# Patient Record
Sex: Male | Born: 1970 | Race: Black or African American | Hispanic: No | Marital: Married | Smoking: Never smoker
Health system: Southern US, Community
[De-identification: ages and names within clinical notes are randomized; demographics above are authoritative.]

## PROBLEM LIST (undated history)

## (undated) DIAGNOSIS — N4601 Organic azoospermia: Secondary | ICD-10-CM

## (undated) DIAGNOSIS — Z973 Presence of spectacles and contact lenses: Secondary | ICD-10-CM

## (undated) DIAGNOSIS — E119 Type 2 diabetes mellitus without complications: Secondary | ICD-10-CM

## (undated) DIAGNOSIS — E291 Testicular hypofunction: Secondary | ICD-10-CM

## (undated) DIAGNOSIS — I10 Essential (primary) hypertension: Secondary | ICD-10-CM

## (undated) HISTORY — PX: BREAST REDUCTION SURGERY: SHX8

---

## 2009-09-25 HISTORY — PX: COLONOSCOPY: SHX174

## 2011-05-01 DIAGNOSIS — E119 Type 2 diabetes mellitus without complications: Secondary | ICD-10-CM | POA: Insufficient documentation

## 2011-09-09 ENCOUNTER — Encounter: Payer: Self-pay | Admitting: Emergency Medicine

## 2011-09-09 ENCOUNTER — Emergency Department (HOSPITAL_COMMUNITY)
Admission: EM | Admit: 2011-09-09 | Discharge: 2011-09-09 | Disposition: A | Discharge status: Home / Self Care | Payer: BC Managed Care – PPO | Attending: Emergency Medicine | Admitting: Emergency Medicine

## 2011-09-09 DIAGNOSIS — M25519 Pain in unspecified shoulder: Secondary | ICD-10-CM | POA: Insufficient documentation

## 2011-09-09 DIAGNOSIS — IMO0001 Reserved for inherently not codable concepts without codable children: Secondary | ICD-10-CM | POA: Insufficient documentation

## 2011-09-09 DIAGNOSIS — Z862 Personal history of diseases of the blood and blood-forming organs and certain disorders involving the immune mechanism: Secondary | ICD-10-CM | POA: Insufficient documentation

## 2011-09-09 DIAGNOSIS — Z8639 Personal history of other endocrine, nutritional and metabolic disease: Secondary | ICD-10-CM | POA: Insufficient documentation

## 2011-09-09 DIAGNOSIS — I10 Essential (primary) hypertension: Secondary | ICD-10-CM | POA: Insufficient documentation

## 2011-09-09 DIAGNOSIS — E119 Type 2 diabetes mellitus without complications: Secondary | ICD-10-CM | POA: Insufficient documentation

## 2011-09-09 DIAGNOSIS — M546 Pain in thoracic spine: Secondary | ICD-10-CM | POA: Insufficient documentation

## 2011-09-09 DIAGNOSIS — M7918 Myalgia, other site: Secondary | ICD-10-CM

## 2011-09-09 HISTORY — DX: Essential (primary) hypertension: I10

## 2011-09-09 MED ORDER — IBUPROFEN 800 MG PO TABS
800.0000 mg | ORAL_TABLET | Freq: Once | ORAL | Status: AC
  Administered 2011-09-09: 800 mg via ORAL
  Filled 2011-09-09: qty 1

## 2011-09-09 MED ORDER — CYCLOBENZAPRINE HCL 10 MG PO TABS
10.0000 mg | ORAL_TABLET | Freq: Once | ORAL | Status: AC
  Administered 2011-09-09: 10 mg via ORAL
  Filled 2011-09-09: qty 1

## 2011-09-09 MED ORDER — OXYCODONE-ACETAMINOPHEN 5-325 MG PO TABS
1.0000 | ORAL_TABLET | Freq: Once | ORAL | Status: AC
  Administered 2011-09-09: 1 via ORAL
  Filled 2011-09-09: qty 1

## 2011-09-09 MED ORDER — CYCLOBENZAPRINE HCL 10 MG PO TABS: 10.0000 mg | ORAL_TABLET | Freq: Two times a day (BID) | ORAL | Status: AC | PRN

## 2011-09-09 NOTE — ED Provider Notes (Signed)
History     CSN: [id number] Arrival date & time: 09/09/2011 10:19 PM   First MD Initiated Contact with Patient 09/09/11 2232      Chief Complaint  Patient presents with  . Back Pain    (Consider location/radiation/quality/duration/timing/severity/associated sxs/prior treatment) Patient is a 40 y.o. male presenting with back pain. The history is provided by the patient. No language interpreter was used.  Back Pain  This is a new problem. The current episode started 12 to 24 hours ago. The problem occurs constantly. The problem has not changed since onset.Pain location: L scapula. The quality of the pain is described as cramping and aching. The pain does not radiate. The pain is at a severity of 7/10. The pain is moderate. The symptoms are aggravated by certain positions. The pain is the same all the time. Pertinent negatives include no chest pain, no fever, no numbness, no paresthesias, no paresis and no weakness. He has tried NSAIDs for the symptoms. Risk factors include obesity.   Reports muscle spasm to his left scapula upon awakening this morning. Has tried ibuprofen 800 mg and 1 narcotic pain pill no relief. Palpable muscle spasm to the area. ` Past Medical History  Diagnosis Date  . Hypertension   . Diabetes mellitus   . Obesity   . Gout     History reviewed. No pertinent past surgical history.  No family history on file.  History  Substance Use Topics  . Smoking status: Never Smoker   . Smokeless tobacco: Not on file  . Alcohol Use: No      Review of Systems  Constitutional: Negative for fever.  Cardiovascular: Negative for chest pain.  Musculoskeletal: Positive for back pain.  Neurological: Negative for weakness, numbness and paresthesias.  All other systems reviewed and are negative.    Allergies  Review of patient's allergies indicates no known allergies.  Home Medications  No current outpatient prescriptions on file.  BP 195/95  Pulse 86  Temp(Src)  98.5 F (36.9 C) (Oral)  Resp 12  SpO2 100%  Physical Exam  Nursing note and vitals reviewed. Constitutional: He appears well-developed and well-nourished.  Eyes: Pupils are equal, round, and reactive to light.  Neck: Normal range of motion.  Cardiovascular: Normal rate.   Pulmonary/Chest: Effort normal and breath sounds normal.  Abdominal: Soft.  Musculoskeletal: He exhibits tenderness. He exhibits no edema.       Muscle spasm to L scapula area  Skin: Skin is warm and dry. No rash noted. No erythema.  Psychiatric: He has a normal mood and affect.    ED Course  Procedures (including critical care time)  Labs Reviewed - No data to display No results found.   No diagnosis found.    MDM  Treated for musculoskeletal pain to L scapula with percocet, ice, flexeril rx and ibuprofen. Visible spasm.  Woke that way     Jethro Bastos, NP 09/10/11 [phone]

## 2011-09-09 NOTE — ED Notes (Signed)
I gave the patient a large ice pack. 

## 2011-09-09 NOTE — ED Notes (Signed)
I gave the patient a warm blanket. 

## 2011-09-09 NOTE — ED Notes (Signed)
PT. REPORTS LEFT UPPER BACK PAIN ONSET THIS MORNING , DENIES INJURY OR FALL , SLIGHT SOB .

## 2011-09-09 NOTE — ED Notes (Signed)
Pt reports waking up this morning with (L) mid-back pain and (L) shoulder pain.  Pt tender on palpation.  ROM intact.  Denies injury.  Skin warm, dry and intact.  Neuro intact.

## 2011-09-10 NOTE — ED Provider Notes (Signed)
Medical screening examination/treatment/procedure(s) were performed by non-physician practitioner and as supervising physician I was immediately available for consultation/collaboration.  Donnetta Hutching, MD 09/10/11 [phone]

## 2011-09-30 ENCOUNTER — Ambulatory Visit (INDEPENDENT_AMBULATORY_CARE_PROVIDER_SITE_OTHER): Payer: BC Managed Care – PPO

## 2011-09-30 DIAGNOSIS — R609 Edema, unspecified: Secondary | ICD-10-CM

## 2011-09-30 DIAGNOSIS — IMO0001 Reserved for inherently not codable concepts without codable children: Secondary | ICD-10-CM

## 2011-09-30 DIAGNOSIS — M109 Gout, unspecified: Secondary | ICD-10-CM

## 2012-02-05 DIAGNOSIS — M109 Gout, unspecified: Secondary | ICD-10-CM | POA: Insufficient documentation

## 2012-05-02 DIAGNOSIS — G629 Polyneuropathy, unspecified: Secondary | ICD-10-CM | POA: Insufficient documentation

## 2013-06-25 ENCOUNTER — Ambulatory Visit: Payer: BC Managed Care – PPO

## 2013-06-25 ENCOUNTER — Ambulatory Visit (INDEPENDENT_AMBULATORY_CARE_PROVIDER_SITE_OTHER): Payer: BC Managed Care – PPO | Admitting: Family Medicine

## 2013-06-25 VITALS — BP 128/78 | HR 71 | Temp 98.3°F | Resp 16 | Ht 71.0 in | Wt 302.0 lb

## 2013-06-25 DIAGNOSIS — M25432 Effusion, left wrist: Secondary | ICD-10-CM

## 2013-06-25 DIAGNOSIS — M25532 Pain in left wrist: Secondary | ICD-10-CM

## 2013-06-25 DIAGNOSIS — M25439 Effusion, unspecified wrist: Secondary | ICD-10-CM

## 2013-06-25 DIAGNOSIS — E876 Hypokalemia: Secondary | ICD-10-CM

## 2013-06-25 DIAGNOSIS — M25539 Pain in unspecified wrist: Secondary | ICD-10-CM

## 2013-06-25 LAB — BASIC METABOLIC PANEL WITH GFR
Chloride: 96 meq/L (ref 96–112)
Creat: 1.08 mg/dL (ref 0.50–1.35)
Potassium: 3.2 meq/L — ABNORMAL LOW (ref 3.5–5.3)

## 2013-06-25 LAB — BASIC METABOLIC PANEL
BUN: 15 mg/dL (ref 6–23)
CO2: 29 mEq/L (ref 19–32)
Calcium: 9.6 mg/dL (ref 8.4–10.5)
Glucose, Bld: 165 mg/dL — ABNORMAL HIGH (ref 70–99)
Sodium: 137 mEq/L (ref 135–145)

## 2013-06-25 LAB — URIC ACID: Uric Acid, Serum: 6.5 mg/dL (ref 4.0–7.8)

## 2013-06-25 MED ORDER — HYDROCODONE-ACETAMINOPHEN 5-325 MG PO TABS: 1.0000 | ORAL_TABLET | Freq: Four times a day (QID) | ORAL | Status: DC | PRN

## 2013-06-25 NOTE — Progress Notes (Signed)
[facility]:  Office Visit  Chief Complaint:  Chief Complaint  Patient presents with  . Hand Injury    injured left wrist on Monday now swollen and hurting    HPI: Robert Brennan is a 42 y.o. male who is here for left wrist pain When he was getting out of bed Monday he bent it wrong and felt a loud pop, this episode left a bruise on the top of his hand, near wrist , + swelling, he can move his fingers but not his wirst due to pain.   Has been taking ibuprofen for pain it helps some and used ice on it last night to help with swelling he does have a HX of Gout . History of gout on right elbow. He takes colchicine every day, He did have seafood last Saturday, fish and clams. Denies alcohol use.  No prior injuries, he is ambidextrous. No weakness, but does have tingling  Last HgbA1c was under 6 No steroid use Last gouty attack was 1 year ago  Past Medical History  Diagnosis Date  . Hypertension   . Diabetes mellitus   . Obesity   . Gout    History reviewed. No pertinent past surgical history. History   Social History  . Marital Status: Married    Spouse Name: N/A    Number of Children: N/A  . Years of Education: N/A   Social History Main Topics  . Smoking status: Never Smoker   . Smokeless tobacco: None  . Alcohol Use: No  . Drug Use: No  . Sexual Activity: None   Other Topics Concern  . None   Social History Narrative  . None   Family History  Problem Relation Age of Onset  . Arthritis Mother   . Hypertension Mother   . Cancer Father    No Known Allergies Prior to Admission medications   Medication Sig Start Date End Date Taking? Authorizing Provider  atenolol-chlorthalidone (TENORETIC) 100-25 MG per tablet Take 1 tablet by mouth daily.   Yes Historical Provider, MD  colchicine 0.6 MG tablet Take 0.6 mg by mouth daily. Take two tablets at beginning of flare up then one tablet every eight hours until flare up is gone   Yes Historical Provider, MD   glucose blood test strip 1 each by Other route as needed for other. Use as instructed   Yes Historical Provider, MD  insulin glargine (LANTUS) 100 UNIT/ML injection Inject into the skin at bedtime. 64 units BID   Yes Historical Provider, MD  metFORMIN (GLUCOPHAGE-XR) 750 MG 24 hr tablet Take 750 mg by mouth daily with breakfast. Two tablets with dinner   Yes Historical Provider, MD  potassium chloride (K-DUR,KLOR-CON) 10 MEQ tablet Take 10 mEq by mouth daily.   Yes Historical Provider, MD  tadalafil (CIALIS) 5 MG tablet Take 5 mg by mouth daily as needed for erectile dysfunction. Take 1/2 tablet per day   Yes Historical Provider, MD     ROS: The patient denies fevers, chills, night sweats, unintentional weight loss, chest pain, palpitations, wheezing, dyspnea on exertion, nausea, vomiting, abdominal pain, dysuria, hematuria, melena, numbness or weakness, +  does have some tingling in his fingers and swelling of hand and wrist  All other systems have been reviewed and were otherwise negative with the exception of those mentioned in the HPI and as above.    PHYSICAL EXAM: Filed Vitals:   06/25/13 0946  BP: 128/78  Pulse: 71  Temp: 98.3 F (  36.8 C)  Resp: 16   Filed Vitals:   06/25/13 0946  Height: 5\' 11"  (1.803 m)  Weight: 302 lb (136.986 kg)   Body mass index is 42.14 kg/(m^2).  General: Alert, no acute distress HEENT:  Normocephalic, atraumatic, oropharynx patent. EOMI, PERRLA Cardiovascular:  Regular rate and rhythm, no rubs murmurs or gallops.  No Carotid bruits, radial pulse intact. No pedal edema.  Respiratory: Clear to auscultation bilaterally.  No wheezes, rales, or rhonchi.  No cyanosis, no use of accessory musculature GI: No organomegaly, abdomen is soft and non-tender, positive bowel sounds.  No masses. Skin: No rashes. Neurologic: Facial musculature symmetric. Psychiatric: Patient is appropriate throughout our interaction. Lymphatic: No cervical  lymphadenopathy Musculoskeletal: Gait intact. Left wrist - + warmth, decrease ROM in all directions due to pain + swelling + bruise, tender at proximal wrist but not MCP.    LABS: No results found for this or any previous visit.   EKG/XRAY:   Primary read interpreted by Dr. Conley Rolls at [facility]. ? Lucency at 3rd and 4th base proximal phalanx  Shadow vs fracture No e/o styloid fx   ASSESSMENT/PLAN: Encounter Diagnosis  Name Primary?  . Left wrist pain Yes   Most likely gout, less likely fracture Will get uric acid Rx Norco prn pain Advise to take 1.2 mg of cochicine and see how he does,  If not helpful and uric acid level is elvevated then I will rx him steroids.  He will get wrist brace and sling for comfort.  Will recheck BMP since he has a history of hypokalemia eventhough on supplemental potassium  Gross sideeffects, risk and benefits, and alternatives of medications d/w patient. Patient is aware that all medications have potential sideeffects and we are unable to predict every sideeffect or drug-drug interaction that may occur.  LE, THAO PHUONG, DO 06/25/2013 10:25 AM

## 2014-08-19 ENCOUNTER — Ambulatory Visit (INDEPENDENT_AMBULATORY_CARE_PROVIDER_SITE_OTHER): Payer: BC Managed Care – PPO | Admitting: Family Medicine

## 2014-08-19 ENCOUNTER — Ambulatory Visit (INDEPENDENT_AMBULATORY_CARE_PROVIDER_SITE_OTHER): Payer: BC Managed Care – PPO

## 2014-08-19 VITALS — BP 132/80 | HR 87 | Temp 98.9°F | Resp 18 | Ht 72.0 in | Wt 302.0 lb

## 2014-08-19 DIAGNOSIS — M25561 Pain in right knee: Secondary | ICD-10-CM

## 2014-08-19 MED ORDER — MELOXICAM 7.5 MG PO TABS: 7.5000 mg | ORAL_TABLET | Freq: Every day | ORAL | Status: DC

## 2014-08-19 NOTE — Patient Instructions (Signed)
Your xray of your right knee looked ok. It did show some arthritis in your knee.  You did not tear any of your 4 knee ligaments. It's possible you tore your medial meniscus a little bit.  Please wear the brace for the next 7-10 days. We've prescribed mobic which is a long acting anti inflammatory. Please take this once daily. If you are not feeling better in 7-10 days, please come back to clinic to be evaluated again. We would probably either schedule an appt with ortho or schedule you for an mri from here.

## 2014-08-19 NOTE — Progress Notes (Signed)
Subjective:    Patient ID: Robert Brennan, male    DOB: 07/05/1971, 43 y.o.   MRN: [id number]  PCP: No primary care provider on file.  Chief Complaint  Patient presents with  . Knee Pain    rt-1.5 weeks    There are no active problems to display for this patient.  Prior to Admission medications   Medication Sig Start Date End Date Taking? Authorizing Provider  amLODipine-benazepril (LOTREL) 10-40 MG per capsule Take by mouth. 07/31/14  Yes Historical Provider, MD  aspirin 81 MG chewable tablet Chew by mouth. 12/01/09  Yes Historical Provider, MD  atenolol-chlorthalidone (TENORETIC) 100-25 MG per tablet Take 1 tablet by mouth daily.   Yes Historical Provider, MD  colchicine 0.6 MG tablet Take 0.6 mg by mouth daily. Take two tablets at beginning of flare up then one tablet every eight hours until flare up is gone   Yes Historical Provider, MD  glucose blood test strip 1 each by Other route as needed for other. Use as instructed   Yes Historical Provider, MD  insulin glargine (LANTUS) 100 UNIT/ML injection Inject into the skin at bedtime. 64 units BID   Yes Historical Provider, MD  metFORMIN (GLUCOPHAGE-XR) 750 MG 24 hr tablet Take 750 mg by mouth daily with breakfast. Two tablets with dinner   Yes Historical Provider, MD  potassium chloride (K-DUR,KLOR-CON) 10 MEQ tablet Take 10 mEq by mouth daily.   Yes Historical Provider, MD  tadalafil (CIALIS) 5 MG tablet Take 5 mg by mouth daily as needed for erectile dysfunction. Take 1/2 tablet per day   Yes Historical Provider, MD  allopurinol (ZYLOPRIM) 300 MG tablet Take by mouth. 12/12/13   Historical Provider, MD   Medications, allergies, past medical history, surgical history, family history, social history and problem list reviewed and updated.  HPI  [age] yom with PMH gout in right wrist last year presents with 1.5 wk h/o right knee pain.  Pain started 1.5 wks ago. He is a Runner, broadcasting/film/videoteacher, was walking at work and tripped going up stairs and landed on  medial right knee. It hurt initially but resolved quickly. A couple days later he noticed some medial swelling. He took ibuprofen which helped. Pain has persisted since the fall. It is typically pretty mild but when he walks or lies down to sleep and stretches it out the pain worsens. 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 He thinks he may have also twisted the right knee a bit while wrestling a few days ago.   No fevers, chills. No prev knee hx.   Review of Systems No CP, SOB.     Objective:   Physical Exam  Constitutional: He appears well-developed and well-nourished.  Non-toxic appearance. He does not have a sickly appearance. He does not appear ill. No distress.  BP 132/80 mmHg  Pulse 87  Temp(Src) 98.9 F (37.2 C) (Oral)  Resp 18  Ht 6' (1.829 m)  Wt 302 lb (136.986 kg)  BMI 40.95 kg/m2  SpO2 98%   Musculoskeletal:       Right knee: He exhibits swelling, effusion and abnormal meniscus. He exhibits normal range of motion, no ecchymosis, no LCL laxity and no MCL laxity. Tenderness found. Medial joint line tenderness noted.  TTP medial joint line. Slight swelling medial joint line. Crepitus with knee extension. Medial effusion. Neg mcl/lcl laxity. Neg ant/post drawer. Medial pain with McMurrays. Normal sensation. Normal strength. 2+  DP pulse. No erythema. No warmth.    [facility] reading (PRIMARY) by  Dr. Neva SeatGreene. Findings: Degenerative changes medial greater than lateral. No apparent fx.      Assessment & Plan:   [age] yom with PMH gout in right wrist last year presents with 1.5 wk h/o right knee pain.  Right medial knee pain - Plan: DG Knee Complete 4 Views Right, meloxicam (MOBIC) 7.5 MG tablet --OA on xr --Neg testing for ligament damage --Pain could be related to OA flare after fall, knee contusion, or possible meniscus tear in light of positive testing --Brace applied, mobic, rest 7-10 days --If these measures don't help pain, rtc  7-10 days. MRI vs ortho referral  Donnajean Lopesodd M. Nikia Levels, PA-C Physician Assistant-Certified [facility]  08/19/2014 6:53 PM

## 2014-08-20 NOTE — Progress Notes (Signed)
Xray read and patient discussed as well as examined with Mr. Agustin CreeMcVeigh. Agree with assessment and plan of care per his note.

## 2015-08-15 ENCOUNTER — Encounter: Payer: Self-pay | Admitting: Emergency Medicine

## 2015-08-15 ENCOUNTER — Emergency Department
Admission: EM | Admit: 2015-08-15 | Discharge: 2015-08-15 | Disposition: A | Discharge status: Home / Self Care | Payer: BLUE CROSS/BLUE SHIELD | Attending: Emergency Medicine | Admitting: Emergency Medicine

## 2015-08-15 DIAGNOSIS — Z794 Long term (current) use of insulin: Secondary | ICD-10-CM | POA: Insufficient documentation

## 2015-08-15 DIAGNOSIS — R350 Frequency of micturition: Secondary | ICD-10-CM | POA: Insufficient documentation

## 2015-08-15 DIAGNOSIS — Z7982 Long term (current) use of aspirin: Secondary | ICD-10-CM | POA: Insufficient documentation

## 2015-08-15 DIAGNOSIS — Z791 Long term (current) use of non-steroidal anti-inflammatories (NSAID): Secondary | ICD-10-CM | POA: Diagnosis not present

## 2015-08-15 DIAGNOSIS — Z79899 Other long term (current) drug therapy: Secondary | ICD-10-CM | POA: Diagnosis not present

## 2015-08-15 DIAGNOSIS — E1165 Type 2 diabetes mellitus with hyperglycemia: Secondary | ICD-10-CM | POA: Insufficient documentation

## 2015-08-15 DIAGNOSIS — I10 Essential (primary) hypertension: Secondary | ICD-10-CM | POA: Insufficient documentation

## 2015-08-15 LAB — URINALYSIS COMPLETE WITH MICROSCOPIC (ARMC ONLY)
BILIRUBIN URINE: NEGATIVE
Bacteria, UA: NONE SEEN
HGB URINE DIPSTICK: NEGATIVE
Leukocytes, UA: NEGATIVE
NITRITE: NEGATIVE
Protein, ur: NEGATIVE mg/dL
RBC / HPF: NONE SEEN RBC/hpf (ref 0–5)
SPECIFIC GRAVITY, URINE: 1.029 (ref 1.005–1.030)
pH: 6 (ref 5.0–8.0)

## 2015-08-15 LAB — CBC
HCT: 46.1 % (ref 40.0–52.0)
Hemoglobin: 15.6 g/dL (ref 13.0–18.0)
MCH: 29.1 pg (ref 26.0–34.0)
MCHC: 33.8 g/dL (ref 32.0–36.0)
MCV: 85.9 fL (ref 80.0–100.0)
PLATELETS: 191 10*3/uL (ref 150–440)
RBC: 5.36 MIL/uL (ref 4.40–5.90)
RDW: 12.6 % (ref 11.5–14.5)
WBC: 6.3 10*3/uL (ref 3.8–10.6)

## 2015-08-15 LAB — GLUCOSE, CAPILLARY
GLUCOSE-CAPILLARY: 555 mg/dL — AB (ref 65–99)
GLUCOSE-CAPILLARY: 337 mg/dL — AB (ref 65–99)
Glucose-Capillary: 363 mg/dL — ABNORMAL HIGH (ref 65–99)

## 2015-08-15 LAB — BASIC METABOLIC PANEL
ANION GAP: 12 (ref 5–15)
BUN: 20 mg/dL (ref 6–20)
CALCIUM: 9.3 mg/dL (ref 8.9–10.3)
CO2: 25 mmol/L (ref 22–32)
CREATININE: 1.31 mg/dL — AB (ref 0.61–1.24)
Chloride: 92 mmol/L — ABNORMAL LOW (ref 101–111)
GFR calc Af Amer: >60 mL/min (ref >60)
GLUCOSE: 596 mg/dL — AB (ref 65–99)
Potassium: 3.5 mmol/L (ref 3.5–5.1)
Sodium: 129 mmol/L — ABNORMAL LOW (ref 135–145)

## 2015-08-15 MED ORDER — INSULIN ASPART 100 UNIT/ML ~~LOC~~ SOLN
6.0000 [IU] | Freq: Once | SUBCUTANEOUS | Status: AC
  Administered 2015-08-15: 6 [IU] via INTRAVENOUS
  Filled 2015-08-15: qty 6

## 2015-08-15 MED ORDER — SODIUM CHLORIDE 0.9 % IV BOLUS (SEPSIS)
1000.0000 mL | Freq: Once | INTRAVENOUS | Status: AC
  Administered 2015-08-15: 1000 mL via INTRAVENOUS

## 2015-08-15 MED ORDER — GLIPIZIDE 10 MG PO TABS: 10.0000 mg | ORAL_TABLET | Freq: Every day | ORAL | Status: DC

## 2015-08-15 NOTE — ED Provider Notes (Signed)
[facility] Emergency Department Provider Note REMINDER - THIS NOTE IS NOT A FINAL MEDICAL RECORD UNTIL IT IS SIGNED. UNTIL THEN, THE CONTENT BELOW MAY REFLECT INFORMATION FROM A DOCUMENTATION TEMPLATE, NOT THE ACTUAL PATIENT VISIT. ____________________________________________  Time seen: Approximately 4:41 PM  I have reviewed the triage vital signs and the nursing notes.   HISTORY  Chief Complaint Hyperglycemia    HPI Treyce Spillers is a 44 y.o. male history type 2 diabetes, hypertension.  Patient reports that he is on metformin, but he ran out of prescription for glipizide 10 mg about 3 weeks ago because he had not seen his primary care doctor recently enough to have it refilled. Since that his blood sugars during the day of progressively increased to the point there are now registering "high" at home the last 2 days. Reports for the last 3 or so days he has been urinating frequently and feeling "dehydrated". He denies any fevers or chills. No chest pain or trouble breathing. No rash. No headache or confusion. He denies being in any pain.  He has set up appointment with his primary care doctor for tomorrow in follow-up.   Past Medical History  Diagnosis Date  . Hypertension   . Diabetes mellitus   . Obesity   . Gout     There are no active problems to display for this patient.   History reviewed. No pertinent past surgical history.  Current Outpatient Rx  Name  Route  Sig  Dispense  Refill  . allopurinol (ZYLOPRIM) 300 MG tablet   Oral   Take by mouth.         Marland Kitchen amLODipine-benazepril (LOTREL) 10-40 MG per capsule   Oral   Take by mouth.         Marland Kitchen aspirin 81 MG chewable tablet   Oral   Chew by mouth.         Marland Kitchen atenolol-chlorthalidone (TENORETIC) 100-25 MG per tablet   Oral   Take 1 tablet by mouth daily.         . colchicine 0.6 MG tablet   Oral   Take 0.6 mg by mouth daily. Take two tablets at beginning of flare up then one tablet  every eight hours until flare up is gone         . glipiZIDE (GLUCOTROL) 10 MG tablet   Oral   Take 1 tablet (10 mg total) by mouth daily.   14 tablet   0   . glucose blood test strip   Other   1 each by Other route as needed for other. Use as instructed         . insulin glargine (LANTUS) 100 UNIT/ML injection   Subcutaneous   Inject into the skin at bedtime. 64 units BID         . meloxicam (MOBIC) 7.5 MG tablet   Oral   Take 1 tablet (7.5 mg total) by mouth daily.   30 tablet   0   . metFORMIN (GLUCOPHAGE-XR) 750 MG 24 hr tablet   Oral   Take 750 mg by mouth daily with breakfast. Two tablets with dinner         . potassium chloride (K-DUR,KLOR-CON) 10 MEQ tablet   Oral   Take 10 mEq by mouth daily.         . tadalafil (CIALIS) 5 MG tablet   Oral   Take 5 mg by mouth daily as needed for erectile dysfunction. Take 1/2 tablet per day  Allergies Review of patient's allergies indicates no known allergies.  Family History  Problem Relation Age of Onset  . Arthritis Mother   . Hypertension Mother   . Cancer Father     Social History Social History  Substance Use Topics  . Smoking status: Never Smoker   . Smokeless tobacco: None  . Alcohol Use: No    Review of Systems Constitutional: No fever/chills Eyes: No visual changes. ENT: No sore throat. Cardiovascular: Denies chest pain. Respiratory: Denies shortness of breath. Gastrointestinal: No abdominal pain.  No nausea, no vomiting.  No diarrhea.  No constipation. Genitourinary: Negative for dysuria. He is urinating more frequently than usual. Musculoskeletal: Negative for back pain. Skin: Negative for rash. Neurological: Negative for headaches, focal weakness or numbness.  10-point ROS otherwise negative.  ____________________________________________   PHYSICAL EXAM:  VITAL SIGNS: ED Triage Vitals  Enc Vitals Group     BP 08/15/15 1331 141/80 mmHg     Pulse Rate 08/15/15 1330  100     Resp 08/15/15 1330 20     Temp 08/15/15 1331 98.8 F (37.1 C)     Temp Source 08/15/15 1331 Oral     SpO2 08/15/15 1330 93 %     Weight 08/15/15 1331 260 lb (117.935 kg)     Height 08/15/15 1330 6' (1.829 m)     Head Cir --      Peak Flow --      Pain Score --      Pain Loc --      Pain Edu? --      Excl. in GC? --    Constitutional: Alert and oriented. Well appearing and in no acute distress. Eyes: Conjunctivae are normal. PERRL. EOMI. Head: Atraumatic. Nose: No congestion/rhinnorhea. Mouth/Throat: Mucous membranes are moist.  Oropharynx non-erythematous. Neck: No stridor.   Cardiovascular: Normal rate, regular rhythm. Grossly normal heart sounds.  Good peripheral circulation. Respiratory: Normal respiratory effort.  No retractions. Lungs CTAB. Gastrointestinal: Soft and nontender. No distention. No abdominal bruits. No CVA tenderness. Musculoskeletal: No lower extremity tenderness nor edema.  No joint effusions. Neurologic:  Normal speech and language. No gross focal neurologic deficits are appreciated. No gait instability. Skin:  Skin is warm, dry and intact. No rash noted. Psychiatric: Mood and affect are normal. Speech and behavior are normal.  ____________________________________________   LABS (all labs ordered are listed, but only abnormal results are displayed)  Labs Reviewed  BASIC METABOLIC PANEL - Abnormal; Notable for the following:    Sodium 129 (*)    Chloride 92 (*)    Glucose, Bld 596 (*)    Creatinine, Ser 1.31 (*)    All other components within normal limits  URINALYSIS COMPLETEWITH MICROSCOPIC ([facility] ONLY) - Abnormal; Notable for the following:    Color, Urine STRAW (*)    APPearance CLEAR (*)    Glucose, UA >500 (*)    Ketones, ur TRACE (*)    Squamous Epithelial / LPF 0-5 (*)    All other components within normal limits  GLUCOSE, CAPILLARY - Abnormal; Notable for the following:    Glucose-Capillary 555 (*)    All other components within  normal limits  GLUCOSE, CAPILLARY - Abnormal; Notable for the following:    Glucose-Capillary 363 (*)    All other components within normal limits  CBC  CBG MONITORING, ED  CBG MONITORING, ED   ____________________________________________  EKG   ____________________________________________  RADIOLOGY   ____________________________________________   PROCEDURES  Procedure(s) performed: None  Critical Care  performed: No  ____________________________________________   INITIAL IMPRESSION / ASSESSMENT AND PLAN / ED COURSE  Pertinent labs & imaging results that were available during my care of the patient were reviewed by me and considered in my medical decision making (see chart for details).  Patient presents for hyperglycemia with polyuria and polydipsia. No signs or symptoms of tablet ketoacidosis or HHNK. Appears to be due to running out of his glipizide which she was unable to have his prescription refilled, but has set up follow-up for tomorrow with his doctor. He is in no distress, no evidence of acute infection. Neurologic intact. No cardia pulmonary symptoms. We will hydrate him, improve his sugar, and place him back on his glipizide. Once blood sugars below 300, as he has no anion gap elevation, we will plan to discharge the patient to have him follow up closely with his doctor. I will provide him a prescription for his glipizide for the next 2 weeks should he have any trouble getting to his doctor.  Patient understands the importance of follow-up, and careful return precautions. Dr. Lenard LancePaduchowski will follow-up as we continue to improve his blood sugar. Anticipate discharge to home once glucose improved. ____________________________________________   FINAL CLINICAL IMPRESSION(S) / ED DIAGNOSES  Final diagnoses:  Type 2 diabetes mellitus with hyperglycemia, without long-term current use of insulin (HCC)      Sharyn CreamerMark Hawkin Charo, MD 08/15/15 1645

## 2015-08-15 NOTE — ED Provider Notes (Signed)
-----------------------------------------   5:39 PM on 08/15/2015 -----------------------------------------  Patient's blood glucose currently 337. States he feels very well, wishes to go home at this time. We'll discharge the patient home with a short course of glipizide 10 mg tablets which the patient has been taking up until the last several weeks. Patient has a follow-up appointment tomorrow with his primary care doctor per patient. I discussed return precautions to which they're agreeable. We'll discharge him at this time.  Minna AntisKevin Devlin Mcveigh, MD 08/15/15 1739

## 2015-08-15 NOTE — ED Notes (Signed)
Pt reports high blood sugar X 1 week. Pt taken off of insulin pen and glipizide; has follow up with doctor this week. Pt currently taking metformin. Pt only sx are of excessive thirst. No vision changes, increase in urination.

## 2015-08-15 NOTE — ED Notes (Signed)
Patient resting comfortably. IV saline locked. Family at bedside. Will recheck sugar at 1715.

## 2015-08-15 NOTE — ED Notes (Signed)
Pt presents with c/o high blood sugar this am around 9, it was 448. Pt has appt tomorrow for med change but thought he should come in today.

## 2015-08-15 NOTE — Discharge Instructions (Signed)
Follow-up with your doctor tomorrow as you have already scheduled. Return to the emergency room right away if you develop confusion, nausea or vomiting, fever, weakness, feel dehydrated, or other new concerns arise.  Type 2 Diabetes Mellitus, Adult Type 2 diabetes mellitus, often simply referred to as type 2 diabetes, is a long-lasting (chronic) disease. In type 2 diabetes, the pancreas does not make enough insulin (a hormone), the cells are less responsive to the insulin that is made (insulin resistance), or both. Normally, insulin moves sugars from food into the tissue cells. The tissue cells use the sugars for energy. The lack of insulin or the lack of normal response to insulin causes excess sugars to build up in the blood instead of going into the tissue cells. As a result, high blood sugar (hyperglycemia) develops. The effect of high sugar (glucose) levels can cause many complications. Type 2 diabetes was also previously called adult-onset diabetes, but it can occur at any age.  RISK FACTORS  A person is predisposed to developing type 2 diabetes if someone in the family has the disease and also has one or more of the following primary risk factors:  Weight gain, or being overweight or obese.  An inactive lifestyle.  A history of consistently eating high-calorie foods. Maintaining a normal weight and regular physical activity can reduce the chance of developing type 2 diabetes. SYMPTOMS  A person with type 2 diabetes may not show symptoms initially. The symptoms of type 2 diabetes appear slowly. The symptoms include:  Increased thirst (polydipsia).  Increased urination (polyuria).  Increased urination during the night (nocturia).  Sudden or unexplained weight changes.  Frequent, recurring infections.  Tiredness (fatigue).  Weakness.  Vision changes, such as blurred vision.  Fruity smell to your breath.  Abdominal pain.  Nausea or vomiting.  Cuts or bruises which are  slow to heal.  Tingling or numbness in the hands or feet.  An open skin wound (ulcer). DIAGNOSIS Type 2 diabetes is frequently not diagnosed until complications of diabetes are present. Type 2 diabetes is diagnosed when symptoms or complications are present and when blood glucose levels are increased. Your blood glucose level may be checked by one or more of the following blood tests:  A fasting blood glucose test. You will not be allowed to eat for at least 8 hours before a blood sample is taken.  A random blood glucose test. Your blood glucose is checked at any time of the day regardless of when you ate.  A hemoglobin A1c blood glucose test. A hemoglobin A1c test provides information about blood glucose control over the previous 3 months.  An oral glucose tolerance test (OGTT). Your blood glucose is measured after you have not eaten (fasted) for 2 hours and then after you drink a glucose-containing beverage. TREATMENT   You may need to take insulin or diabetes medicine daily to keep blood glucose levels in the desired range.  If you use insulin, you may need to adjust the dosage depending on the carbohydrates that you eat with each meal or snack.  Lifestyle changes are recommended as part of your treatment. These may include:  Following an individualized diet plan developed by a nutritionist or dietitian.  Exercising daily. Your health care providers will set individualized treatment goals for you based on your age, your medicines, how long you have had diabetes, and any other medical conditions you have. Generally, the goal of treatment is to maintain the following blood glucose levels:  Before meals (preprandial):  80-130 mg/dL.  After meals (postprandial): below 180 mg/dL.  A1c: less than 6.5-7%. HOME CARE INSTRUCTIONS   Have your hemoglobin A1c level checked twice a year.  Perform daily blood glucose monitoring as directed by your health care provider.  Monitor urine  ketones when you are ill and as directed by your health care provider.  Take your diabetes medicine or insulin as directed by your health care provider to maintain your blood glucose levels in the desired range.  Never run out of diabetes medicine or insulin. It is needed every day.  If you are using insulin, you may need to adjust the amount of insulin given based on your intake of carbohydrates. Carbohydrates can raise blood glucose levels but need to be included in your diet. Carbohydrates provide vitamins, minerals, and fiber which are an essential part of a healthy diet. Carbohydrates are found in fruits, vegetables, whole grains, dairy products, legumes, and foods containing added sugars.  Eat healthy foods. You should make an appointment to see a registered dietitian to help you create an eating plan that is right for you.  Lose weight if you are overweight.  Carry a medical alert card or wear your medical alert jewelry.  Carry a 15-gram carbohydrate snack with you at all times to treat low blood glucose (hypoglycemia). Some examples of 15-gram carbohydrate snacks include:  Glucose tablets, 3 or 4.  Glucose gel, 15-gram tube.  Raisins, 2 tablespoons (24 grams).  Jelly beans, 6.  Animal crackers, 8.  Regular pop, 4 ounces (120 mL).  Gummy treats, 9.  Recognize hypoglycemia. Hypoglycemia occurs with blood glucose levels of 70 mg/dL and below. The risk for hypoglycemia increases when fasting or skipping meals, during or after intense exercise, and during sleep. Hypoglycemia symptoms can include:  Tremors or shakes.  Decreased ability to concentrate.  Sweating.  Increased heart rate.  Headache.  Dry mouth.  Hunger.  Irritability.  Anxiety.  Restless sleep.  Altered speech or coordination.  Confusion.  Treat hypoglycemia promptly. If you are alert and able to safely swallow, follow the 15:15 rule:  Take 15-20 grams of rapid-acting glucose or carbohydrate.  Rapid-acting options include glucose gel, glucose tablets, or 4 ounces (120 mL) of fruit juice, regular soda, or low-fat milk.  Check your blood glucose level 15 minutes after taking the glucose.  Take 15-20 grams more of glucose if the repeat blood glucose level is still 70 mg/dL or below.  Eat a meal or snack within 1 hour once blood glucose levels return to normal.  Be alert to feeling very thirsty and urinating more frequently than usual, which are early signs of hyperglycemia. An early awareness of hyperglycemia allows for prompt treatment. Treat hyperglycemia as directed by your health care provider.  Engage in at least 150 minutes of moderate-intensity physical activity a week, spread over at least 3 days of the week or as directed by your health care provider. In addition, you should engage in resistance exercise at least 2 times a week or as directed by your health care provider. Try to spend no more than 90 minutes at one time inactive.  Adjust your medicine and food intake as needed if you start a new exercise or sport.  Follow your sick-day plan anytime you are unable to eat or drink as usual.  Do not use any tobacco products including cigarettes, chewing tobacco, or electronic cigarettes. If you need help quitting, ask your health care provider.  Limit alcohol intake to no more than 1  drink per day for nonpregnant women and 2 drinks per day for men. You should drink alcohol only when you are also eating food. Talk with your health care provider whether alcohol is safe for you. Tell your health care provider if you drink alcohol several times a week.  Keep all follow-up visits as directed by your health care provider. This is important.  Schedule an eye exam soon after the diagnosis of type 2 diabetes and then annually.  Perform daily skin and foot care. Examine your skin and feet daily for cuts, bruises, redness, nail problems, bleeding, blisters, or sores. A foot exam by a  health care provider should be done annually.  Brush your teeth and gums at least twice a day and floss at least once a day. Follow up with your dentist regularly.  Share your diabetes management plan with your workplace or school.  Keep your immunizations up to date. It is recommended that you receive a flu (influenza) vaccine every year. It is also recommended that you receive a pneumonia (pneumococcal) vaccine. If you are 59 years of age or older and have never received a pneumonia vaccine, this vaccine may be given as a series of two separate shots. Ask your health care provider which additional vaccines may be recommended.  Learn to manage stress.  Obtain ongoing diabetes education and support as needed.  Participate in or seek rehabilitation as needed to maintain or improve independence and quality of life. Request a physical or occupational therapy referral if you are having foot or hand numbness, or difficulties with grooming, dressing, eating, or physical activity. SEEK MEDICAL CARE IF:   You are unable to eat food or drink fluids for more than 6 hours.  You have nausea and vomiting for more than 6 hours.  Your blood glucose level is over 240 mg/dL.  There is a change in mental status.  You develop an additional serious illness.  You have diarrhea for more than 6 hours.  You have been sick or have had a fever for a couple of days and are not getting better.  You have pain during any physical activity.  SEEK IMMEDIATE MEDICAL CARE IF:  You have difficulty breathing.  You have moderate to large ketone levels.   This information is not intended to replace advice given to you by your health care provider. Make sure you discuss any questions you have with your health care provider.   Document Released: 09/11/2005 Document Revised: 06/02/2015 Document Reviewed: 04/09/2012 Elsevier Interactive Patient Education [employer]

## 2016-01-10 IMAGING — CR DG KNEE COMPLETE 4+V*R*
4 series · 4 of 4 positions shown · non-contrast
Comparison: None.

CLINICAL DATA: Right medial knee pain Comments: Was walking at work
and tripped going up stairs and landed on right knee x 1 [DATE] week
ago

EXAM:
RIGHT KNEE - COMPLETE 4+ VIEW

[AP]
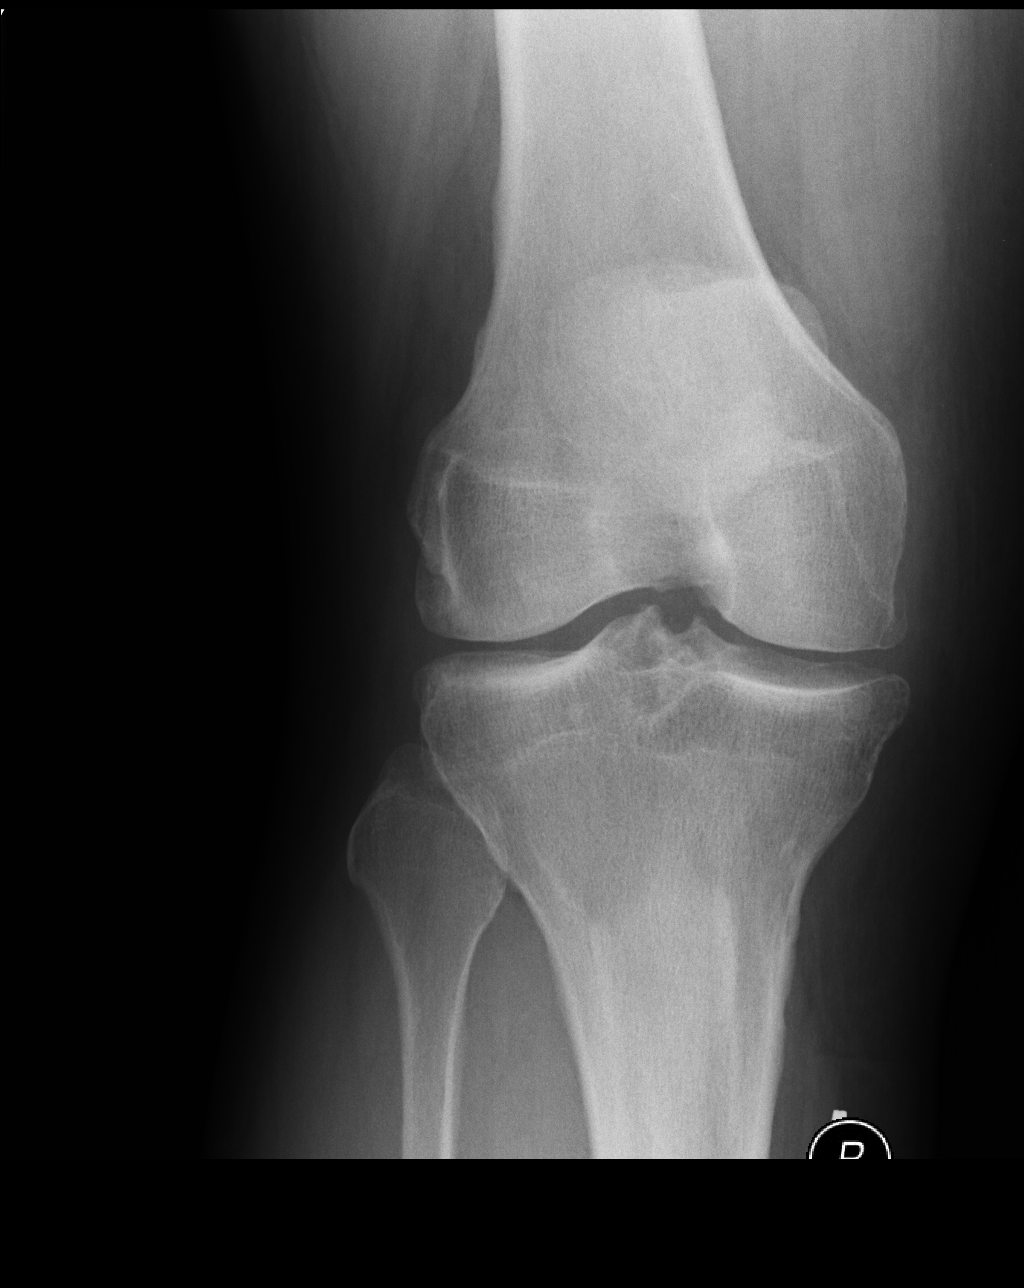

[lateral]
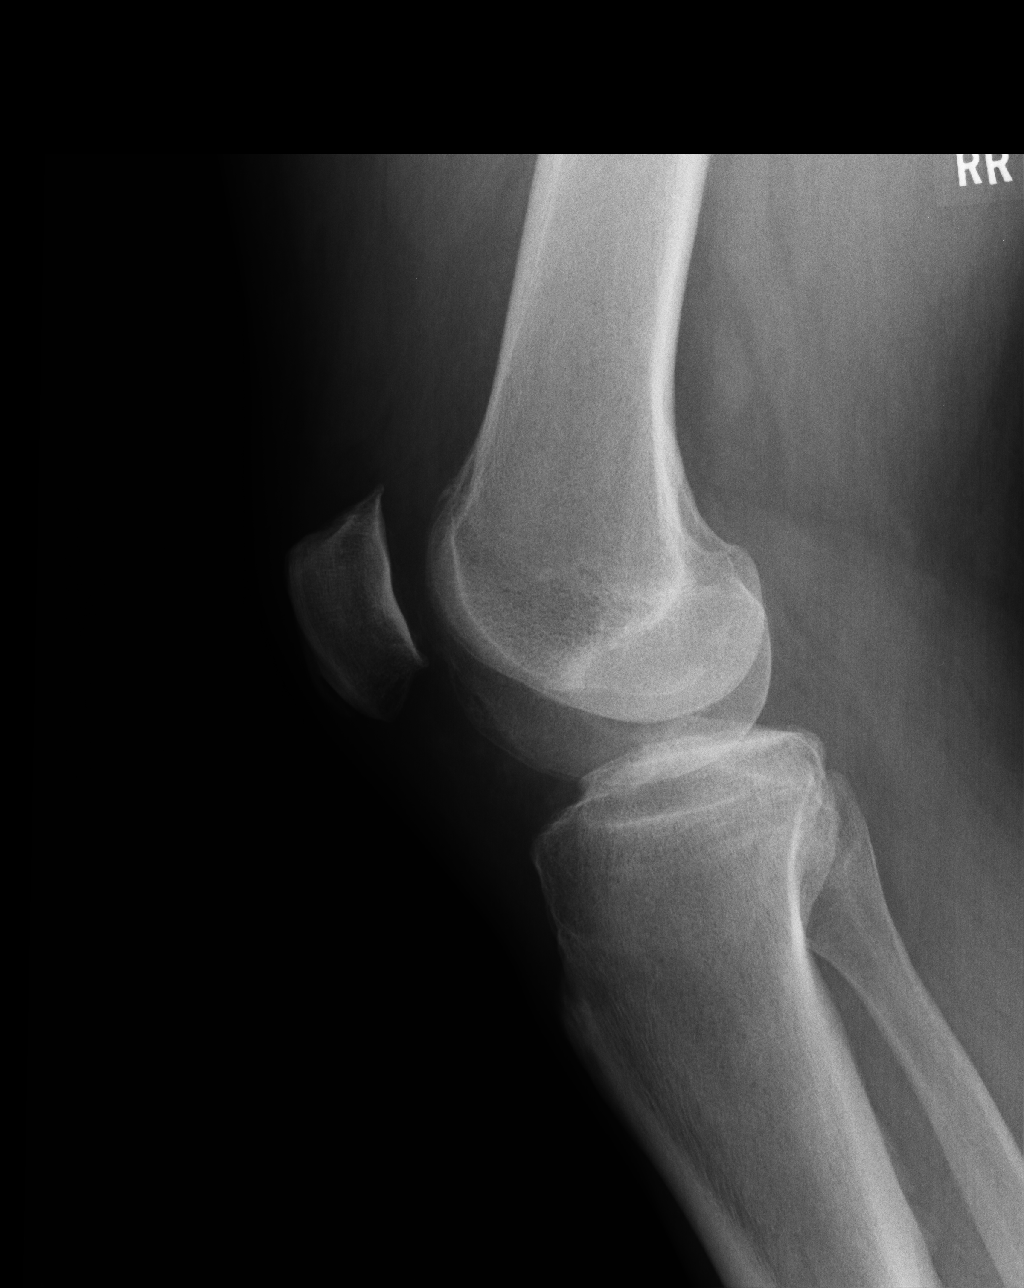

[ap axial]
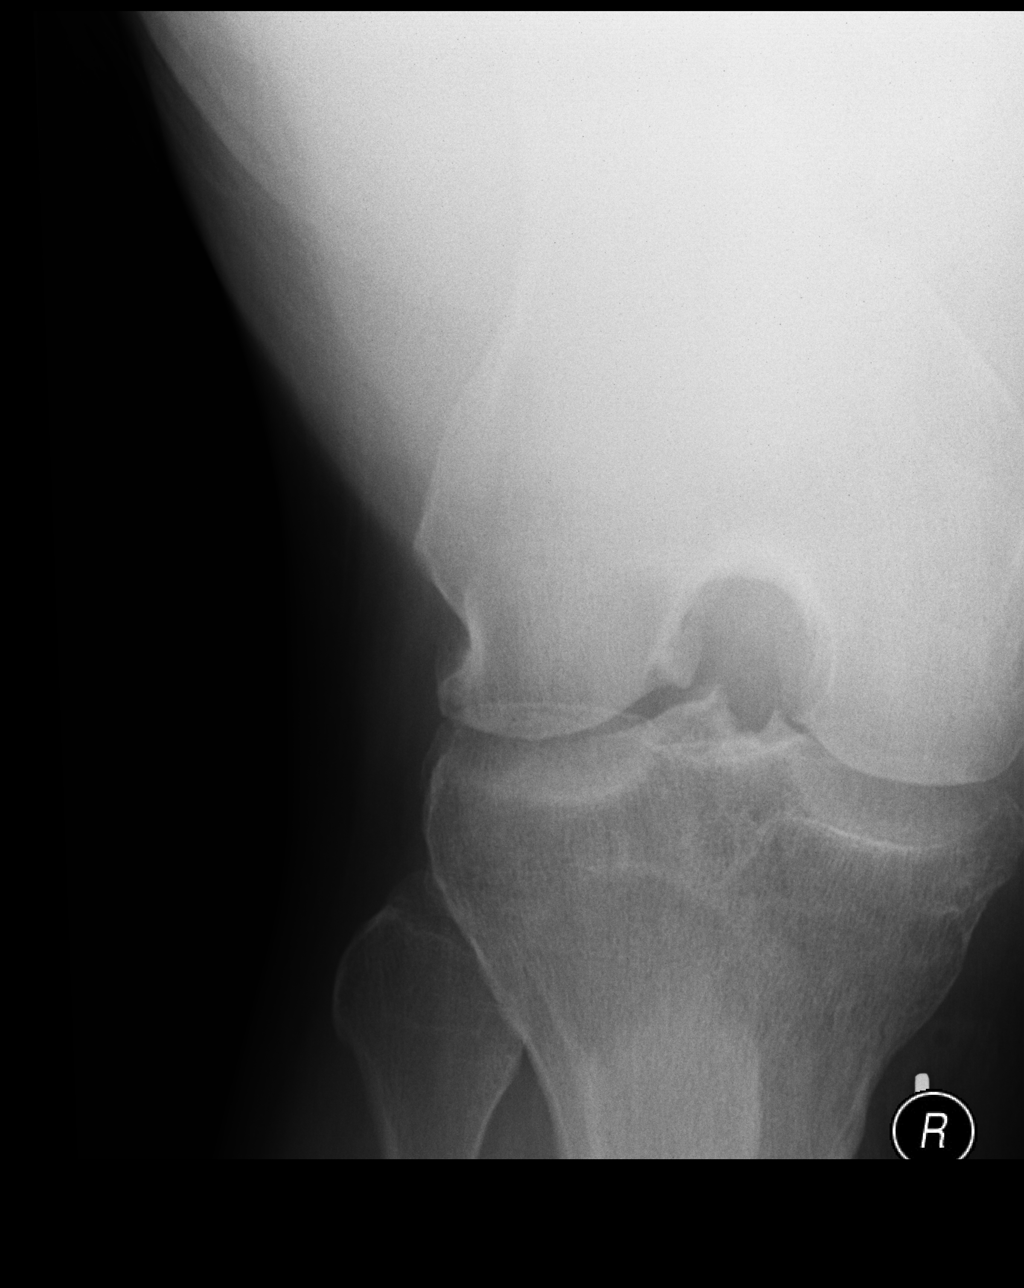

[sunrise]
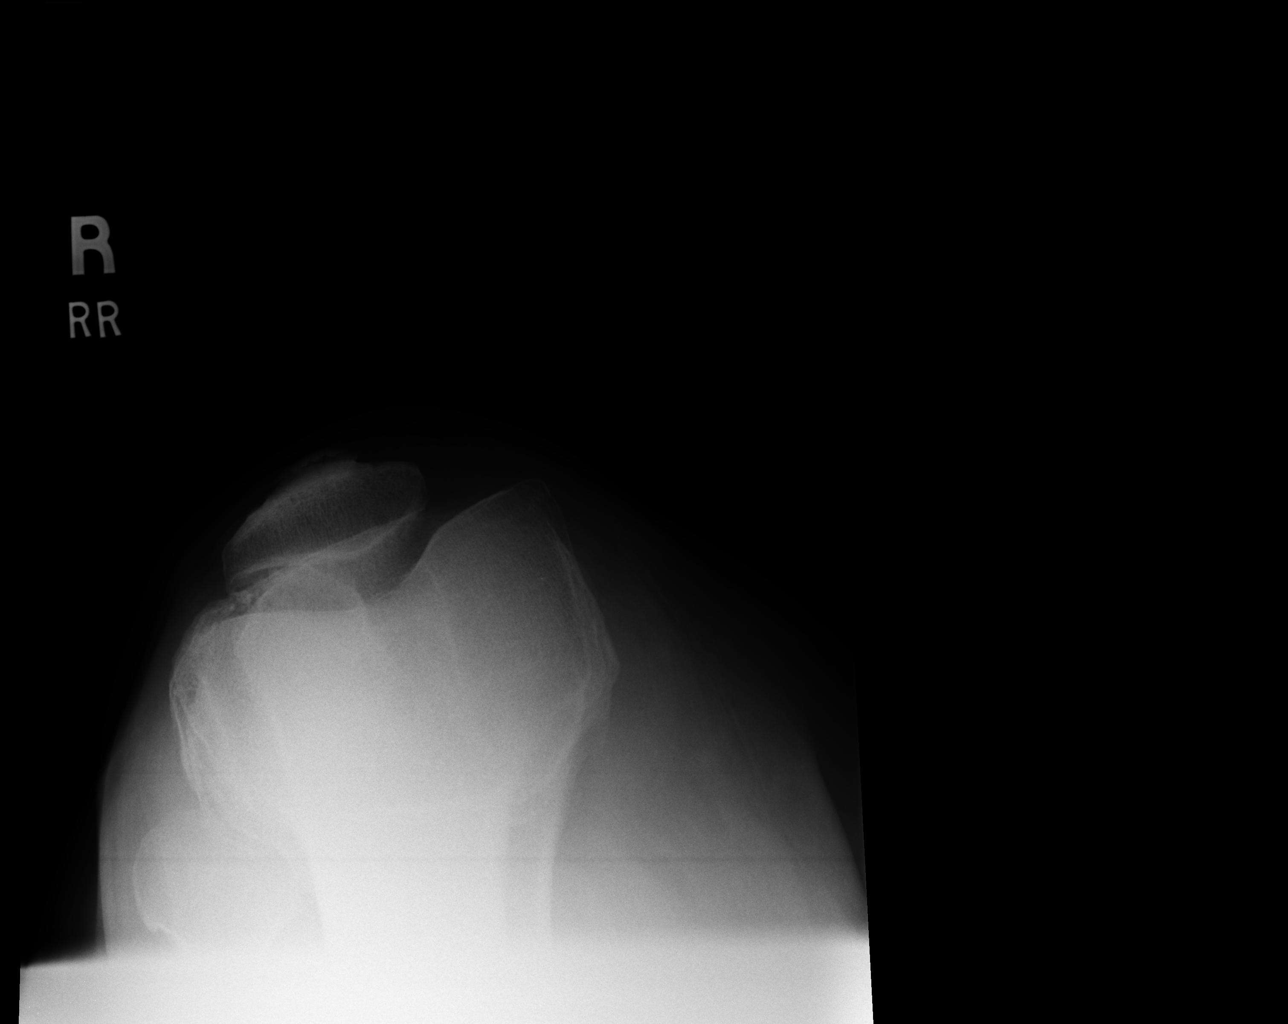

[4 of 4 positions shown; findings below may reference images not displayed]

FINDINGS: No fracture of the proximal tibia or distal femur. Patella is
normal. No joint effusion.
IMPRESSION: No acute osseous abnormality.

## 2016-03-09 ENCOUNTER — Other Ambulatory Visit: Payer: Self-pay | Admitting: Urology

## 2016-03-23 ENCOUNTER — Encounter (HOSPITAL_BASED_OUTPATIENT_CLINIC_OR_DEPARTMENT_OTHER): Payer: Self-pay | Admitting: *Deleted

## 2016-03-24 ENCOUNTER — Encounter (HOSPITAL_BASED_OUTPATIENT_CLINIC_OR_DEPARTMENT_OTHER): Payer: Self-pay | Admitting: *Deleted

## 2016-03-24 NOTE — Progress Notes (Signed)
NPO AFTER MN.  ARRIVE AT 0745.  NEEDS ISTAT 8 AND EKG.

## 2016-04-04 ENCOUNTER — Ambulatory Visit (HOSPITAL_BASED_OUTPATIENT_CLINIC_OR_DEPARTMENT_OTHER): Payer: BLUE CROSS/BLUE SHIELD | Admitting: Anesthesiology

## 2016-04-04 ENCOUNTER — Encounter (HOSPITAL_BASED_OUTPATIENT_CLINIC_OR_DEPARTMENT_OTHER): Payer: Self-pay | Admitting: *Deleted

## 2016-04-04 ENCOUNTER — Ambulatory Visit (HOSPITAL_BASED_OUTPATIENT_CLINIC_OR_DEPARTMENT_OTHER)
Admission: RE | Admit: 2016-04-04 | Discharge: 2016-04-04 | Disposition: A | Discharge status: Home / Self Care | Payer: BLUE CROSS/BLUE SHIELD | Source: Ambulatory Visit | Attending: Urology | Admitting: Urology

## 2016-04-04 ENCOUNTER — Encounter (HOSPITAL_BASED_OUTPATIENT_CLINIC_OR_DEPARTMENT_OTHER)
Admission: RE | Disposition: A | Discharge status: Home / Self Care | Payer: Self-pay | Source: Ambulatory Visit | Attending: Urology

## 2016-04-04 DIAGNOSIS — M109 Gout, unspecified: Secondary | ICD-10-CM | POA: Diagnosis not present

## 2016-04-04 DIAGNOSIS — N4601 Organic azoospermia: Secondary | ICD-10-CM | POA: Diagnosis not present

## 2016-04-04 DIAGNOSIS — Z79899 Other long term (current) drug therapy: Secondary | ICD-10-CM | POA: Diagnosis not present

## 2016-04-04 DIAGNOSIS — I10 Essential (primary) hypertension: Secondary | ICD-10-CM | POA: Diagnosis not present

## 2016-04-04 DIAGNOSIS — E119 Type 2 diabetes mellitus without complications: Secondary | ICD-10-CM | POA: Diagnosis not present

## 2016-04-04 DIAGNOSIS — Z794 Long term (current) use of insulin: Secondary | ICD-10-CM | POA: Diagnosis not present

## 2016-04-04 HISTORY — DX: Organic azoospermia: N46.01

## 2016-04-04 HISTORY — DX: Presence of spectacles and contact lenses: Z97.3

## 2016-04-04 HISTORY — DX: Testicular hypofunction: E29.1

## 2016-04-04 HISTORY — DX: Type 2 diabetes mellitus without complications: E11.9

## 2016-04-04 HISTORY — PX: TESTICLE BIOPSY: SHX471

## 2016-04-04 LAB — POCT I-STAT, CHEM 8
BUN: 17 mg/dL (ref 6–20)
CALCIUM ION: 1.19 mmol/L (ref 1.13–1.30)
CHLORIDE: 96 mmol/L — AB (ref 101–111)
Creatinine, Ser: 1 mg/dL (ref 0.61–1.24)
GLUCOSE: 302 mg/dL — AB (ref 65–99)
HCT: 42 % (ref 39.0–52.0)
HEMOGLOBIN: 14.3 g/dL (ref 13.0–17.0)
Potassium: 3.4 mmol/L — ABNORMAL LOW (ref 3.5–5.1)
SODIUM: 138 mmol/L (ref 135–145)
TCO2: 28 mmol/L (ref 0–100)

## 2016-04-04 LAB — GLUCOSE, CAPILLARY: GLUCOSE-CAPILLARY: 298 mg/dL — AB (ref 65–99)

## 2016-04-04 SURGERY — BIOPSY, TESTICLE
Anesthesia: General | Site: Scrotum

## 2016-04-04 MED ORDER — BUPIVACAINE HCL 0.5 % IJ SOLN
INTRAMUSCULAR | Status: DC | PRN
  Administered 2016-04-04: 8 mL

## 2016-04-04 MED ORDER — CEFAZOLIN IN D5W 1 GM/50ML IV SOLN
INTRAVENOUS | Status: AC
  Filled 2016-04-04: qty 50

## 2016-04-04 MED ORDER — CEFAZOLIN IN D5W 1 GM/50ML IV SOLN
1.0000 g | INTRAVENOUS | Status: DC
  Filled 2016-04-04: qty 50

## 2016-04-04 MED ORDER — ONDANSETRON HCL 4 MG/2ML IJ SOLN
INTRAMUSCULAR | Status: DC | PRN
  Administered 2016-04-04: 4 mg via INTRAVENOUS

## 2016-04-04 MED ORDER — TRAMADOL HCL 50 MG PO TABS: 100.0000 mg | ORAL_TABLET | Freq: Four times a day (QID) | ORAL | Status: DC | PRN

## 2016-04-04 MED ORDER — ONDANSETRON HCL 4 MG/2ML IJ SOLN
4.0000 mg | Freq: Once | INTRAMUSCULAR | Status: DC | PRN
  Filled 2016-04-04: qty 2

## 2016-04-04 MED ORDER — LIDOCAINE HCL 1 % IJ SOLN: INTRAMUSCULAR | Status: DC | PRN

## 2016-04-04 MED ORDER — PROPOFOL 500 MG/50ML IV EMUL
INTRAVENOUS | Status: DC | PRN
  Administered 2016-04-04: 350 mL via INTRAVENOUS
  Administered 2016-04-04: 50 mL via INTRAVENOUS

## 2016-04-04 MED ORDER — DEXTROSE 5 % IV SOLN
3.0000 g | INTRAVENOUS | Status: AC
  Administered 2016-04-04: 3 g via INTRAVENOUS
  Filled 2016-04-04 (×2): qty 3000

## 2016-04-04 MED ORDER — SODIUM CHLORIDE 0.9 % IR SOLN
Status: DC | PRN
  Administered 2016-04-04: 500 mL

## 2016-04-04 MED ORDER — TRAMADOL HCL 50 MG PO TABS
100.0000 mg | ORAL_TABLET | Freq: Four times a day (QID) | ORAL | Status: DC | PRN
  Administered 2016-04-04: 100 mg via ORAL
  Filled 2016-04-04: qty 2

## 2016-04-04 MED ORDER — LACTATED RINGERS IV SOLN
INTRAVENOUS | Status: DC
  Administered 2016-04-04: 08:00:00 via INTRAVENOUS
  Filled 2016-04-04: qty 1000

## 2016-04-04 MED ORDER — CEFAZOLIN SODIUM-DEXTROSE 2-4 GM/100ML-% IV SOLN
INTRAVENOUS | Status: AC
  Filled 2016-04-04: qty 100

## 2016-04-04 MED ORDER — MEPERIDINE HCL 25 MG/ML IJ SOLN
6.2500 mg | INTRAMUSCULAR | Status: DC | PRN
  Filled 2016-04-04: qty 1

## 2016-04-04 MED ORDER — MIDAZOLAM HCL 5 MG/5ML IJ SOLN
INTRAMUSCULAR | Status: DC | PRN
  Administered 2016-04-04: 2 mg via INTRAVENOUS

## 2016-04-04 MED ORDER — DEXAMETHASONE SODIUM PHOSPHATE 10 MG/ML IJ SOLN
INTRAMUSCULAR | Status: DC | PRN
  Administered 2016-04-04: 10 mg via INTRAVENOUS

## 2016-04-04 MED ORDER — HYDROMORPHONE HCL 1 MG/ML IJ SOLN
0.2500 mg | INTRAMUSCULAR | Status: DC | PRN
  Filled 2016-04-04: qty 1

## 2016-04-04 MED ORDER — LIDOCAINE HCL (CARDIAC) 20 MG/ML IV SOLN
INTRAVENOUS | Status: DC | PRN
  Administered 2016-04-04: 100 mg via INTRAVENOUS

## 2016-04-04 SURGICAL SUPPLY — 41 items
BLADE SURG 15 STRL LF DISP TIS (BLADE) ×1 IMPLANT
BLADE SURG 15 STRL SS (BLADE) ×2
BNDG GAUZE ELAST 4 BULKY (GAUZE/BANDAGES/DRESSINGS) ×3 IMPLANT
BRIEF STRETCH FOR OB PAD LRG (UNDERPADS AND DIAPERS) ×3 IMPLANT
COVER BACK TABLE 60X90IN (DRAPES) ×3 IMPLANT
COVER MAYO STAND STRL (DRAPES) ×3 IMPLANT
DRAIN PENROSE 18X1/4 LTX STRL (WOUND CARE) IMPLANT
DRAPE LAPAROTOMY 100X72 PEDS (DRAPES) ×3 IMPLANT
ELECT REM PT RETURN 9FT ADLT (ELECTROSURGICAL) ×3
ELECTRODE REM PT RTRN 9FT ADLT (ELECTROSURGICAL) ×1 IMPLANT
GAUZE SPONGE 4X4 16PLY XRAY LF (GAUZE/BANDAGES/DRESSINGS) ×3 IMPLANT
GLOVE BIO SURGEON STRL SZ 6.5 (GLOVE) ×2 IMPLANT
GLOVE BIO SURGEONS STRL SZ 6.5 (GLOVE) ×1
GLOVE BIOGEL M STRL SZ7.5 (GLOVE) ×3 IMPLANT
GLOVE INDICATOR 6.5 STRL GRN (GLOVE) ×3 IMPLANT
GLOVE INDICATOR 7.5 STRL GRN (GLOVE) ×3 IMPLANT
GLOVE SURG SS PI 8.0 STRL IVOR (GLOVE) ×3 IMPLANT
GOWN STRL REUS W/ TWL LRG LVL3 (GOWN DISPOSABLE) ×1 IMPLANT
GOWN STRL REUS W/ TWL XL LVL3 (GOWN DISPOSABLE) ×2 IMPLANT
GOWN STRL REUS W/TWL LRG LVL3 (GOWN DISPOSABLE) ×2
GOWN STRL REUS W/TWL XL LVL3 (GOWN DISPOSABLE) ×4
KIT ROOM TURNOVER WOR (KITS) ×3 IMPLANT
LIQUID BAND (GAUZE/BANDAGES/DRESSINGS) ×3 IMPLANT
NEEDLE HYPO 22GX1.5 SAFETY (NEEDLE) ×3 IMPLANT
NS IRRIG 500ML POUR BTL (IV SOLUTION) ×3 IMPLANT
PACK BASIN DAY SURGERY FS (CUSTOM PROCEDURE TRAY) ×3 IMPLANT
PENCIL BUTTON HOLSTER BLD 10FT (ELECTRODE) ×3 IMPLANT
SPONGE LAP 4X18 X RAY DECT (DISPOSABLE) ×3 IMPLANT
SUPPORT SCROTAL LG STRP (MISCELLANEOUS) IMPLANT
SUPPORTER ATHLETIC LG (MISCELLANEOUS)
SUT CHROMIC 3 0 SH 27 (SUTURE) IMPLANT
SUT PDS AB 4-0 RB1 27 (SUTURE) IMPLANT
SUT VIC AB 2-0 SH 27 (SUTURE)
SUT VIC AB 2-0 SH 27XBRD (SUTURE) IMPLANT
SUT VICRYL 4-0 PS2 18IN ABS (SUTURE) ×3 IMPLANT
SYR BULB IRRIGATION 50ML (SYRINGE) ×3 IMPLANT
SYR CONTROL 10ML LL (SYRINGE) IMPLANT
TRAY DSU PREP LF (CUSTOM PROCEDURE TRAY) ×3 IMPLANT
TUBE CONNECTING 12'X1/4 (SUCTIONS) ×1
TUBE CONNECTING 12X1/4 (SUCTIONS) ×2 IMPLANT
YANKAUER SUCT BULB TIP NO VENT (SUCTIONS) ×3 IMPLANT

## 2016-04-04 NOTE — Discharge Instructions (Signed)
Incision Care An incision is when a surgeon cuts into your body. After surgery, the incision needs to be cared for properly to prevent infection.  HOW TO CARE FOR YOUR INCISION  Do not take baths, swim, or use a hot tub for at least three weeks. You may shower tomorrow.   ICE scrotum on left for 30 minutes on and 30 minutes off for the next few hours   Resume your normal diet and activities as directed.  Drink enough fluid to keep your urine clear or pale yellow. SEEK MEDICAL CARE IF:   You have drainage, redness, swelling, or pain at your incision site.  You have muscle aches, chills, or a general ill feeling.  You notice a bad smell coming from the incision or dressing.  Your incision edges separate after the sutures, staples, or skin adhesive strips have been removed.  You have persistent nausea or vomiting.  You have a fever.  You are dizzy. SEEK IMMEDIATE MEDICAL CARE IF:   You have a rash.  You faint.  You have difficulty breathing. MAKE SURE YOU:   Understand these instructions.  Will watch your condition.  Will get help right away if you are not doing well or get worse.   This information is not intended to replace advice given to you by your health care provider. Make sure you discuss any questions you have with your health care provider.   Document Released: 03/31/2005 Document Revised: 10/02/2014 Document Reviewed: 11/05/2013 Elsevier Interactive Patient Education 2016 [employer].  Post Anesthesia Home Care Instructions  Activity: Get plenty of rest for the remainder of the day. A responsible adult should stay with you for 24 hours following the procedure.  For the next 24 hours, DO NOT: -Drive a car -Advertising copywriter -Drink alcoholic beverages -Take any medication unless instructed by your physician -Make any legal decisions or sign important papers.  Meals: Start with liquid foods such as gelatin or soup. Progress to regular foods as  tolerated. Avoid greasy, spicy, heavy foods. If nausea and/or vomiting occur, drink only clear liquids until the nausea and/or vomiting subsides. Call your physician if vomiting continues.  Special Instructions/Symptoms: Your throat may feel dry or sore from the anesthesia or the breathing tube placed in your throat during surgery. If this causes discomfort, gargle with warm salt water. The discomfort should disappear within 24 hours.  If you had a scopolamine patch placed behind your ear for the management of post- operative nausea and/or vomiting:  1. The medication in the patch is effective for 72 hours, after which it should be removed.  Wrap patch in a tissue and discard in the trash. Wash hands thoroughly with soap and water. 2. You may remove the patch earlier than 72 hours if you experience unpleasant side effects which may include dry mouth, dizziness or visual disturbances. 3. Avoid touching the patch. Wash your hands with soap and water after contact with the patch.    HOME CARE INSTRUCTIONS FOR SCROTAL PROCEDURES  Wound Care & Hygiene: You may apply an ice bag to the scrotum for the first 24 hours.  This may help decrease swelling and soreness.  You may have a dressing held in place by an athletic supporter.  You may remove the dressing in 24 hours and shower in 48 hours.  Continue to use the athletic supporter or tight briefs for at least a week. Activity: Rest today - not necessarily flat bed rest.  Just take it easy.  You should  not do strenuous activities until your follow-up visit with your doctor.  You may resume light activity in 48 hours.  Return to Work:  Your doctor will advise you of this depending on the type of work you do  Diet: Drink liquids or eat a light diet this evening.  You may resume a regular diet tomorrow.  General Expectations: You may have a small amount of bleeding.  The scrotum may be swollen or bruised for about a week.  Call your Doctor if these  occur:  -persistent or heavy bleeding  -temperature of 101 degrees or more  -severe pain, not relieved by your pain medication  [facility]:  Call to set up and appointment.  Patient Signature:  __________________________________________________  Nurse's Signature:  __________________________________________________

## 2016-04-04 NOTE — Anesthesia Preprocedure Evaluation (Addendum)
Anesthesia Evaluation  Patient identified by MRN, date of birth, ID band Patient awake    Reviewed: Allergy & Precautions, NPO status , Patient's Chart, lab work & pertinent test results  Airway Mallampati: II  TM Distance: >3 FB Neck ROM: Full    Dental  (+) Teeth Intact, Dental Advisory Given   Pulmonary neg pulmonary ROS,    Pulmonary exam normal        Cardiovascular Exercise Tolerance: Good hypertension, Pt. on medications Normal cardiovascular exam Rhythm:Regular Rate:Normal - Carotid Bruit    Neuro/Psych negative neurological ROS  negative psych ROS   GI/Hepatic negative GI ROS, Neg liver ROS,   Endo/Other  diabetes, Type 2, Insulin Dependent, Oral Hypoglycemic Agents  Renal/GU negative Renal ROS     Musculoskeletal negative musculoskeletal ROS (+)   Abdominal Normal abdominal exam  (+)   Peds  Hematology negative hematology ROS (+)   Anesthesia Other Findings   Reproductive/Obstetrics Azoospermia                         Anesthesia Physical Anesthesia Plan  ASA: II  Anesthesia Plan: General   Post-op Pain Management:    Induction: Intravenous  Airway Management Planned: LMA  Additional Equipment:   Intra-op Plan:   Post-operative Plan: Extubation in OR  Informed Consent: I have reviewed the patients History and Physical, chart, labs and discussed the procedure including the risks, benefits and alternatives for the proposed anesthesia with the patient or authorized representative who has indicated his/her understanding and acceptance.   Dental advisory given  Plan Discussed with: CRNA, Surgeon and Anesthesiologist  Anesthesia Plan Comments:        Anesthesia Quick Evaluation

## 2016-04-04 NOTE — Anesthesia Procedure Notes (Signed)
Procedure Name: LMA Insertion Date/Time: 04/04/2016 9:20 AM Performed by: Tyrone NineSAUVE, Pristine Gladhill F Pre-anesthesia Checklist: Patient identified, Timeout performed, Emergency Drugs available, Suction available and Patient being monitored Patient Re-evaluated:Patient Re-evaluated prior to inductionOxygen Delivery Method: Circle system utilized Preoxygenation: Pre-oxygenation with 100% oxygen Intubation Type: IV induction Ventilation: Mask ventilation without difficulty LMA: LMA inserted LMA Size: 5.0 Number of attempts: 1 Placement Confirmation: positive ETCO2 and breath sounds checked- equal and bilateral Tube secured with: Tape Dental Injury: Teeth and Oropharynx as per pre-operative assessment

## 2016-04-04 NOTE — Transfer of Care (Signed)
Immediate Anesthesia Transfer of Care Note  Patient: Robert Brennan  Procedure(s) Performed: Procedure(s): BIOPSY TESTICULAR (N/A)  Patient Location: PACU  Anesthesia Type:General  Level of Consciousness: awake, alert , oriented and patient cooperative  Airway & Oxygen Therapy: Patient Spontanous Breathing and Patient connected to nasal cannula oxygen  Post-op Assessment: Report given to RN and Post -op Vital signs reviewed and stable  Post vital signs: Reviewed and stable  Last Vitals:  Filed Vitals:   04/04/16 0749  BP: 153/92  Pulse: 88  Temp: 36.9 C  Resp: 18    Last Pain: There were no vitals filed for this visit.    Patients Stated Pain Goal: 7 (04/04/16 [id number])  Complications: No apparent anesthesia complications

## 2016-04-04 NOTE — Anesthesia Postprocedure Evaluation (Signed)
Anesthesia Post Note  Patient: Robert Brennan  Procedure(s) Performed: Procedure(s) (LRB): BIOPSY TESTICULAR (N/A)  Patient location during evaluation: PACU Anesthesia Type: General Level of consciousness: awake and alert Pain management: pain level controlled Vital Signs Assessment: post-procedure vital signs reviewed and stable Respiratory status: spontaneous breathing, nonlabored ventilation, respiratory function stable and patient connected to nasal cannula oxygen Cardiovascular status: blood pressure returned to baseline and stable Postop Assessment: no signs of nausea or vomiting Anesthetic complications: no    Last Vitals:  Filed Vitals:   04/04/16 1100 04/04/16 1105  BP: 140/83   Pulse: 98 95  Temp:    Resp: 16 19    Last Pain:  Filed Vitals:   04/04/16 1107  PainSc: Asleep                 [name] DAVID

## 2016-04-04 NOTE — H&P (Signed)
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 He had a good response to clomiphene as far as testosterone production after initially being found to be hypogonadal. According to the patient and his wife a follow-up semen analysis continued to show no sperm and his "chromosomes" were normal and testing with Dr. April MansonYalcinkaya. The patient has no complaints today and has been well.  Past Medical History  Diagnosis Date  . Hypertension   . Gout     per pt stable as of 03-24-2016  . Type 2 diabetes mellitus (HCC)   . Azoospermia   . Hypogonadism, testicular   . Wears glasses    Past Surgical History  Procedure Laterality Date  . Colonoscopy  2011  . Breast reduction surgery  age 45 and [age]    Home Medications:  Prescriptions prior to admission  Medication Sig Dispense Refill Last Dose  . allopurinol (ZYLOPRIM) 300 MG tablet Take 600 mg by mouth every morning.    Past Week at Unknown time  . amLODipine-benazepril (LOTREL) 10-40 MG per capsule Take 1 capsule by mouth every morning.    Past Week at Unknown time  . atenolol-chlorthalidone (TENORETIC) 100-25 MG per tablet Take 1 tablet by mouth every morning.    Past Week at Unknown time  . insulin NPH-regular Human (NOVOLIN 70/30) (70-30) 100 UNIT/ML injection Inject 70 Units into the skin 2 (two) times daily.   Past Week at Unknown time  . metFORMIN (GLUCOPHAGE-XR) 750 MG 24 hr tablet Take 750 mg by mouth 2 (two) times daily.    Past Week at Unknown time  . potassium chloride (K-DUR,KLOR-CON) 10 MEQ tablet Take 10 mEq by mouth 2 (two) times daily. 2 TABLETS IN AM AND 1 TABLET IN PM   Past Week at Unknown time  . tadalafil (CIALIS) 5 MG tablet Take 5 mg by mouth daily as needed for erectile dysfunction. Take 1/2 tablet per day   Past Month at Unknown time   Allergies: No Known Allergies  Family History  Problem Relation Age of  Onset  . Arthritis Mother   . Hypertension Mother   . Cancer Father    Social History:  reports that he has never smoked. He has never used smokeless tobacco. He reports that he does not drink alcohol or use illicit drugs.  ROS: A complete review of systems was performed.  All systems are negative except for pertinent findings as noted. ROS   Physical Exam:  Vital signs in last 24 hours: Temp:  [98.5 F (36.9 C)] 98.5 F (36.9 C) (07/11 0749) Pulse Rate:  [88] 88 (07/11 0749) Resp:  [18] 18 (07/11 0749) BP: (153)/(92) 153/92 mmHg (07/11 0749) SpO2:  [99 %] 99 % (07/11 0749) Weight:  [141.069 kg (311 lb)] 141.069 kg (311 lb) (07/11 0749) General:  Alert and oriented, No acute distress HEENT: Normocephalic, atraumatic Cardiovascular: Regular rate and rhythm Lungs: Regular rate and effort Abdomen: Soft, nontender, nondistended, no abdominal masses Extremities: No edema Neurologic: Grossly intact  Laboratory Data:  Results for orders placed or performed during the hospital encounter of 04/04/16 (from the past 24 hour(s))  I-STAT, chem 8     Status: Abnormal   Collection Time: 04/04/16  8:04 AM  Result Value Ref Range   Sodium 138 135 - 145 mmol/L   Potassium 3.4 (L) 3.5 - 5.1 mmol/L   Chloride 96 (L) 101 - 111 mmol/L   BUN 17 6 -  20 mg/dL   Creatinine, Ser 1.61 0.61 - 1.24 mg/dL   Glucose, Bld 096 (H) 65 - 99 mg/dL   Calcium, Ion 0.45 4.09 - 1.30 mmol/L   TCO2 28 0 - 100 mmol/L   Hemoglobin 14.3 13.0 - 17.0 g/dL   HCT 81.1 91.4 - 78.2 %   No results found for this or any previous visit (from the past 240 hour(s)). Creatinine:  Recent Labs  04/04/16 0804  CREATININE 1.00    Impression/Assessment:  Azoospermia  Plan:  I discussed with the patient the nature, potential benefits, risks and alternatives to open testicular sperm extraction, including side effects of the proposed treatment, the likelihood of the patient achieving the goals of the procedure, and any  potential problems that might occur during the procedure or recuperation. All questions answered. Patient elects to proceed. I did not guarantee that we would find sperm. We discussed idiopathic cases of azoospermia typically have 50-60% sperm retrieval rates.   Martasia Talamante 04/04/2016, 8:51 AM  \

## 2016-04-04 NOTE — Op Note (Signed)
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 Normal seminal volume and pH. He had normal chromosomal analysis and no micro-deletions. He was brought for testicular biopsy.  Findings: The testicles were palpably normal with some mild atrophy right greater than left. The epididymides were palpably normal but firm.  Description of procedure: After consent was obtained patient brought to the operating room. After adequate anesthesia the scrotum was shaved with clippers and he was prepped and draped in the usual sterile fashion. A timeout was performed to confirm the patient and procedure. A 2-3 cm midline incision was made over the median raphae after injection of local under the skin and the left cord block. The dartos fascia was dissected and the tunica vaginalis to open over the left testicle and the testicle delivered through the wound. The left epididymis was aspirated and no fluid return. Sperm preservation media was injected into the epididymis and aspirated. No sperm are noted under microscopic exam. Attention was turned to the left testicle and the tunica albuginea was opened with an incision over an avascular area. Seminiferous tubules were extruded excised and then examined under the microscope. Many sperm were noted per high-powered field under microscopic exam. The tissue was preserved. Adequate hemostasis was obtained in the tunica albuginea was closed with a running 4-0 Vicryl suture. The testicle was irrigated and placed back in the left hemiscrotum without torsion. The dartos fascia was closed with a running 2-0 Vicryl suture. The skin was closed with a running 3-0 chromic suture. The patient was then awakened taken to the recovery room in stable  condition.   complications: None  Blood loss: 3 mL  Specimens: Testicular tissue preserved by Dr. Lyndal RainbowYalcinkaya's team   Drains: None  Disposition: Patient stable to PACU

## 2016-04-07 ENCOUNTER — Encounter (HOSPITAL_BASED_OUTPATIENT_CLINIC_OR_DEPARTMENT_OTHER): Payer: Self-pay | Admitting: Urology

## 2021-10-31 ENCOUNTER — Other Ambulatory Visit
Admission: RE | Admit: 2021-10-31 | Discharge: 2021-10-31 | Disposition: A | Discharge status: Home / Self Care | Payer: BC Managed Care – PPO | Source: Ambulatory Visit | Attending: Physician Assistant | Admitting: Physician Assistant

## 2021-10-31 ENCOUNTER — Other Ambulatory Visit: Payer: Self-pay

## 2021-10-31 ENCOUNTER — Encounter: Payer: BC Managed Care – PPO | Attending: Physician Assistant | Admitting: Physician Assistant

## 2021-10-31 DIAGNOSIS — W2203XA Walked into furniture, initial encounter: Secondary | ICD-10-CM | POA: Diagnosis not present

## 2021-10-31 DIAGNOSIS — B999 Unspecified infectious disease: Secondary | ICD-10-CM | POA: Diagnosis not present

## 2021-10-31 DIAGNOSIS — T792XXA Traumatic secondary and recurrent hemorrhage and seroma, initial encounter: Secondary | ICD-10-CM | POA: Insufficient documentation

## 2021-10-31 DIAGNOSIS — E11622 Type 2 diabetes mellitus with other skin ulcer: Secondary | ICD-10-CM | POA: Insufficient documentation

## 2021-10-31 DIAGNOSIS — L97822 Non-pressure chronic ulcer of other part of left lower leg with fat layer exposed: Secondary | ICD-10-CM | POA: Insufficient documentation

## 2021-10-31 DIAGNOSIS — I10 Essential (primary) hypertension: Secondary | ICD-10-CM | POA: Diagnosis not present

## 2021-11-01 ENCOUNTER — Ambulatory Visit: Payer: Self-pay | Admitting: Surgery

## 2021-11-01 NOTE — H&P (Signed)
Subjective:  CC: Abscess of right leg [L02.415]  HPI:  Robert Brennan is a 51 y.o. male who was referred by Dalbert Mayotte III, PA for evaluation of above. First noted several weeks ago.  Symptoms include: Pain is sharp, worsening.  Exacerbated by touch.  Alleviated by nothing specific.  Associated with purulent drainage.  Initially started after developing wound there after bumping into something.  The redness around it has been around prior to wound.    Had debridment done by podiatry, who then requested f/u with wound care.  Wound care noted developing eschar so sent to surgery.   Past Medical History:  has a past medical history of Diabetes mellitus type 2, uncomplicated (CMS-HCC), Gout, Hypertension, Hypokalemia (12/08/2012), and Morbid obesity with BMI of 40.0-44.9, adult (CMS-HCC).  Past Surgical History:  has a past surgical history that includes sperm retrieval; Colonoscopy; and colonoscopy w/removal lesions by snare (N/A, 07/15/2019).  Family History: family history includes Colon cancer in his paternal grandmother; High blood pressure (Hypertension) in his mother; Lung cancer in his father; Lymphoma in his father; Prostate cancer in his father.  Social History:  reports that he has never smoked. He has never used smokeless tobacco. He reports current alcohol use. He reports that he does not use drugs.  Current Medications: has a current medication list which includes the following prescription(s): allopurinol, amlodipine, atorvastatin, benazepril, contour test strips, blood glucose diagnostic, blood-glucose meter, blood-glucose meter, cephalexin, cialis, fluticasone propionate, insulin degludec, onetouch delica lancets, lancets, meloxicam, metformin, metoprolol succinate, potassium chloride, ozempic, spironolactone, promethazine-dextromethorphan, and [DISCONTINUED] empagliflozin-metformin.  Allergies:  Allergies Allergen Reactions  Thiazides Other (See Comments)   Low K   ROS:  A  15 point review of systems was performed and pertinent positives and negatives noted in HPI   Objective:    BP 136/84    Pulse 99    Ht 182.9 cm (6')    Wt (!) 126.1 kg (278 lb)    BMI 37.70 kg/m   Constitutional :  No distress, cooperative, alert Lymphatics/Throat:  Supple with no lymphadenopathy Respiratory:  Clear to auscultation bilaterally Cardiovascular:  Regular rate and rhythm Gastrointestinal: Soft, non-tender, non-distended, no organomegaly. Musculoskeletal: Steady gait and movement Skin: Cool and moist, open wound in right lower leg with wet eschar and purulent drainage underneath it. Psychiatric: Normal affect, non-agitated, not confused       LABS:  n/a   RADS: n/a  Assessment:     Abscess of right leg [L02.415], active infection with necrotic tissue, requires debridement.  Plan:    1. Abscess of right leg [L02.415] Discussed surgical debridement.  Alternatives include continued observation which will unlikely to heal on its own.  Benefits include possible symptom relief, pathologic evaluation, improved cosmesis. Discussed the risk of surgery including recurrence, chronic pain, post-op infxn, poor cosmesis, poor/delayed wound healing, and possible re-operation to address said risks. The risks of general anesthetic, if used, includes MI, CVA, sudden death or even reaction to anesthetic medications also discussed.  Typical post-op recovery time of weeks with possible activity restrictions were also discussed.  The patient verbalized understanding and all questions were answered to the patient's satisfaction.  2. Patient has elected to proceed with surgical treatment. Procedure will be scheduled. Right leg debridement , supine position.  labs/images/medications/previous chart entries reviewed personally and relevant changes/updates noted above.

## 2021-11-01 NOTE — H&P (View-Only) (Signed)
Subjective: ° °CC: Abscess of right leg [L02.415] ° °HPI: ° Robert Brennan is a 51 y.o. male who was referred by Hoyt Eday Stone III, PA for evaluation of above. First noted several weeks ago.  Symptoms include: Pain is sharp, worsening.  Exacerbated by touch.  Alleviated by nothing specific.  Associated with purulent drainage. ° °Initially started after developing wound there after bumping into something.  The redness around it has been around prior to wound.    Had debridment done by podiatry, who then requested f/u with wound care.  Wound care noted developing eschar so sent to surgery. °  °Past Medical History:  has a past medical history of Diabetes mellitus type 2, uncomplicated (CMS-HCC), Gout, Hypertension, Hypokalemia (12/08/2012), and Morbid obesity with BMI of 40.0-44.9, adult (CMS-HCC). ° °Past Surgical History:  has a past surgical history that includes sperm retrieval; Colonoscopy; and colonoscopy w/removal lesions by snare (N/A, 07/15/2019). ° °Family History: family history includes Colon cancer in his paternal grandmother; High blood pressure (Hypertension) in his mother; Lung cancer in his father; Lymphoma in his father; Prostate cancer in his father. ° °Social History:  reports that he has never smoked. He has never used smokeless tobacco. He reports current alcohol use. He reports that he does not use drugs. ° °Current Medications: has a current medication list which includes the following prescription(s): allopurinol, amlodipine, atorvastatin, benazepril, contour test strips, blood glucose diagnostic, blood-glucose meter, blood-glucose meter, cephalexin, cialis, fluticasone propionate, insulin degludec, onetouch delica lancets, lancets, meloxicam, metformin, metoprolol succinate, potassium chloride, ozempic, spironolactone, promethazine-dextromethorphan, and [DISCONTINUED] empagliflozin-metformin. ° °Allergies:  °Allergies °Allergen Reactions ° Thiazides Other (See Comments) °  Low K ° ° °ROS:  °A  15 point review of systems was performed and pertinent positives and negatives noted in HPI °  °Objective: °  ° °BP 136/84    Pulse 99    Ht 182.9 cm (6')    Wt (!) 126.1 kg (278 lb)    BMI 37.70 kg/m²  ° °Constitutional :  No distress, cooperative, alert °Lymphatics/Throat:  Supple with no lymphadenopathy °Respiratory:  Clear to auscultation bilaterally °Cardiovascular:  Regular rate and rhythm °Gastrointestinal: Soft, non-tender, non-distended, no organomegaly. °Musculoskeletal: Steady gait and movement °Skin: Cool and moist, open wound in right lower leg with wet eschar and purulent drainage underneath it. °Psychiatric: Normal affect, non-agitated, not confused °  °  °  °LABS:  °n/a  ° °RADS: °n/a ° °Assessment: ° °   °Abscess of right leg [L02.415], active infection with necrotic tissue, requires debridement. ° °Plan: ° °  °1. Abscess of right leg [L02.415] Discussed surgical debridement.  Alternatives include continued observation which will unlikely to heal on its own.  Benefits include possible symptom relief, pathologic evaluation, improved cosmesis. °Discussed the risk of surgery including recurrence, chronic pain, post-op infxn, poor cosmesis, poor/delayed wound healing, and possible re-operation to address said risks. The risks of general anesthetic, if used, includes MI, CVA, sudden death or even reaction to anesthetic medications also discussed.  °Typical post-op recovery time of weeks with possible activity restrictions were also discussed. ° °The patient verbalized understanding and all questions were answered to the patient's satisfaction. ° °2. Patient has elected to proceed with surgical treatment. Procedure will be scheduled. Right leg debridement , supine position. ° °labs/images/medications/previous chart entries reviewed personally and relevant changes/updates noted above. ° ° °

## 2021-11-02 ENCOUNTER — Ambulatory Visit
Admission: RE | Admit: 2021-11-02 | Discharge: 2021-11-02 | Disposition: A | Discharge status: Home / Self Care | Payer: BC Managed Care – PPO | Attending: Surgery | Admitting: Surgery

## 2021-11-02 ENCOUNTER — Encounter: Payer: Self-pay | Admitting: Surgery

## 2021-11-02 ENCOUNTER — Other Ambulatory Visit: Payer: Self-pay

## 2021-11-02 ENCOUNTER — Ambulatory Visit: Payer: BC Managed Care – PPO | Admitting: Certified Registered"

## 2021-11-02 ENCOUNTER — Encounter
Admission: RE | Disposition: A | Discharge status: Home / Self Care | Payer: Self-pay | Source: Home / Self Care | Attending: Surgery

## 2021-11-02 DIAGNOSIS — Z79899 Other long term (current) drug therapy: Secondary | ICD-10-CM | POA: Diagnosis not present

## 2021-11-02 DIAGNOSIS — L02415 Cutaneous abscess of right lower limb: Secondary | ICD-10-CM | POA: Insufficient documentation

## 2021-11-02 DIAGNOSIS — S81802D Unspecified open wound, left lower leg, subsequent encounter: Secondary | ICD-10-CM

## 2021-11-02 DIAGNOSIS — Z6841 Body Mass Index (BMI) 40.0 and over, adult: Secondary | ICD-10-CM | POA: Insufficient documentation

## 2021-11-02 DIAGNOSIS — Z7984 Long term (current) use of oral hypoglycemic drugs: Secondary | ICD-10-CM | POA: Insufficient documentation

## 2021-11-02 DIAGNOSIS — I10 Essential (primary) hypertension: Secondary | ICD-10-CM | POA: Insufficient documentation

## 2021-11-02 DIAGNOSIS — S81801D Unspecified open wound, right lower leg, subsequent encounter: Secondary | ICD-10-CM

## 2021-11-02 DIAGNOSIS — E119 Type 2 diabetes mellitus without complications: Secondary | ICD-10-CM | POA: Diagnosis not present

## 2021-11-02 HISTORY — PX: WOUND DEBRIDEMENT: SHX247

## 2021-11-02 LAB — GLUCOSE, CAPILLARY
Glucose-Capillary: 159 mg/dL — ABNORMAL HIGH (ref 70–99)
Glucose-Capillary: 132 mg/dL — ABNORMAL HIGH (ref 70–99)

## 2021-11-02 SURGERY — DEBRIDEMENT, WOUND
Anesthesia: General | Site: Leg Lower | Laterality: Right

## 2021-11-02 MED ORDER — DEXMEDETOMIDINE HCL IN NACL 200 MCG/50ML IV SOLN
INTRAVENOUS | Status: DC | PRN
  Administered 2021-11-02: 20 ug via INTRAVENOUS

## 2021-11-02 MED ORDER — BUPIVACAINE-EPINEPHRINE 0.5% -1:200000 IJ SOLN
INTRAMUSCULAR | Status: DC | PRN
  Administered 2021-11-02: 15 mL

## 2021-11-02 MED ORDER — ORAL CARE MOUTH RINSE: 15.0000 mL | Freq: Once | OROMUCOSAL | Status: AC

## 2021-11-02 MED ORDER — HYDROCODONE-ACETAMINOPHEN 5-325 MG PO TABS: 1.0000 | ORAL_TABLET | Freq: Four times a day (QID) | ORAL | 0 refills | Status: DC | PRN

## 2021-11-02 MED ORDER — DOCUSATE SODIUM 100 MG PO CAPS: 100.0000 mg | ORAL_CAPSULE | Freq: Two times a day (BID) | ORAL | 0 refills | Status: AC | PRN

## 2021-11-02 MED ORDER — LACTATED RINGERS IV SOLN
INTRAVENOUS | Status: DC | PRN
Start: 2021-11-02 — End: 2021-11-02

## 2021-11-02 MED ORDER — CHLORHEXIDINE GLUCONATE CLOTH 2 % EX PADS: 6.0000 | MEDICATED_PAD | Freq: Once | CUTANEOUS | Status: DC

## 2021-11-02 MED ORDER — PROPOFOL 10 MG/ML IV BOLUS
INTRAVENOUS | Status: DC | PRN
  Administered 2021-11-02: 250 mg via INTRAVENOUS

## 2021-11-02 MED ORDER — CHLORHEXIDINE GLUCONATE 0.12 % MT SOLN
OROMUCOSAL | Status: AC
  Administered 2021-11-02: 15 mL via OROMUCOSAL
  Filled 2021-11-02: qty 15

## 2021-11-02 MED ORDER — FENTANYL CITRATE (PF) 100 MCG/2ML IJ SOLN
INTRAMUSCULAR | Status: DC | PRN
  Administered 2021-11-02 (×2): 50 ug via INTRAVENOUS

## 2021-11-02 MED ORDER — CEFAZOLIN SODIUM-DEXTROSE 2-4 GM/100ML-% IV SOLN: 2.0000 g | INTRAVENOUS | Status: DC

## 2021-11-02 MED ORDER — IBUPROFEN 800 MG PO TABS: 800.0000 mg | ORAL_TABLET | Freq: Three times a day (TID) | ORAL | 0 refills | Status: DC | PRN

## 2021-11-02 MED ORDER — CEFAZOLIN SODIUM-DEXTROSE 2-4 GM/100ML-% IV SOLN
INTRAVENOUS | Status: AC
  Filled 2021-11-02: qty 100

## 2021-11-02 MED ORDER — CHLORHEXIDINE GLUCONATE 0.12 % MT SOLN: 15.0000 mL | Freq: Once | OROMUCOSAL | Status: AC

## 2021-11-02 MED ORDER — SODIUM CHLORIDE 0.9 % IV SOLN: INTRAVENOUS | Status: DC

## 2021-11-02 MED ORDER — LIDOCAINE HCL (CARDIAC) PF 100 MG/5ML IV SOSY
PREFILLED_SYRINGE | INTRAVENOUS | Status: DC | PRN
  Administered 2021-11-02 (×2): 100 mg via INTRAVENOUS

## 2021-11-02 MED ORDER — BUPIVACAINE-EPINEPHRINE (PF) 0.5% -1:200000 IJ SOLN
INTRAMUSCULAR | Status: AC
  Filled 2021-11-02: qty 30

## 2021-11-02 MED ORDER — ACETAMINOPHEN 325 MG PO TABS: 650.0000 mg | ORAL_TABLET | Freq: Three times a day (TID) | ORAL | 0 refills | Status: AC | PRN

## 2021-11-02 MED ORDER — FENTANYL CITRATE (PF) 100 MCG/2ML IJ SOLN
INTRAMUSCULAR | Status: AC
  Filled 2021-11-02: qty 2

## 2021-11-02 MED ORDER — ONDANSETRON HCL 4 MG/2ML IJ SOLN
INTRAMUSCULAR | Status: DC | PRN
  Administered 2021-11-02: 4 mg via INTRAVENOUS

## 2021-11-02 MED ORDER — 0.9 % SODIUM CHLORIDE (POUR BTL) OPTIME
TOPICAL | Status: DC | PRN
  Administered 2021-11-02: 500 mL

## 2021-11-02 SURGICAL SUPPLY — 30 items
APL PRP STRL LF DISP 70% ISPRP (MISCELLANEOUS)
BNDG COHESIVE 6X5 TAN ST LF (GAUZE/BANDAGES/DRESSINGS) ×1 IMPLANT
BNDG CONFORM 2 STRL LF (GAUZE/BANDAGES/DRESSINGS) IMPLANT
BNDG GAUZE ELAST 4 BULKY (GAUZE/BANDAGES/DRESSINGS) ×1 IMPLANT
CANISTER WOUND CARE 500ML ATS (WOUND CARE) ×1 IMPLANT
CHLORAPREP W/TINT 26 (MISCELLANEOUS) ×1 IMPLANT
DRAPE LAPAROTOMY 100X77 ABD (DRAPES) ×1 IMPLANT
DRSG EMULSION OIL 3X3 NADH (GAUZE/BANDAGES/DRESSINGS) ×2 IMPLANT
DRSG VAC ATS MED SENSATRAC (GAUZE/BANDAGES/DRESSINGS) ×1 IMPLANT
ELECT REM PT RETURN 9FT ADLT (ELECTROSURGICAL) ×2
ELECTRODE REM PT RTRN 9FT ADLT (ELECTROSURGICAL) ×1 IMPLANT
GAUZE SPONGE 4X4 12PLY STRL (GAUZE/BANDAGES/DRESSINGS) ×1 IMPLANT
GLOVE SURG SYN 6.5 ES PF (GLOVE) ×2 IMPLANT
GLOVE SURG SYN 6.5 PF PI (GLOVE) ×1 IMPLANT
GLOVE SURG UNDER POLY LF SZ7 (GLOVE) ×2 IMPLANT
GOWN STRL REUS W/ TWL LRG LVL3 (GOWN DISPOSABLE) ×1 IMPLANT
GOWN STRL REUS W/ TWL XL LVL3 (GOWN DISPOSABLE) ×1 IMPLANT
GOWN STRL REUS W/TWL LRG LVL3 (GOWN DISPOSABLE) ×2
GOWN STRL REUS W/TWL XL LVL3 (GOWN DISPOSABLE) ×2
KIT TURNOVER KIT A (KITS) ×2 IMPLANT
LABEL OR SOLS (LABEL) ×1 IMPLANT
MANIFOLD NEPTUNE II (INSTRUMENTS) ×2 IMPLANT
NS IRRIG 500ML POUR BTL (IV SOLUTION) ×2 IMPLANT
PACK BASIN MINOR ARMC (MISCELLANEOUS) ×1 IMPLANT
PACK EXTREMITY ARMC (MISCELLANEOUS) ×1 IMPLANT
PAD PREP 24X41 OB/GYN DISP (PERSONAL CARE ITEMS) ×3 IMPLANT
SOL PREP PVP 2OZ (MISCELLANEOUS) ×2
SOLUTION PREP PVP 2OZ (MISCELLANEOUS) ×1 IMPLANT
STOCKINETTE IMPERV 14X48 (MISCELLANEOUS) ×2 IMPLANT
WATER STERILE IRR 500ML POUR (IV SOLUTION) ×2 IMPLANT

## 2021-11-02 NOTE — Interval H&P Note (Signed)
No change. OK to proceed.

## 2021-11-02 NOTE — Transfer of Care (Signed)
Immediate Anesthesia Transfer of Care Note  Patient: Robert Brennan  Procedure(s) Performed: DEBRIDEMENT WOUND (Right: Leg Lower)  Patient Location: PACU  Anesthesia Type:General  Level of Consciousness: drowsy  Airway & Oxygen Therapy: Patient Spontanous Breathing and Patient connected to face mask oxygen  Post-op Assessment: Report given to RN and Post -op Vital signs reviewed and stable  Post vital signs: Reviewed and stable  Last Vitals:  Vitals Value Taken Time  BP 129/93 11/02/21 1340  Temp 36.1 C 11/02/21 1340  Pulse 96 11/02/21 1340  Resp 14 11/02/21 1340  SpO2 99 % 11/02/21 1340    Last Pain:  Vitals:   11/02/21 1340  TempSrc: Temporal  PainSc: 0-No pain         Complications: No notable events documented.

## 2021-11-02 NOTE — Discharge Instructions (Addendum)
Debridement, Care After This sheet gives you information about how to care for yourself after your procedure. Your health care provider may also give you more specific instructions. If you have problems or questions, contact your health care provider. What can I expect after the procedure? After the procedure, it is common to have: Soreness. Bruising. Itching. Follow these instructions at home: site care Follow instructions from your health care provider about how to take care of your site. Make sure you: Wash your hands with soap and water before and after you change your bandage (dressing). If soap and water are not available, use hand sanitizer. Leave stitches (sutures), skin glue, or adhesive strips in place. These skin closures may need to stay in place for 2 weeks or longer. If adhesive strip edges start to loosen and curl up, you may trim the loose edges. Do not remove adhesive strips completely unless your health care provider tells you to do that. If the area bleeds or bruises, apply gentle pressure for 10 minutes. NO SHOWER UNTIL FOLLOWUP IN OFFICE  Check your site every day for signs of infection. Check for: Redness, swelling, or pain. Fluid or blood. Warmth. Pus or a bad smell.  General instructions Rest and then return to your normal activities as told by your health care provider.  tylenol and advil as needed for discomfort.  Please alternate between the two every four hours as needed for pain.    Use narcotics, if prescribed, only when tylenol and motrin is not enough to control pain.  325-650mg  every 8hrs to max of 3000mg /24hrs (including the 325mg  in every norco dose) for the tylenol.    Advil up to 800mg  per dose every 8hrs as needed for pain.   Keep all follow-up visits as told by your health care provider. This is important. Contact a health care provider if: You have redness, swelling, or pain around your site. You have fluid or blood coming from your site. Your  site feels warm to the touch. You have pus or a bad smell coming from your site. You have a fever. Your sutures, skin glue, or adhesive strips loosen or come off sooner than expected. Get help right away if: You have bleeding that does not stop with pressure or a dressing. Summary After the procedure, it is common to have some soreness, bruising, and itching at the site. Follow instructions from your health care provider about how to take care of your site. Check your site every day for signs of infection. Contact a health care provider if you have redness, swelling, or pain around your site, or your site feels warm to the touch. Keep all follow-up visits as told by your health care provider. This is important. This information is not intended to replace advice given to you by your health care provider. Make sure you discuss any questions you have with your health care provider. Document Released: 10/08/2015 Document Revised: 03/11/2018 Document Reviewed: 03/11/2018 Elsevier Interactive Patient Education  2019 [employer]   AMBULATORY SURGERY  DISCHARGE INSTRUCTIONS   The drugs that you were given will stay in your system until tomorrow so for the next 24 hours you should not:  Drive an automobile Make any legal decisions Drink any alcoholic beverage   You may resume regular meals tomorrow.  Today it is better to start with liquids and gradually work up to solid foods.  You may eat anything you prefer, but it is better to start with liquids, then soup and crackers,  and gradually work up to solid foods.   Please notify your doctor immediately if you have any unusual bleeding, trouble breathing, redness and pain at the surgery site, drainage, fever, or pain not relieved by medication.    Additional Instructions:   Please contact your physician with any problems or Same Day Surgery at [phone], Monday through Friday 6 am to 4 pm, or [place] at [facility] number at  [phone].

## 2021-11-02 NOTE — Anesthesia Preprocedure Evaluation (Signed)
Anesthesia Evaluation  Patient identified by MRN, date of birth, ID band Patient awake    Reviewed: Allergy & Precautions, H&P , NPO status , Patient's Chart, lab work & pertinent test results, reviewed documented beta blocker date and time   Airway Mallampati: III  TM Distance: >3 FB Neck ROM: full    Dental  (+) Teeth Intact, Poor Dentition   Pulmonary neg pulmonary ROS,    Pulmonary exam normal        Cardiovascular Exercise Tolerance: Good hypertension, On Medications negative cardio ROS Normal cardiovascular exam Rate:Normal     Neuro/Psych negative neurological ROS  negative psych ROS   GI/Hepatic negative GI ROS, Neg liver ROS,   Endo/Other  negative endocrine ROSdiabetes, Poorly Controlled, Type 1, Insulin Dependent, Oral Hypoglycemic Agents  Renal/GU negative Renal ROS  negative genitourinary   Musculoskeletal   Abdominal   Peds  Hematology negative hematology ROS (+)   Anesthesia Other Findings   Reproductive/Obstetrics negative OB ROS                             Anesthesia Physical Anesthesia Plan  ASA: 3  Anesthesia Plan: General LMA   Post-op Pain Management:    Induction:   PONV Risk Score and Plan: 3  Airway Management Planned:   Additional Equipment:   Intra-op Plan:   Post-operative Plan:   Informed Consent: I have reviewed the patients History and Physical, chart, labs and discussed the procedure including the risks, benefits and alternatives for the proposed anesthesia with the patient or authorized representative who has indicated his/her understanding and acceptance.       Plan Discussed with: CRNA  Anesthesia Plan Comments:         Anesthesia Quick Evaluation

## 2021-11-02 NOTE — Anesthesia Procedure Notes (Signed)
Procedure Name: LMA Insertion Date/Time: 11/02/2021 12:32 PM Performed by: Philbert Riser, CRNA Pre-anesthesia Checklist: Patient identified, Patient being monitored, Timeout performed, Emergency Drugs available and Suction available Patient Re-evaluated:Patient Re-evaluated prior to induction Oxygen Delivery Method: Circle system utilized Preoxygenation: Pre-oxygenation with 100% oxygen Induction Type: IV induction Ventilation: Mask ventilation without difficulty LMA: LMA inserted LMA Size: 5.0 Tube type: Oral Number of attempts: 1 Placement Confirmation: positive ETCO2 and breath sounds checked- equal and bilateral Tube secured with: Tape Dental Injury: Teeth and Oropharynx as per pre-operative assessment

## 2021-11-02 NOTE — Op Note (Signed)
Pre-Op Dx: right leg wound Post-Op Dx: same Anesthesia: LMA EBL: 30mL Complications:  none apparent Specimen: right leg wound culture Procedure: Debridement of right leg wound Surgeon: Tonna Boehringer  Indications for procedure: See H&P  Description of Procedure:   Preop wound measurement: 6cm x 4.5cm Postop wound measurement: 6cm x 5cm x 73mm deep Amount of tissue removed: aggregate 6cm x 23mm x 39mm deep Depth of wound: 25mm Consent obtained, time out performed.  Patient placed in supine position.  Area sterilized and draped in usual position.  Local infused to area previously marked.  Necrotic, non-bleeding Tissue removed from wound bed completely using sharp dissection down to visible, bleeding tissue and fascia at edges as well as base. Purulent material all drained. Aggregate tissue removal as noted above.   Wound hemostasis noted, irrigated, then dressed with adaptec, 4x4 gauze and secured in place wit kerlex roll.  Pt tolerated procedure well, and transferred to PACU in stable condition. Sponge and instrument count correct at end of procedure.

## 2021-11-03 ENCOUNTER — Encounter: Payer: Self-pay | Admitting: Surgery

## 2021-11-03 LAB — AEROBIC CULTURE W GRAM STAIN (SUPERFICIAL SPECIMEN)

## 2021-11-03 NOTE — Progress Notes (Signed)
Robert Brennan ([id number]) Visit Report for 10/31/2021 Chief Complaint Document Details Patient Name: Robert Brennan, Robert Brennan Date of Service: 10/31/2021 8:45 AM Medical Record Number: [id number] Patient Account Number: [id number] Date of Birth/Sex: July 03, 1971 (51 y.o. M) Treating RN: Robert Brennan Primary Care Provider: Vance Brennan Other Clinician: Referring Provider: Rosetta Brennan Treating Provider/Extender: Robert Brennan in Treatment: 0 Information Obtained from: Patient Chief Complaint Left LE Hematoma and Ulcer Electronic Signature(s) Signed: 10/31/2021 9:44:25 AM By: Robert Kelp PA-C Entered By: Robert Brennan on 10/31/2021 09:44:25 Robert Brennan, Robert Brennan ([id number]) -------------------------------------------------------------------------------- Debridement Details Patient Name: Robert Brennan Date of Service: 10/31/2021 8:45 AM Medical Record Number: [id number] Patient Account Number: [id number] Date of Birth/Sex: September 23, 1971 (51 y.o. M) Treating RN: Robert Brennan Primary Care Provider: Vance Brennan Other Clinician: Referring Provider: Rosetta Brennan Treating Provider/Extender: Robert Brennan in Treatment: 0 Debridement Performed for Wound #1 Left,Lateral Lower Leg Assessment: Performed By: Physician Robert Meuse., PA-C Debridement Type: Chemical/Enzymatic/Mechanical Agent Used: Saline and gauze Severity of Tissue Pre Debridement: Fat layer exposed Level of Consciousness (Pre- Awake and Alert procedure): Pre-procedure Verification/Time Out Yes - 09:50 Taken: Start Time: 09:50 Pain Control: Lidocaine 4% Topical Solution Instrument: Other : saline and gauze Bleeding: Minimum Hemostasis Achieved: Pressure End Time: 09:53 Procedural Pain: 0 Post Procedural Pain: 0 Response to Treatment: Procedure was tolerated well Level of Consciousness (Post- Awake and Alert procedure): Post Debridement Measurements of Total Wound Length: (cm) 4 Width: (cm) 5 Depth: (cm) 0.3 Volume: (cm)  4.712 Character of Wound/Ulcer Post Debridement: Improved Severity of Tissue Post Debridement: Fat layer exposed Post Procedure Diagnosis Same as Pre-procedure Electronic Signature(s) Signed: 11/01/2021 10:02:31 AM By: Robert Kelp PA-C Signed: 11/03/2021 8:26:07 AM By: Robert Pax RN Entered By: Robert Brennan on 10/31/2021 09:53:43 Robert Brennan ([id number]) -------------------------------------------------------------------------------- HPI Details Patient Name: Robert Brennan Date of Service: 10/31/2021 8:45 AM Medical Record Number: [id number] Patient Account Number: [id number] Date of Birth/Sex: 1971-07-25 (51 y.o. M) Treating RN: Robert Brennan Primary Care Provider: Vance Brennan Other Clinician: Referring Provider: Rosetta Brennan Treating Provider/Extender: Robert Brennan in Treatment: 0 History of Present Illness HPI Description: 10/31/2021 upon evaluation today patient appears to be doing poorly in regard to a wound on his right lower extremity. He tells me that in December he hit this on the wooden part of the bed when he was catching his little boy he was falling off the bed. He tells me that initially it was pretty painful but that he was dealing with it and it seemed to close up. Subsequently his little boy crawling on him was knocking the scabs off and eventually cause this to open up to start draining again in January of this year. Nonetheless it does appear to be significantly infected based on what I see currently. I do not see any evidence of infection systemically though locally this is obviously cellulitic. He has been on 2 rounds of Keflex and is currently on one right now. With that being said this is definitely not doing the job based on what I am seeing although he does tell me the redness was bigger I am can actually mark it today so we will keep an eye on this. In general he just is not feeling too well. He does have a history of diabetes mellitus type 2 he is actually on  medications for this and they made some recent adjustments. He has not been able to see has changed however currently. He also has hypertension which she is on medication for. Electronic Signature(s)  Signed: 10/31/2021 10:01:41 AM By: Robert KelpStone III, Sadaf Przybysz PA-C Entered By: Robert Brennan on 10/31/2021 10:01:40 Robert Brennan, Robert Brennan ([id number]) -------------------------------------------------------------------------------- Physical Exam Details Patient Name: Robert Brennan, Robert Brennan Date of Service: 10/31/2021 8:45 AM Medical Record Number: [id number] Patient Account Number: [id number] Date of Birth/Sex: 06/06/1971 (51 y.o. M) Treating RN: Robert PaxEpps, Robert Primary Care Provider: Vance PeperMATHIAS, Brennan Other Clinician: Referring Provider: Rosetta PosnerBaker, Brennan Treating Provider/Extender: Robert BlaseStone, Robert Brennan in Treatment: 0 Constitutional sitting or standing blood pressure is within target range for patient.. pulse regular and within target range for patient.Marland Kitchen. respirations regular, non- labored and within target range for patient.Marland Kitchen. temperature within target range for patient.. Well-nourished and well-hydrated in no acute distress. Eyes conjunctiva clear no eyelid edema noted. pupils equal round and reactive to light and accommodation. Ears, Nose, Mouth, and Throat no gross abnormality of ear auricles or external auditory canals. normal hearing noted during conversation. mucus membranes moist. Respiratory normal breathing without difficulty. Cardiovascular 2+ dorsalis pedis/posterior tibialis pulses. no clubbing, cyanosis, significant edema, <3 sec cap refill. Musculoskeletal normal gait and posture. no significant deformity or arthritic changes, no loss or range of motion, no clubbing. Psychiatric this patient is able to make decisions and demonstrates good insight into disease process. Alert and Oriented x 3. pleasant and cooperative. Notes Upon inspection patient's wound bed actually showed signs of good granulation epithelization at  this point. Fortunately I do not see any evidence of active infection locally or upon evaluation today patient has significant issues currently with cellulitis of the right lower extremity surrounding the wound itself. He is actually more tender around the edges of the wound as opposed even just being over the wound itself. Nonetheless I am concerned about the fact that this patient does have significant cellulitis the Keflex is obviously not taking care of this. I do believe that we need to try to get this under better control. I see a significant amount of necrotic tissue in the base of the wound and honestly I think that this wound actually extends significantly deeper than what would be otherwise expected. However all the tissue I see does not appear to be very healthy at all. I do believe that with the amount of pain that he is experiencing right now is can be difficult for me here in the office to perform the appropriate debridement that he needs to get this cleaned away. For that reason I went to see about getting him into Dr. Tonna BoehringerSakai who is a general surgeon locally to see if he can clean out this wound a little bit better for us. Once cleaned more than happy to take over managing the wound to completion and healing. Electronic Signature(s) Signed: 10/31/2021 10:05:08 AM By: Robert KelpStone III, Sydney Azure PA-C Previous Signature: 10/31/2021 10:04:45 AM Version By: Robert KelpStone III, Hoorain Kozakiewicz PA-C Entered By: Robert KelpStone III, Emmersyn Kratzke on 10/31/2021 10:05:08 Robert Brennan, Robert Brennan ([id number]) -------------------------------------------------------------------------------- Physician Orders Details Patient Name: Robert Brennan, Robert Brennan Date of Service: 10/31/2021 8:45 AM Medical Record Number: [id number] Patient Account Number: [id number] Date of Birth/Sex: 06/24/1971 (50 y.o. M) Treating RN: Robert PaxEpps, Robert Primary Care Provider: Vance PeperMATHIAS, Brennan Other Clinician: Referring Provider: Rosetta PosnerBaker, Brennan Treating Provider/Extender: Robert BlaseStone, Eithel Ryall Brennan in Treatment:  0 Verbal / Phone Orders: No Diagnosis Coding ICD-10 Coding Code Description E11.622 Type 2 diabetes mellitus with other skin ulcer T79.2XXA Traumatic secondary and recurrent hemorrhage and seroma, initial encounter L97.822 Non-pressure chronic ulcer of other part of left lower leg with fat layer exposed I10 Essential (primary) hypertension Follow-up Appointments o Return Appointment in 1 week. Bathing/ Sport and exercise psychologisthower/ Hygiene   o May shower; gently cleanse wound with antibacterial soap, rinse and pat dry prior to dressing wounds Anesthetic (Use 'Patient Medications' Section for Anesthetic Order Entry) o Lidocaine applied to wound bed Edema Control - Lymphedema / Segmental Compressive Device / Other o Elevate, Exercise Daily and Avoid Standing for Long Periods of Time. o Elevate legs to the level of the heart and pump ankles as often as possible o Elevate leg(s) parallel to the floor when sitting. Wound Treatment Wound #1 - Lower Leg Wound Laterality: Left, Lateral Primary Dressing: Iodosorb Gel Tube, 10 (g) (DME) (Generic) Every Other Day/30 Days Secondary Dressing: Zetuvit Plus Silicone Non-bordered 5x5 (in/in) (DME) (Generic) Every Other Day/30 Days Consults o Other Physician Referral - Dr Tonna Boehringer surgeon - (ICD10 [phone] - Type 2 diabetes mellitus with other skin ulcer) Laboratory o Bacteria identified in Wound by Culture (MICRO) - non healing wound with red periwound and pain - (ICD10 E11.622 - Type 2 diabetes mellitus with other skin ulcer) oooo LOINC Code: 6462-6 oooo Convenience Name: Wound culture routine Patient Medications Allergies: No Known Allergies Notifications Medication Indication Start End Levaquin 11/01/2021 DOSE 1 - oral 750 mg tablet - 1 tablet oral taken 1 time per day for 14 days Electronic Signature(s) Signed: 11/01/2021 10:07:27 AM By: Chong Sicilian, Chares ([id number]) Previous Signature: 11/01/2021 10:02:31 AM Version By: Robert Kelp  PA-C Entered By: Robert Brennan on 11/01/2021 10:07:27 Tate, Nasean ([id number]) -------------------------------------------------------------------------------- Problem List Details Patient Name: Robert Brennan, Robert Brennan Date of Service: 10/31/2021 8:45 AM Medical Record Number: [id number] Patient Account Number: [id number] Date of Birth/Sex: 10-09-1970 (51 y.o. M) Treating RN: Robert Brennan Primary Care Provider: Vance Brennan Other Clinician: Referring Provider: Rosetta Brennan Treating Provider/Extender: Robert Brennan in Treatment: 0 Active Problems ICD-10 Encounter Code Description Active Date MDM Diagnosis E11.622 Type 2 diabetes mellitus with other skin ulcer 10/31/2021 No Yes T79.2XXA Traumatic secondary and recurrent hemorrhage and seroma, initial 10/31/2021 No Yes encounter L03.115 Cellulitis of right lower limb 10/31/2021 No Yes L97.822 Non-pressure chronic ulcer of other part of left lower leg with fat layer 10/31/2021 No Yes exposed I10 Essential (primary) hypertension 10/31/2021 No Yes Inactive Problems Resolved Problems Electronic Signature(s) Signed: 10/31/2021 10:03:34 AM By: Robert Kelp PA-C Previous Signature: 10/31/2021 9:44:06 AM Version By: Robert Kelp PA-C Previous Signature: 10/31/2021 9:42:52 AM Version By: Robert Kelp PA-C Entered By: Robert Brennan on 10/31/2021 10:03:34 Laiche, Maggie ([id number]) -------------------------------------------------------------------------------- Progress Note Details Patient Name: Robert Brennan, Robert Brennan Date of Service: 10/31/2021 8:45 AM Medical Record Number: [id number] Patient Account Number: [id number] Date of Birth/Sex: 1970/10/13 (51 y.o. M) Treating RN: Robert Brennan Primary Care Provider: Vance Brennan Other Clinician: Referring Provider: Rosetta Brennan Treating Provider/Extender: Robert Brennan in Treatment: 0 Subjective Chief Complaint Information obtained from Patient Left LE Hematoma and Ulcer History of Present Illness  (HPI) 10/31/2021 upon evaluation today patient appears to be doing poorly in regard to a wound on his right lower extremity. He tells me that in December he hit this on the wooden part of the bed when he was catching his little boy he was falling off the bed. He tells me that initially it was pretty painful but that he was dealing with it and it seemed to close up. Subsequently his little boy crawling on him was knocking the scabs off and eventually cause this to open up to start draining again in January of this year. Nonetheless it does appear to be significantly infected based on what I see currently. I  do not see any evidence of infection systemically though locally this is obviously cellulitic. He has been on 2 rounds of Keflex and is currently on one right now. With that being said this is definitely not doing the job based on what I am seeing although he does tell me the redness was bigger I am can actually mark it today so we will keep an eye on this. In general he just is not feeling too well. He does have a history of diabetes mellitus type 2 he is actually on medications for this and they made some recent adjustments. He has not been able to see has changed however currently. He also has hypertension which she is on medication for. Patient History Allergies No Known Allergies Social History Never smoker, Marital Status - Married, Alcohol Use - Never, Drug Use - No History, Caffeine Use - Rarely. Medical History Cardiovascular Patient has history of Hypertension Endocrine Patient has history of Type II Diabetes Patient is treated with Insulin, Oral Agents. Review of Systems (ROS) Eyes Complains or has symptoms of Glasses / Contacts. Integumentary (Skin) Complains or has symptoms of Wounds. Objective Constitutional sitting or standing blood pressure is within target range for patient.. pulse regular and within target range for patient.Marland Kitchen. respirations regular, non- labored and  within target range for patient.Marland Kitchen. temperature within target range for patient.. Well-nourished and well-hydrated in no acute distress. Vitals Time Taken: 8:55 AM, Height: 72 in, Source: Stated, Weight: 273 lbs, Source: Stated, BMI: 37, Temperature: 98.5 F, Pulse: 109 bpm, Respiratory Rate: 18 breaths/min, Blood Pressure: 139/88 mmHg. Eyes conjunctiva clear no eyelid edema noted. pupils equal round and reactive to light and accommodation. Ears, Nose, Mouth, and Throat no gross abnormality of ear auricles or external auditory canals. normal hearing noted during conversation. mucus membranes moist. Respiratory Juenger, Phineas ([id number]) normal breathing without difficulty. Cardiovascular 2+ dorsalis pedis/posterior tibialis pulses. no clubbing, cyanosis, significant edema, Musculoskeletal normal gait and posture. no significant deformity or arthritic changes, no loss or range of motion, no clubbing. Psychiatric this patient is able to make decisions and demonstrates good insight into disease process. Alert and Oriented x 3. pleasant and cooperative. General Notes: Upon inspection patient's wound bed actually showed signs of good granulation epithelization at this point. Fortunately I do not see any evidence of active infection locally or upon evaluation today patient has significant issues currently with cellulitis of the right lower extremity surrounding the wound itself. He is actually more tender around the edges of the wound as opposed even just being over the wound itself. Nonetheless I am concerned about the fact that this patient does have significant cellulitis the Keflex is obviously not taking care of this. I do believe that we need to try to get this under better control. I see a significant amount of necrotic tissue in the base of the wound and honestly I think that this wound actually extends significantly deeper than what would be otherwise expected. However all the tissue I see does  not appear to be very healthy at all. I do believe that with the amount of pain that he is experiencing right now is can be difficult for me here in the office to perform the appropriate debridement that he needs to get this cleaned away. For that reason I went to see about getting him into Dr. Tonna BoehringerSakai who is a general surgeon locally to see if he can clean out this wound a little bit better for us. Once cleaned more than happy to  take over managing the wound to completion and healing. Integumentary (Hair, Skin) Wound #1 status is Open. Original cause of wound was Trauma. The date acquired was: 09/25/2021. The wound is located on the Left,Lateral Lower Leg. The wound measures 4cm length x 5cm width x 0.3cm depth; 15.708cm^2 area and 4.712cm^3 volume. There is Fat Layer (Subcutaneous Tissue) exposed. There is no tunneling or undermining noted. There is a large amount of serosanguineous drainage noted. There is small (1-33%) red, pink granulation within the wound bed. There is a large (67-100%) amount of necrotic tissue within the wound bed including Adherent Slough. Assessment Active Problems ICD-10 Type 2 diabetes mellitus with other skin ulcer Traumatic secondary and recurrent hemorrhage and seroma, initial encounter Cellulitis of right lower limb Non-pressure chronic ulcer of other part of left lower leg with fat layer exposed Essential (primary) hypertension Procedures Wound #1 Pre-procedure diagnosis of Wound #1 is a Diabetic Wound/Ulcer of the Lower Extremity located on the Left,Lateral Lower Leg .Severity of Tissue Pre Debridement is: Fat layer exposed. There was a Chemical/Enzymatic/Mechanical debridement performed by Robert Meuse., PA-C. With the following instrument(s): saline and gauze after achieving pain control using Lidocaine 4% Topical Solution. Other agent used was Saline and gauze. A time out was conducted at 09:50, prior to the start of the procedure. A Minimum amount of  bleeding was controlled with Pressure. The procedure was tolerated well with a pain level of 0 throughout and a pain level of 0 following the procedure. Post Debridement Measurements: 4cm length x 5cm width x 0.3cm depth; 4.712cm^3 volume. Character of Wound/Ulcer Post Debridement is improved. Severity of Tissue Post Debridement is: Fat layer exposed. Post procedure Diagnosis Wound #1: Same as Pre-Procedure Plan Follow-up Appointments: Return Appointment in 1 week. Bathing/ Shower/ Hygiene: May shower; gently cleanse wound with antibacterial soap, rinse and pat dry prior to dressing wounds Anesthetic (Use 'Patient Medications' Section for Anesthetic Order Entry): Lidocaine applied to wound bed Edema Control - Lymphedema / Segmental Compressive Device / Other: Elevate, Exercise Daily and Avoid Standing for Long Periods of Time. Ask, Robert Brennan ([id number]) Elevate legs to the level of the heart and pump ankles as often as possible Elevate leg(s) parallel to the floor when sitting. Laboratory ordered were: Wound culture routine - non healing wound with red periwound and pain Consults ordered were: Other Physician Referral - Dr Tonna Boehringer surgeon WOUND #1: - Lower Leg Wound Laterality: Left, Lateral Primary Dressing: Iodosorb Gel Tube, 10 (g) (DME) (Generic) Every Other Day/30 Days Secondary Dressing: Zetuvit Plus Silicone Non-bordered 5x5 (in/in) (DME) (Generic) Every Other Day/30 Days 1. I am going to go ahead and put in a referral to Dr. Tonna Boehringer who is a local general surgeon in order to see if he could help with debridement of this wound. Again due to the amount of pain and what we are seeing with regard to the patient at this point I feel like that this is good to be difficult task for me to undertake here in the clinic alone. I think that he may need a little bit more extensive debridement than what I can offer at this point. I do believe however there is significant amount of necrotic tissue that  needs to be cleaned away. All this was discussed with the patient and he is in agreement with the plan. We will go ahead and make this referral. Once he has the surgery to clean this out will be happy to continue to follow him for wound care. 2. In the  meantime we will go ahead as well and initiate treatment with Iodosorb which I think is a good option here. 3. I am also going to recommend a Zetuvit bordered foam dressing to secure in place. 4. I also am going to send in a wound culture to put on the results of the culture will make any additional changes as needed right now I am having him stop the Keflex and start Bactrim DS which I feel like will be a better antibiotic currently. Especially since he has been on 2 rounds of the Keflex and still has significant cellulitis obvious on physical examination today. We will see patient back for reevaluation in 1 week here in the clinic. If anything worsens or changes patient will contact our office for additional recommendations. Electronic Signature(s) Signed: 10/31/2021 10:07:19 AM By: Robert Kelp PA-C Entered By: Robert Brennan on 10/31/2021 10:07:19 Robert Brennan, Robert Brennan ([id number]) -------------------------------------------------------------------------------- ROS/PFSH Details Patient Name: Robert Brennan Date of Service: 10/31/2021 8:45 AM Medical Record Number: [id number] Patient Account Number: [id number] Date of Birth/Sex: 1971/07/31 (51 y.o. M) Treating RN: Robert Brennan Primary Care Provider: Vance Brennan Other Clinician: Referring Provider: Rosetta Brennan Treating Provider/Extender: Robert Brennan in Treatment: 0 Eyes Complaints and Symptoms: Positive for: Glasses / Contacts Integumentary (Skin) Complaints and Symptoms: Positive for: Wounds Cardiovascular Medical History: Positive for: Hypertension Endocrine Medical History: Positive for: Type II Diabetes Time with diabetes: 21 Treated with: Insulin, Oral  agents Immunizations Pneumococcal Vaccine: Received Pneumococcal Vaccination: No Implantable Devices None Family and Social History Never smoker; Marital Status - Married; Alcohol Use: Never; Drug Use: No History; Caffeine Use: Rarely; Financial Concerns: No; Food, Clothing or Shelter Needs: No; Support System Lacking: No; Transportation Concerns: No Electronic Signature(s) Signed: 11/01/2021 10:02:31 AM By: Robert Kelp PA-C Signed: 11/03/2021 8:26:07 AM By: Robert Pax RN Entered By: Robert Brennan on 10/31/2021 08:58:26 Elamin, Vernard ([id number]) -------------------------------------------------------------------------------- SuperBill Details Patient Name: Robert Brennan Eastin Date of Service: 10/31/2021 Medical Record Number: [id number] Patient Account Number: [id number] Date of Birth/Sex: Dec 31, 1970 (51 y.o. M) Treating RN: Robert Brennan Primary Care Provider: Vance Brennan Other Clinician: Referring Provider: Rosetta Brennan Treating Provider/Extender: Robert Brennan in Treatment: 0 Diagnosis Coding ICD-10 Codes Code Description E11.622 Type 2 diabetes mellitus with other skin ulcer T79.2XXA Traumatic secondary and recurrent hemorrhage and seroma, initial encounter L97.822 Non-pressure chronic ulcer of other part of left lower leg with fat layer exposed I10 Essential (primary) hypertension Facility Procedures CPT4 Code: [id number] Description: 99214 - WOUND CARE VISIT-LEV 4 EST PT Modifier: Quantity: 1 Physician Procedures CPT4 Code: [id number] Description: 99204 - WC PHYS LEVEL 4 - NEW PT Modifier: Quantity: 1 CPT4 Code: Description: ICD-10 Diagnosis Description E11.622 Type 2 diabetes mellitus with other skin ulcer T79.2XXA Traumatic secondary and recurrent hemorrhage and seroma, initial encount L97.822 Non-pressure chronic ulcer of other part of left lower leg with fat  laye I10 Essential (primary) hypertension Modifier: er r exposed Quantity: Electronic Signature(s) Signed:  10/31/2021 10:07:38 AM By: Robert Kelp PA-C Entered By: Robert Brennan on 10/31/2021 10:07:38

## 2021-11-03 NOTE — Progress Notes (Addendum)
SKUBAL, Saatvik ([id number]) Visit Report for 10/31/2021 Allergy List Details Patient Name: Robert Brennan, Robert Brennan Date of Service: 10/31/2021 8:45 AM Medical Record Number: [id number] Patient Account Number: [id number] Date of Birth/Sex: [birth year] (51 y.o. M) Treating RN: Carlene Coria Primary Care Ringo Sherod: Darrall Dears Other Clinician: Referring Ahnika Hannibal: Caroline More Treating Mairim Bade/Extender: Skipper Cliche in Treatment: 0 Allergies Active Allergies No Known Allergies Allergy Notes Electronic Signature(s) Signed: 11/03/2021 8:26:07 AM By: Carlene Coria RN Entered By: Carlene Coria on 10/31/2021 08:56:26 Robert Brennan, Robert Brennan ([id number]) -------------------------------------------------------------------------------- Arrival Information Details Patient Name: Robert Brennan Date of Service: 10/31/2021 8:45 AM Medical Record Number: [id number] Patient Account Number: [id number] Date of Birth/Sex: 06/14/1971 (51 y.o. M) Treating RN: Carlene Coria Primary Care Gaege Sangalang: Darrall Dears Other Clinician: Referring Robyne Matar: Caroline More Treating Naava Janeway/Extender: Skipper Cliche in Treatment: 0 Visit Information Patient Arrived: Ambulatory Arrival Time: 08:50 Accompanied By: self Transfer Assistance: None Patient Identification Verified: Yes Secondary Verification Process Completed: Yes Patient Requires Transmission-Based Precautions: No Patient Has Alerts: No Electronic Signature(s) Signed: 11/03/2021 8:26:07 AM By: Carlene Coria RN Entered By: Carlene Coria on 10/31/2021 08:53:56 Robert Brennan, Robert Brennan ([id number]) -------------------------------------------------------------------------------- Clinic Level of Care Assessment Details Patient Name: Robert Brennan, Robert Brennan Date of Service: 10/31/2021 8:45 AM Medical Record Number: [id number] Patient Account Number: [id number] Date of Birth/Sex: May 15, 1971 (51 y.o. M) Treating RN: Carlene Coria Primary Care Aspen Lawrance: Darrall Dears Other Clinician: Referring Dema Timmons: Caroline More Treating Apollos Tenbrink/Extender: Skipper Cliche in Treatment: 0 Clinic Level of Care Assessment Items TOOL 2 Quantity Score X - Use when only an EandM is performed on the INITIAL visit 1 0 ASSESSMENTS - Nursing Assessment / Reassessment X - General Physical Exam (combine w/ comprehensive assessment (listed just below) when performed on new 1 20 pt. evals) X- 1 25 Comprehensive Assessment (HX, ROS, Risk Assessments, Wounds Hx, etc.) ASSESSMENTS - Wound and Skin Assessment / Reassessment X - Simple Wound Assessment / Reassessment - one wound 1 5 []  - 0 Complex Wound Assessment / Reassessment - multiple wounds []  - 0 Dermatologic / Skin Assessment (not related to wound area) ASSESSMENTS - Ostomy and/or Continence Assessment and Care []  - Incontinence Assessment and Management 0 []  - 0 Ostomy Care Assessment and Management (repouching, etc.) PROCESS - Coordination of Care X - Simple Patient / Family Education for ongoing care 1 15 []  - 0 Complex (extensive) Patient / Family Education for ongoing care X- 1 10 Staff obtains Programmer, systems, Records, Test Results / Process Orders []  - 0 Staff telephones HHA, Nursing Homes / Clarify orders / etc []  - 0 Routine Transfer to another Facility (non-emergent condition) []  - 0 Routine Hospital Admission (non-emergent condition) []  - 0 New Admissions / Biomedical engineer / Ordering NPWT, Apligraf, etc. []  - 0 Emergency Hospital Admission (emergent condition) X- 1 10 Simple Discharge Coordination []  - 0 Complex (extensive) Discharge Coordination PROCESS - Special Needs []  - Pediatric / Minor Patient Management 0 []  - 0 Isolation Patient Management []  - 0 Hearing / Language / Visual special needs []  - 0 Assessment of Community assistance (transportation, D/C planning, etc.) []  - 0 Additional assistance / Altered mentation []  - 0 Support Surface(s) Assessment (bed, cushion, seat, etc.) INTERVENTIONS - Wound Cleansing /  Measurement X - Wound Imaging (photographs - any number of wounds) 1 5 []  - 0 Wound Tracing (instead of photographs) X- 1 5 Simple Wound Measurement - one wound []  - 0 Complex Wound Measurement - multiple wounds Robert Brennan, Robert Brennan ([id number]) X- 1 5 Simple Wound Cleansing - one wound []  - 0  Complex Wound Cleansing - multiple wounds INTERVENTIONS - Wound Dressings []  - Small Wound Dressing one or multiple wounds 0 X- 1 15 Medium Wound Dressing one or multiple wounds []  - 0 Large Wound Dressing one or multiple wounds []  - 0 Application of Medications - injection INTERVENTIONS - Miscellaneous []  - External ear exam 0 []  - 0 Specimen Collection (cultures, biopsies, blood, body fluids, etc.) []  - 0 Specimen(s) / Culture(s) sent or taken to Lab for analysis []  - 0 Patient Transfer (multiple staff / Harrel Lemon Lift / Similar devices) []  - 0 Simple Staple / Suture removal (25 or less) []  - 0 Complex Staple / Suture removal (26 or more) []  - 0 Hypo / Hyperglycemic Management (close monitor of Blood Glucose) X- 1 15 Ankle / Brachial Index (ABI) - do not check if billed separately Has the patient been seen at the hospital within the last three years: Yes Total Score: 130 Level Of Care: New/Established - Level 4 Electronic Signature(s) Signed: 11/03/2021 8:26:07 AM By: Carlene Coria RN Entered By: Carlene Coria on 10/31/2021 09:57:08 Robert Brennan, Robert Brennan ([id number]) -------------------------------------------------------------------------------- Lower Extremity Assessment Details Patient Name: Robert Brennan, Robert Brennan Date of Service: 10/31/2021 8:45 AM Medical Record Number: [id number] Patient Account Number: [id number] Date of Birth/Sex: 1971/01/06 (51 y.o. M) Treating RN: Carlene Coria Primary Care Sherrica Niehaus: Darrall Dears Other Clinician: Referring Samai Corea: Caroline More Treating Emric Kowalewski/Extender: Skipper Cliche in Treatment: 0 Edema Assessment Assessed: [Left: No] [Right: No] Edema: [Left: Ye] [Right:  s] Calf Left: Right: Point of Measurement: 38 cm From Medial Instep 40 cm Ankle Left: Right: Point of Measurement: 12 cm From Medial Instep 25 cm Knee To Floor Left: Right: From Medial Instep 49 cm Vascular Assessment Pulses: Dorsalis Pedis Palpable: [Right:Yes] Blood Pressure: Brachial: [Right:139] Ankle: [Right:Dorsalis Pedis: 160 1.15] Electronic Signature(s) Signed: 10/31/2021 9:42:36 AM By: Carlene Coria RN Entered By: Carlene Coria on 10/31/2021 09:42:36 Robert Brennan, Robert Brennan ([id number]) -------------------------------------------------------------------------------- Multi Wound Chart Details Patient Name: Robert Brennan, Robert Brennan Date of Service: 10/31/2021 8:45 AM Medical Record Number: [id number] Patient Account Number: [id number] Date of Birth/Sex: 10-May-1971 (51 y.o. M) Treating RN: Carlene Coria Primary Care Jacklin Zwick: Darrall Dears Other Clinician: Referring Hiran Leard: Caroline More Treating Harjot Zavadil/Extender: Skipper Cliche in Treatment: 0 Vital Signs Height(in): 72 Pulse(bpm): 109 Weight(lbs): 273 Blood Pressure(mmHg): 139/88 Body Mass Index(BMI): 37 Temperature(F): 98.5 Respiratory Rate(breaths/min): 18 Photos: [N/A:N/A] Wound Location: Left, Lateral Lower Leg N/A N/A Wounding Event: Trauma N/A N/A Primary Etiology: Diabetic Wound/Ulcer of the Lower N/A N/A Extremity Comorbid History: Hypertension, Type II Diabetes N/A N/A Date Acquired: 09/25/2021 N/A N/A Weeks of Treatment: 0 N/A N/A Wound Status: Open N/A N/A Wound Recurrence: No N/A N/A Measurements L x W x D (cm) 4x5x0.3 N/A N/A Area (cm) : 15.708 N/A N/A Volume (cm) : 4.712 N/A N/A Classification: Grade 2 N/A N/A Exudate Amount: Large N/A N/A Exudate Type: Serosanguineous N/A N/A Exudate Color: red, brown N/A N/A Granulation Amount: Small (1-33%) N/A N/A Granulation Quality: Red, Pink N/A N/A Necrotic Amount: Large (67-100%) N/A N/A Exposed Structures: Fat Layer (Subcutaneous Tissue): N/A N/A Yes Fascia:  No Tendon: No Muscle: No Joint: No Bone: No Epithelialization: None N/A N/A Debridement: Chemical/Enzymatic/Mechanical N/A N/A Pre-procedure Verification/Time 09:50 N/A N/A Out Taken: Pain Control: Lidocaine 4% Topical Solution N/A N/A Instrument: Other(saline and gauze) N/A N/A Bleeding: Minimum N/A N/A Hemostasis Achieved: Pressure N/A N/A Procedural Pain: 0 N/A N/A Post Procedural Pain: 0 N/A N/A Debridement Treatment Procedure was tolerated well N/A N/A Response: Post Debridement 4x5x0.3 N/A N/A Measurements L x W  x D (cm) Post Debridement Volume: 4.712 N/A N/A (cm) Procedures Performed: Debridement N/A N/A Robert Brennan, Robert Brennan ([id number]) Treatment Notes Electronic Signature(s) Signed: 10/31/2021 11:31:47 AM By: Carlene Coria RN Entered By: Carlene Coria on 10/31/2021 11:31:47 Robert Brennan, Robert Brennan ([id number]) -------------------------------------------------------------------------------- Multi-Disciplinary Care Plan Details Patient Name: Robert Brennan, Robert Brennan Date of Service: 10/31/2021 8:45 AM Medical Record Number: [id number] Patient Account Number: [id number] Date of Birth/Sex: Nov 10, 1970 (51 y.o. M) Treating RN: Carlene Coria Primary Care Ottilie Wigglesworth: Darrall Dears Other Clinician: Referring Jonetta Dagley: Caroline More Treating Sharyl Panchal/Extender: Skipper Cliche in Treatment: 0 Active Inactive Electronic Signature(s) Signed: 11/23/2021 3:47:36 PM By: Gretta Cool, BSN, RN, CWS, Kim RN, BSN Signed: 11/30/2021 12:55:13 PM By: Carlene Coria RN Previous Signature: 11/03/2021 8:26:07 AM Version By: Carlene Coria RN Entered By: Gretta Cool BSN, RN, CWS, Kim on 11/23/2021 15:47:35 Robert Brennan, Jhonnie ([id number]) -------------------------------------------------------------------------------- Pain Assessment Details Patient Name: Robert Brennan Taevon Date of Service: 10/31/2021 8:45 AM Medical Record Number: [id number] Patient Account Number: [id number] Date of Birth/Sex: [birth year] (51 y.o. M) Treating RN: Carlene Coria Primary Care  Jerett Odonohue: Darrall Dears Other Clinician: Referring Jacobo Moncrief: Caroline More Treating Annelle Behrendt/Extender: Skipper Cliche in Treatment: 0 Active Problems Location of Pain Severity and Description of Pain Patient Has Paino Yes Site Locations With Dressing Change: Yes Duration of the Pain. Constant / Intermittento Intermittent Rate the pain. Current Pain Level: 7 Worst Pain Level: 10 Least Pain Level: 0 Tolerable Pain Level: 5 Character of Pain Describe the Pain: Throbbing Pain Management and Medication Current Pain Management: Medication: Yes Cold Application: No Rest: Yes Massage: No Activity: No T.E.N.S.: No Heat Application: No Leg drop or elevation: No Is the Current Pain Management Adequate: Inadequate How does your wound impact your activities of daily livingo Sleep: Yes Bathing: No Appetite: Yes Relationship With Others: No Bladder Continence: No Emotions: No Bowel Continence: No Work: No Toileting: No Drive: No Dressing: No Hobbies: No Electronic Signature(s) Signed: 11/03/2021 8:26:07 AM By: Carlene Coria RN Entered By: Carlene Coria on 10/31/2021 08:55:24 Woehler, Pike ([id number]) -------------------------------------------------------------------------------- Patient/Caregiver Education Details Patient Name: Robert Brennan Said Date of Service: 10/31/2021 8:45 AM Medical Record Number: [id number] Patient Account Number: [id number] Date of Birth/Gender: 31-Oct-1970 (51 y.o. M) Treating RN: Carlene Coria Primary Care Physician: Darrall Dears Other Clinician: Referring Physician: Caroline More Treating Physician/Extender: Skipper Cliche in Treatment: 0 Education Assessment Education Provided To: Patient Education Topics Provided Wound/Skin Impairment: Methods: Explain/Verbal Responses: State content correctly Electronic Signature(s) Signed: 11/03/2021 8:26:07 AM By: Carlene Coria RN Entered By: Carlene Coria on 10/31/2021 09:58:40 Satcher, Ilija  ([id number]) -------------------------------------------------------------------------------- Wound Assessment Details Patient Name: Robert Brennan, Ingvald Date of Service: 10/31/2021 8:45 AM Medical Record Number: [id number] Patient Account Number: [id number] Date of Birth/Sex: Sep 18, 1971 (51 y.o. M) Treating RN: Carlene Coria Primary Care Shaquila Sigman: Darrall Dears Other Clinician: Referring Clancy Leiner: Caroline More Treating Jaxsin Bottomley/Extender: Skipper Cliche in Treatment: 0 Wound Status Wound Number: 1 Primary Etiology: Diabetic Wound/Ulcer of the Lower Extremity Wound Location: Left, Lateral Lower Leg Wound Status: Open Wounding Event: Trauma Comorbid History: Hypertension, Type II Diabetes Date Acquired: 09/25/2021 Weeks Of Treatment: 0 Clustered Wound: No Photos Wound Measurements Length: (cm) 4 Width: (cm) 5 Depth: (cm) 0.3 Area: (cm) 15.708 Volume: (cm) 4.712 % Reduction in Area: % Reduction in Volume: Epithelialization: None Tunneling: No Undermining: No Wound Description Classification: Grade 2 Exudate Amount: Large Exudate Type: Serosanguineous Exudate Color: red, brown Foul Odor After Cleansing: No Slough/Fibrino Yes Wound Bed Granulation Amount: Small (1-33%) Exposed Structure Granulation Quality: Red, Pink Fascia Exposed: No Necrotic Amount: Large (67-100%) Fat Layer (Subcutaneous Tissue)  Exposed: Yes Necrotic Quality: Adherent Slough Tendon Exposed: No Muscle Exposed: No Joint Exposed: No Bone Exposed: No Electronic Signature(s) Signed: 11/03/2021 8:26:07 AM By: Carlene Coria RN Entered By: Carlene Coria on 10/31/2021 09:14:21 [place], Theoren ([id number]) -------------------------------------------------------------------------------- Gerber Details Patient Name: Robert Brennan Dolphus Date of Service: 10/31/2021 8:45 AM Medical Record Number: [id number] Patient Account Number: [id number] Date of Birth/Sex: [birth year] (51 y.o. M) Treating RN: Carlene Coria Primary Care Raynah Gomes:  Darrall Dears Other Clinician: Referring Jeneen Doutt: Caroline More Treating Valeree Leidy/Extender: Skipper Cliche in Treatment: 0 Vital Signs Time Taken: 08:55 Temperature (F): 98.5 Height (in): 72 Pulse (bpm): 109 Source: Stated Respiratory Rate (breaths/min): 18 Weight (lbs): 273 Blood Pressure (mmHg): 139/88 Source: Stated Reference Range: 80 - 120 mg / dl Body Mass Index (BMI): 37 Electronic Signature(s) Signed: 11/03/2021 8:26:07 AM By: Carlene Coria RN Entered By: Carlene Coria on 10/31/2021 08:56:08

## 2021-11-03 NOTE — Progress Notes (Signed)
AULD, Everrett ([id number]) Visit Report for 10/31/2021 Abuse Risk Screen Details Patient Name: Robert Brennan, Robert Brennan Date of Service: 10/31/2021 8:45 AM Medical Record Number: [id number] Patient Account Number: [id number] Date of Birth/Sex: June 11, 1971 (51 y.o. M) Treating RN: Yevonne Pax Primary Care Kayson Bullis: Vance Peper Other Clinician: Referring Sunjai Levandoski: Rosetta Posner Treating Kalin Kyler/Extender: Rowan Blase in Treatment: 0 Abuse Risk Screen Items Answer ABUSE RISK SCREEN: Has anyone close to you tried to hurt or harm you recentlyo No Do you feel uncomfortable with anyone in your familyo No Has anyone forced you do things that you didnot want to doo No Electronic Signature(s) Signed: 11/03/2021 8:26:07 AM By: Yevonne Pax RN Entered By: Yevonne Pax on 10/31/2021 08:58:36 Robert Brennan, Robert Brennan ([id number]) -------------------------------------------------------------------------------- Activities of Daily Living Details Patient Name: Robert Brennan, Robert Brennan Date of Service: 10/31/2021 8:45 AM Medical Record Number: [id number] Patient Account Number: [id number] Date of Birth/Sex: [birth year] (51 y.o. M) Treating RN: Yevonne Pax Primary Care Wali Reinheimer: Vance Peper Other Clinician: Referring Arien Morine: Rosetta Posner Treating Miciah Covelli/Extender: Rowan Blase in Treatment: 0 Activities of Daily Living Items Answer Activities of Daily Living (Please select one for each item) Drive Automobile Completely Able Take Medications Completely Able Use Telephone Completely Able Care for Appearance Completely Able Use Toilet Completely Able Bath / Shower Completely Able Dress Self Completely Able Feed Self Completely Able Walk Completely Able Get In / Out Bed Completely Able Housework Completely Able Prepare Meals Completely Able Handle Money Completely Able Shop for Self Completely Able Electronic Signature(s) Signed: 11/03/2021 8:26:07 AM By: Yevonne Pax RN Entered By: Yevonne Pax on 10/31/2021  08:58:57 Teicher, Devontay ([id number]) -------------------------------------------------------------------------------- Education Screening Details Patient Name: Robert Brennan, Robert Brennan Date of Service: 10/31/2021 8:45 AM Medical Record Number: [id number] Patient Account Number: [id number] Date of Birth/Sex: January 18, 1971 (51 y.o. M) Treating RN: Yevonne Pax Primary Care Neta Upadhyay: Vance Peper Other Clinician: Referring Landin Tallon: Rosetta Posner Treating Maggie Dworkin/Extender: Rowan Blase in Treatment: 0 Primary Learner Assessed: Patient Learning Preferences/Education Level/Primary Language Learning Preference: Explanation Highest Education Level: College or Above Preferred Language: English Cognitive Barrier Language Barrier: No Translator Needed: No Memory Deficit: No Emotional Barrier: No Cultural/Religious Beliefs Affecting Medical Care: No Physical Barrier Impaired Vision: Yes Glasses Impaired Hearing: No Decreased Hand dexterity: No Knowledge/Comprehension Knowledge Level: Medium Comprehension Level: High Ability to understand written instructions: High Ability to understand verbal instructions: High Motivation Anxiety Level: Anxious Cooperation: Cooperative Education Importance: Acknowledges Need Interest in Health Problems: Asks Questions Perception: Coherent Willingness to Engage in Self-Management High Activities: Readiness to Engage in Self-Management High Activities: Electronic Signature(s) Signed: 11/03/2021 8:26:07 AM By: Yevonne Pax RN Entered By: Yevonne Pax on 10/31/2021 08:59:25 Oliver, Daryus ([id number]) -------------------------------------------------------------------------------- Fall Risk Assessment Details Patient Name: Robert Brennan, Robert Brennan Date of Service: 10/31/2021 8:45 AM Medical Record Number: [id number] Patient Account Number: [id number] Date of Birth/Sex: 10/04/1970 (50 y.o. M) Treating RN: Yevonne Pax Primary Care Omya Winfield: Vance Peper Other  Clinician: Referring Wyat Infinger: Rosetta Posner Treating Raffi Milstein/Extender: Rowan Blase in Treatment: 0 Fall Risk Assessment Items Have you had 2 or more falls in the last 12 monthso 0 Yes Have you had any fall that resulted in injury in the last 12 monthso 0 Yes FALLS RISK SCREEN History of falling - immediate or within 3 months 25 Yes Secondary diagnosis (Do you have 2 or more medical diagnoseso) 0 No Ambulatory aid None/bed rest/wheelchair/nurse 0 No Crutches/cane/walker 0 No Furniture 0 No Intravenous therapy Access/Saline/Heparin Lock 0 No Gait/Transferring Normal/ bed rest/ wheelchair 0 No Weak (short steps with or without shuffle, stooped but able to  lift head while walking, may 0 No seek support from furniture) Impaired (short steps with shuffle, may have difficulty arising from chair, head down, impaired 0 No balance) Mental Status Oriented to own ability 0 No Electronic Signature(s) Signed: 11/03/2021 8:26:07 AM By: Yevonne Pax RN Entered By: Yevonne Pax on 10/31/2021 08:59:33 Robert Brennan, Robert Brennan ([id number]) -------------------------------------------------------------------------------- Foot Assessment Details Patient Name: Robert Brennan, Robert Brennan Date of Service: 10/31/2021 8:45 AM Medical Record Number: [id number] Patient Account Number: [id number] Date of Birth/Sex: [birth year] (51 y.o. M) Treating RN: Yevonne Pax Primary Care Cylinda Santoli: Vance Peper Other Clinician: Referring Roxine Whittinghill: Rosetta Posner Treating Aleesia Henney/Extender: Rowan Blase in Treatment: 0 Foot Assessment Items Site Locations + = Sensation present, - = Sensation absent, C = Callus, U = Ulcer R = Redness, W = Warmth, M = Maceration, PU = Pre-ulcerative lesion F = Fissure, S = Swelling, D = Dryness Assessment Right: Left: Other Deformity: No No Prior Foot Ulcer: No No Prior Amputation: No No Charcot Joint: No No Ambulatory Status: Ambulatory Without Help Gait: Steady Electronic Signature(s) Signed:  11/03/2021 8:26:07 AM By: Yevonne Pax RN Entered By: Yevonne Pax on 10/31/2021 09:10:41 Robert Brennan, Robert Brennan ([id number]) -------------------------------------------------------------------------------- Nutrition Risk Screening Details Patient Name: Robert Brennan, Robert Brennan Date of Service: 10/31/2021 8:45 AM Medical Record Number: [id number] Patient Account Number: [id number] Date of Birth/Sex: 1971-05-04 (51 y.o. M) Treating RN: Yevonne Pax Primary Care Emrie Gayle: Vance Peper Other Clinician: Referring Tianni Escamilla: Rosetta Posner Treating Paddy Walthall/Extender: Rowan Blase in Treatment: 0 Height (in): 72 Weight (lbs): 273 Body Mass Index (BMI): 37 Nutrition Risk Screening Items Score Screening NUTRITION RISK SCREEN: I have an illness or condition that made me change the kind and/or amount of food I eat 0 No I eat fewer than two meals per day 0 No I eat few fruits and vegetables, or milk products 0 No I have three or more drinks of beer, liquor or wine almost every day 0 No I have tooth or mouth problems that make it hard for me to eat 0 No I don't always have enough money to buy the food I need 0 No I eat alone most of the time 0 No I take three or more different prescribed or over-the-counter drugs a day 1 Yes Without wanting to, I have lost or gained 10 pounds in the last six months 0 No I am not always physically able to shop, cook and/or feed myself 0 No Nutrition Protocols Good Risk Protocol 0 No interventions needed Moderate Risk Protocol High Risk Proctocol Risk Level: Good Risk Score: 1 Electronic Signature(s) Signed: 11/03/2021 8:26:07 AM By: Yevonne Pax RN Entered By: Yevonne Pax on 10/31/2021 08:59:47

## 2021-11-06 LAB — AEROBIC/ANAEROBIC CULTURE W GRAM STAIN (SURGICAL/DEEP WOUND)

## 2021-11-06 NOTE — Anesthesia Postprocedure Evaluation (Signed)
Anesthesia Post Note  Patient: Sylvan Sookdeo  Procedure(s) Performed: DEBRIDEMENT WOUND (Right: Leg Lower)  Patient location during evaluation: PACU Anesthesia Type: General Level of consciousness: awake and alert Pain management: pain level controlled Vital Signs Assessment: post-procedure vital signs reviewed and stable Respiratory status: spontaneous breathing, nonlabored ventilation, respiratory function stable and patient connected to nasal cannula oxygen Cardiovascular status: blood pressure returned to baseline and stable Postop Assessment: no apparent nausea or vomiting Anesthetic complications: no   No notable events documented.   Last Vitals:  Vitals:   11/02/21 1330 11/02/21 1340  BP: 106/68 (!) 129/93  Pulse: 96 96  Resp: 11 14  Temp: (!) 36.1 C (!) 36.1 C  SpO2: 99% 99%    Last Pain:  Vitals:   11/03/21 1029  TempSrc:   PainSc: 2                  Yevette Edwards

## 2021-11-15 ENCOUNTER — Ambulatory Visit: Payer: BLUE CROSS/BLUE SHIELD | Admitting: Physician Assistant

## 2021-11-22 ENCOUNTER — Ambulatory Visit: Payer: BLUE CROSS/BLUE SHIELD | Admitting: Physician Assistant

## 2021-11-29 ENCOUNTER — Ambulatory Visit: Payer: BLUE CROSS/BLUE SHIELD | Admitting: Physician Assistant

## 2021-11-30 ENCOUNTER — Ambulatory Visit: Payer: Self-pay | Admitting: Surgery

## 2021-11-30 NOTE — H&P (Signed)
Subjective: ?  ?CC: Venous stasis ulcer of right calf with muscle involvement without evidence of necrosis without varicose veins (CMS-HCC) [I87.2, L97.215] POSTOP ?  ?HPI: ? VALOR QUAINTANCE is a 51 y.o. male who is here for followup from above.  No acute issues ?  ?Current Medications: has a current medication list which includes the following prescription(s): allopurinol, amlodipine, atorvastatin, benazepril, contour test strips, blood glucose diagnostic, blood-glucose meter, blood-glucose meter, cialis, fluticasone propionate, ibuprofen, insulin degludec, onetouch delica lancets, lancets, meloxicam, metformin, metoprolol succinate, potassium chloride, promethazine-dextromethorphan, ozempic, spironolactone, and [DISCONTINUED] empagliflozin-metformin. ?  ?Allergies:  ?Allergies ?Allergen Reactions ? Thiazides Other (See Comments) ?    Low K ?  ?  ?ROS: ?General: Denies weight loss, weight gain, fatigue, fevers, chills, and night sweats. ?Heart: Denies chest pain, palpitations, racing heart, irregular heartbeat, leg pain or swelling, and decreased activity tolerance. ?Respiratory: Denies breathing difficulty, shortness of breath, wheezing, cough, and sputum. ?GI: Denies change in appetite, heartburn, nausea, vomiting, constipation, diarrhea, and blood in stool. ?GU: Denies difficulty urinating, pain with urinating, urgency, frequency, blood in urine ?  ?  ?Objective: ?  ?BP (!) 147/93   Pulse 99   Ht 182.9 cm (6')   Wt (!) 117.9 kg (260 lb)   BMI 35.26 kg/m?  ?  ?Constitutional :  Alert, no distress, cooperative ?Gastrointestinal: soft, non-tender; bowel sounds normal; no masses,  no organomegaly.  ?Musculoskeletal: Steady gait and movement ?Skin: Cool and moist, wound clean, dry, intact. 6.5cm x 5cm wide.  No erythema, induration or drainage to indicate infection.   ?Psychiatric: Normal affect, non-agitated, not confused ?    ?  ?LABS:  ?N/A  ?  ?RADS: ?N/A ?  ?Assessment: ?  ?   ?Venous stasis ulcer of right calf  with muscle involvement without evidence of necrosis without varicose veins (CMS-HCC) [I87.2, L97.215] ?S/p debridement in OR ?  ?Plan: ?  ?1.Wound vac placed by myself personally.  Black sponge cut to size to cover wound, dressing applied over it to maintain 141m Hg seal after connecting to DME wound vac.  Pt tolerated procedure well. F/u couple days for next wound vac change. No change in size the past month, but wound bed continues to look very healthy. ?  ?Pt now interested in possible myriad matrix placement in order to discontinue the wound vac. R/b/a discussed and will like to proceed. ?  ?Still has vascular appt scheduled for 12/01/21.  DM labs ordered per PCP request. ?  ?FMLA completed starting from day of surgery through day of graft placement.  Ok to return to work after graft placed. ?  ?labs/images/medications/previous chart entries reviewed personally and relevant changes/updates noted above. ? ?

## 2021-11-30 NOTE — H&P (View-Only) (Signed)
Subjective: ?  ?CC: Venous stasis ulcer of right calf with muscle involvement without evidence of necrosis without varicose veins (CMS-HCC) [I87.2, L97.215] POSTOP ?  ?HPI: ? Robert Brennan is a 50 y.o. male who is here for followup from above.  No acute issues ?  ?Current Medications: has a current medication list which includes the following prescription(s): allopurinol, amlodipine, atorvastatin, benazepril, contour test strips, blood glucose diagnostic, blood-glucose meter, blood-glucose meter, cialis, fluticasone propionate, ibuprofen, insulin degludec, onetouch delica lancets, lancets, meloxicam, metformin, metoprolol succinate, potassium chloride, promethazine-dextromethorphan, ozempic, spironolactone, and [DISCONTINUED] empagliflozin-metformin. ?  ?Allergies:  ?Allergies ?Allergen Reactions ? Thiazides Other (See Comments) ?    Low K ?  ?  ?ROS: ?General: Denies weight loss, weight gain, fatigue, fevers, chills, and night sweats. ?Heart: Denies chest pain, palpitations, racing heart, irregular heartbeat, leg pain or swelling, and decreased activity tolerance. ?Respiratory: Denies breathing difficulty, shortness of breath, wheezing, cough, and sputum. ?GI: Denies change in appetite, heartburn, nausea, vomiting, constipation, diarrhea, and blood in stool. ?GU: Denies difficulty urinating, pain with urinating, urgency, frequency, blood in urine ?  ?  ?Objective: ?  ?BP (!) 147/93   Pulse 99   Ht 182.9 cm (6')   Wt (!) 117.9 kg (260 lb)   BMI 35.26 kg/m?  ?  ?Constitutional :  Alert, no distress, cooperative ?Gastrointestinal: soft, non-tender; bowel sounds normal; no masses,  no organomegaly.  ?Musculoskeletal: Steady gait and movement ?Skin: Cool and moist, wound clean, dry, intact. 6.5cm x 5cm wide.  No erythema, induration or drainage to indicate infection.   ?Psychiatric: Normal affect, non-agitated, not confused ?    ?  ?LABS:  ?N/A  ?  ?RADS: ?N/A ?  ?Assessment: ?  ?   ?Venous stasis ulcer of right calf  with muscle involvement without evidence of necrosis without varicose veins (CMS-HCC) [I87.2, L97.215] ?S/p debridement in OR ?  ?Plan: ?  ?1.Wound vac placed by myself personally.  Black sponge cut to size to cover wound, dressing applied over it to maintain 125mm Hg seal after connecting to DME wound vac.  Pt tolerated procedure well. F/u couple days for next wound vac change. No change in size the past month, but wound bed continues to look very healthy. ?  ?Pt now interested in possible myriad matrix placement in order to discontinue the wound vac. R/b/a discussed and will like to proceed. ?  ?Still has vascular appt scheduled for 12/01/21.  DM labs ordered per PCP request. ?  ?FMLA completed starting from day of surgery through day of graft placement.  Ok to return to work after graft placed. ?  ?labs/images/medications/previous chart entries reviewed personally and relevant changes/updates noted above. ? ?

## 2021-12-01 ENCOUNTER — Other Ambulatory Visit: Payer: Self-pay

## 2021-12-01 ENCOUNTER — Encounter (INDEPENDENT_AMBULATORY_CARE_PROVIDER_SITE_OTHER): Payer: Self-pay | Admitting: Vascular Surgery

## 2021-12-01 ENCOUNTER — Ambulatory Visit (INDEPENDENT_AMBULATORY_CARE_PROVIDER_SITE_OTHER): Payer: BC Managed Care – PPO | Admitting: Vascular Surgery

## 2021-12-01 VITALS — BP 137/90 | HR 95 | Resp 16 | Ht 72.0 in | Wt 270.8 lb

## 2021-12-01 DIAGNOSIS — Z794 Long term (current) use of insulin: Secondary | ICD-10-CM

## 2021-12-01 DIAGNOSIS — E782 Mixed hyperlipidemia: Secondary | ICD-10-CM

## 2021-12-01 DIAGNOSIS — I890 Lymphedema, not elsewhere classified: Secondary | ICD-10-CM | POA: Diagnosis not present

## 2021-12-01 DIAGNOSIS — I10 Essential (primary) hypertension: Secondary | ICD-10-CM

## 2021-12-01 DIAGNOSIS — E119 Type 2 diabetes mellitus without complications: Secondary | ICD-10-CM | POA: Diagnosis not present

## 2021-12-01 DIAGNOSIS — I872 Venous insufficiency (chronic) (peripheral): Secondary | ICD-10-CM

## 2021-12-02 DIAGNOSIS — I10 Essential (primary) hypertension: Secondary | ICD-10-CM | POA: Insufficient documentation

## 2021-12-02 DIAGNOSIS — E785 Hyperlipidemia, unspecified: Secondary | ICD-10-CM | POA: Insufficient documentation

## 2021-12-02 DIAGNOSIS — I872 Venous insufficiency (chronic) (peripheral): Secondary | ICD-10-CM | POA: Insufficient documentation

## 2021-12-02 DIAGNOSIS — I890 Lymphedema, not elsewhere classified: Secondary | ICD-10-CM | POA: Insufficient documentation

## 2021-12-02 NOTE — Progress Notes (Signed)
MRN : [id number]  Robert Brennan is a 51 y.o. (August 19, 1971) male who presents with chief complaint of leg pain and swelling.  History of Present Illness:  Patient is seen for evaluation of leg pain and leg swelling. The patient first noticed the swelling remotely. The swelling is associated with pain and discoloration. The pain and swelling worsens with prolonged dependency and improves with elevation. The pain is unrelated to activity.  The patient notes that in the morning the legs are significantly improved but they steadily worsened throughout the course of the day. The patient also notes a steady worsening of the discoloration in the ankle and shin area.   The patient denies claudication symptoms.  The patient denies symptoms consistent with rest pain.  The patient denies and extensive history of DJD and LS spine disease.  The patient has no had any past angiography, interventions or vascular surgery.  Elevation makes the leg symptoms better, dependency makes them much worse. There is no history of ulcerations. The patient denies any recent changes in medications.  The patient has not been wearing graduated compression.  The patient denies a history of DVT or PE. There is no prior history of phlebitis. There is no history of primary lymphedema.  No history of malignancies. No history of trauma or groin or pelvic surgery. There is no history of radiation treatment to the groin or pelvis  The patient denies amaurosis fugax or recent TIA symptoms. There are no recent neurological changes noted. The patient denies recent episodes of angina or shortness of breath   Current Meds  Medication Sig   acetaminophen (TYLENOL) 500 MG tablet Take 500-1,000 mg by mouth every 6 (six) hours as needed (for pain.).   allopurinol (ZYLOPRIM) 100 MG tablet Take 200 mg by mouth in the morning.   amLODipine (NORVASC) 10 MG tablet Take 10 mg by mouth in the morning.   atorvastatin (LIPITOR) 10 MG tablet Take  10 mg by mouth at bedtime.   benazepril (LOTENSIN) 40 MG tablet Take 40 mg by mouth at bedtime.   fluticasone (FLONASE) 50 MCG/ACT nasal spray Place 2 sprays into both nostrils daily.   ibuprofen (ADVIL) 800 MG tablet Take 1 tablet (800 mg total) by mouth every 8 (eight) hours as needed for mild pain or moderate pain.   meloxicam (MOBIC) 7.5 MG tablet Take 7.5 mg by mouth daily.   metFORMIN (GLUCOPHAGE) 1000 MG tablet Take 1,000 mg by mouth 2 (two) times daily.   metoprolol succinate (TOPROL-XL) 100 MG 24 hr tablet Take 100 mg by mouth at bedtime. Take with or immediately following a meal.   OZEMPIC, 1 MG/DOSE, 4 MG/3ML SOPN Inject 1 mg into the skin every Sunday.   potassium chloride (KLOR-CON) 10 MEQ tablet Take 20 mEq by mouth in the morning.   promethazine-dextromethorphan (PROMETHAZINE-DM) 6.25-15 MG/5ML syrup Take by mouth 4 (four) times daily as needed for cough.   spironolactone (ALDACTONE) 25 MG tablet Take 25 mg by mouth 2 (two) times daily.   tadalafil (CIALIS) 5 MG tablet Take 5 mg by mouth daily as needed for erectile dysfunction.   TRESIBA FLEXTOUCH 200 UNIT/ML FlexTouch Pen Inject 54 Units into the skin in the morning and at bedtime.    Past Medical History:  Diagnosis Date   Azoospermia    Gout    per pt stable as of 03-24-2016   Hypertension    Hypogonadism, testicular    Type 2 diabetes mellitus ([place])    Wears glasses  Past Surgical History:  Procedure Laterality Date   BREAST REDUCTION SURGERY  age 62 and 25   COLONOSCOPY  2011   TESTICLE BIOPSY N/A 04/04/2016   Procedure: BIOPSY TESTICULAR;  Surgeon: Festus Aloe, MD;  [facility];  Service: Urology;  Laterality: N/A;   WOUND DEBRIDEMENT Right 11/02/2021   Procedure: DEBRIDEMENT WOUND;  Surgeon: Benjamine Sprague, DO;  Location: [facility];  Service: General;  Laterality: Right;    Social History Social History   Tobacco Use   Smoking status: Never   Smokeless tobacco: Never   Substance Use Topics   Alcohol use: No   Drug use: No    Family History Family History  Problem Relation Age of Onset   Arthritis Mother    Hypertension Mother    Cancer Father     Allergies  Allergen Reactions   Thiazide-Type Diuretics Other (See Comments)    Low K     REVIEW OF SYSTEMS (Negative unless checked)  Constitutional: [] Weight loss  [] Fever  [] Chills Cardiac: [] Chest pain   [] Chest pressure   [] Palpitations   [] Shortness of breath when laying flat   [] Shortness of breath with exertion. Vascular:  [] Pain in legs with walking   [x] Pain in legs at rest  [] History of DVT   [] Phlebitis   [x] Swelling in legs   [] Varicose veins   [] Non-healing ulcers Pulmonary:   [] Uses home oxygen   [] Productive cough   [] Hemoptysis   [] Wheeze  [] COPD   [] Asthma Neurologic:  [] Dizziness   [] Seizures   [] History of stroke   [] History of TIA  [] Aphasia   [] Vissual changes   [] Weakness or numbness in arm   [] Weakness or numbness in leg Musculoskeletal:   [] Joint swelling   [x] Joint pain   [] Low back pain Hematologic:  [] Easy bruising  [] Easy bleeding   [] Hypercoagulable state   [] Anemic Gastrointestinal:  [] Diarrhea   [] Vomiting  [] Gastroesophageal reflux/heartburn   [] Difficulty swallowing. Genitourinary:  [] Chronic kidney disease   [] Difficult urination  [] Frequent urination   [] Blood in urine Skin:  [] Rashes   [] Ulcers  Psychological:  [] History of anxiety   []  History of major depression.  Physical Examination  Vitals:   12/01/21 1433  BP: 137/90  Pulse: 95  Resp: 16  Weight: 270 lb 12.8 oz (122.8 kg)  Height: 6' (1.829 m)   Body mass index is 36.73 kg/m. Gen: WD/WN, NAD Head: [place]/AT, No temporalis wasting.  Ear/Nose/Throat: Hearing grossly intact, nares w/o erythema or drainage, pinna without lesions Eyes: PER, EOMI, sclera nonicteric.  Neck: Supple, no gross masses.  No JVD.  Pulmonary:  Good air movement, no audible wheezing, no use of accessory muscles.  Cardiac: RRR,  precordium not hyperdynamic. Vascular:  scattered varicosities present bilaterally.  Moderate venous stasis changes to the legs bilaterally.  3+ soft pitting edema  Vessel Right Left  Radial Palpable Palpable  Gastrointestinal: soft, non-distended. No guarding/no peritoneal signs.  Musculoskeletal: M/S 5/5 throughout.  No deformity.  Neurologic: CN 2-12 intact. Pain and light touch intact in extremities.  Symmetrical.  Speech is fluent. Motor exam as listed above. Psychiatric: Judgment intact, Mood & affect appropriate for pt's clinical situation. Dermatologic: Moderate venous rashes no ulcers noted.  No changes consistent with cellulitis. Lymph : No lichenification or skin changes of chronic lymphedema.  CBC Lab Results  Component Value Date   WBC 6.3 08/15/2015   HGB 14.3 04/04/2016   HCT 42.0 04/04/2016   MCV 85.9 08/15/2015   PLT 191 08/15/2015  BMET    Component Value Date/Time   NA 138 04/04/2016 0804   K 3.4 (L) 04/04/2016 0804   CL 96 (L) 04/04/2016 0804   CO2 25 08/15/2015 1333   GLUCOSE 302 (H) 04/04/2016 0804   BUN 17 04/04/2016 0804   CREATININE 1.00 04/04/2016 0804   CREATININE 1.08 06/25/2013 1111   CALCIUM 9.3 08/15/2015 1333   GFRNONAA >60 08/15/2015 1333   GFRAA >60 08/15/2015 1333   CrCl cannot be calculated (Patient's most recent lab result is older than the maximum 21 days allowed.).  COAG No results found for: INR, PROTIME  Radiology No results found.   Assessment/Plan 1. Chronic venous insufficiency Recommend:  No surgery or intervention at this point in time.    I have reviewed my previous discussion with the patient regarding swelling and why it causes symptoms.  Patient will continue wearing graduated compression stockings class 1 (20-30 mmHg) on a daily basis. The patient will  beginning wearing the stockings first thing in the morning and removing them in the evening. The patient is instructed specifically not to sleep in the stockings.     In addition, behavioral modification including several periods of elevation of the lower extremities during the day will be continued.  This was reviewed with the patient during the initial visit.  The patient will also continue routine exercise, especially walking on a daily basis as was discussed during the initial visit.    Despite conservative treatments including graduated compression therapy class 1 and behavioral modification including exercise and elevation the patient  has not obtained adequate control of the lymphedema.  The patient still has stage 3 lymphedema and therefore, I believe that a lymph pump should be added to improve the control of the patient's lymphedema.  Additionally, a lymph pump is warranted because it will reduce the risk of cellulitis and ulceration in the future.  Patient should follow-up in 1 month     A total of 45 minutes was spent with this patient and greater than 50% was spent in counseling and coordination of care with the patient.  Discussion included the treatment options for vascular disease including indications for surgery and intervention.  Also discussed is the appropriate timing of treatment.  In addition medical therapy was discussed.   - VAS [place] LOWER EXTREMITY VENOUS REFLUX; Future  2. Lymphedema Recommend:  No surgery or intervention at this point in time.    I have reviewed my previous discussion with the patient regarding swelling and why it causes symptoms.  Patient will continue wearing graduated compression stockings class 1 (20-30 mmHg) on a daily basis. The patient will  beginning wearing the stockings first thing in the morning and removing them in the evening. The patient is instructed specifically not to sleep in the stockings.    In addition, behavioral modification including several periods of elevation of the lower extremities during the day will be continued.  This was reviewed with the patient during the initial visit.  The  patient will also continue routine exercise, especially walking on a daily basis as was discussed during the initial visit.    Despite conservative treatments including graduated compression therapy class 1 and behavioral modification including exercise and elevation the patient  has not obtained adequate control of the lymphedema.  The patient still has stage 3 lymphedema and therefore, I believe that a lymph pump should be added to improve the control of the patient's lymphedema.  Additionally, a lymph pump is warranted because it  will reduce the risk of cellulitis and ulceration in the future.  Patient should follow-up in 60month   A total of 45 minutes was spent with this patient and greater than 50% was spent in counseling and coordination of care with the patient.  Discussion included the treatment options for vascular disease including indications for surgery and intervention.  Also discussed is the appropriate timing of treatment.  In addition medical therapy was discussed.   - VAS [place] LOWER EXTREMITY VENOUS REFLUX; Future  3. Type 2 diabetes mellitus treated with insulin ([place]) Continue hypoglycemic medications as already ordered, these medications have been reviewed and there are no changes at this time.  Hgb A1C to be monitored as already arranged by primary service   4. Essential hypertension Continue antihypertensive medications as already ordered, these medications have been reviewed and there are no changes at this time.   5. Mixed hyperlipidemia Continue statin as ordered and reviewed, no changes at this time     Hortencia Pilar, MD  12/02/2021 9:44 AM

## 2021-12-06 ENCOUNTER — Ambulatory Visit: Payer: BLUE CROSS/BLUE SHIELD | Admitting: Physician Assistant

## 2021-12-13 ENCOUNTER — Other Ambulatory Visit: Payer: Self-pay

## 2021-12-13 ENCOUNTER — Encounter
Admission: RE | Admit: 2021-12-13 | Discharge: 2021-12-13 | Disposition: A | Discharge status: Home / Self Care | Payer: BC Managed Care – PPO | Source: Ambulatory Visit | Attending: Surgery | Admitting: Surgery

## 2021-12-13 ENCOUNTER — Ambulatory Visit: Payer: BLUE CROSS/BLUE SHIELD | Admitting: Physician Assistant

## 2021-12-13 VITALS — Ht 72.0 in | Wt 265.0 lb

## 2021-12-13 DIAGNOSIS — E119 Type 2 diabetes mellitus without complications: Secondary | ICD-10-CM

## 2021-12-13 DIAGNOSIS — I10 Essential (primary) hypertension: Secondary | ICD-10-CM | POA: Diagnosis not present

## 2021-12-13 DIAGNOSIS — Z79899 Other long term (current) drug therapy: Secondary | ICD-10-CM | POA: Diagnosis not present

## 2021-12-13 DIAGNOSIS — Z01812 Encounter for preprocedural laboratory examination: Secondary | ICD-10-CM | POA: Insufficient documentation

## 2021-12-13 DIAGNOSIS — Z794 Long term (current) use of insulin: Secondary | ICD-10-CM | POA: Insufficient documentation

## 2021-12-13 DIAGNOSIS — E876 Hypokalemia: Secondary | ICD-10-CM

## 2021-12-13 DIAGNOSIS — I83012 Varicose veins of right lower extremity with ulcer of calf: Secondary | ICD-10-CM | POA: Diagnosis not present

## 2021-12-13 DIAGNOSIS — Z7984 Long term (current) use of oral hypoglycemic drugs: Secondary | ICD-10-CM | POA: Insufficient documentation

## 2021-12-13 DIAGNOSIS — I878 Other specified disorders of veins: Secondary | ICD-10-CM | POA: Diagnosis present

## 2021-12-13 DIAGNOSIS — E1065 Type 1 diabetes mellitus with hyperglycemia: Secondary | ICD-10-CM | POA: Diagnosis not present

## 2021-12-13 LAB — BASIC METABOLIC PANEL
Anion gap: 6 (ref 5–15)
BUN: 14 mg/dL (ref 6–20)
CO2: 28 mmol/L (ref 22–32)
Calcium: 9.3 mg/dL (ref 8.9–10.3)
Chloride: 103 mmol/L (ref 98–111)
Creatinine, Ser: 1.16 mg/dL (ref 0.61–1.24)
GFR, Estimated: >60 mL/min (ref >60)
Glucose, Bld: 127 mg/dL — ABNORMAL HIGH (ref 70–99)
Potassium: 4.2 mmol/L (ref 3.5–5.1)
Sodium: 137 mmol/L (ref 135–145)

## 2021-12-13 NOTE — Progress Notes (Signed)
?  Perioperative Services: Pre-Admission/Anesthesia Testing ? ?Abnormal Lab Notification ?  ? ?Date: 12/13/21 ? ?Name: Robert Brennan ?MRN:   [id number] ? ?Re: Abnormal labs noted during PAT appointment  ? ?Provider(s) Notified: Sung Amabile, DO ?Notification mode: Routed and/or faxed via [facility] ?  ?ABNORMAL LAB VALUE(S): ?Lab Results  ?Component Value Date  ? GLUCOSE 127 (H) 12/13/2021  ?  ?Notes: Patient with a T2DM diagnosis. He is currently on both oral (metformin) and parenteral (semaglutide + insulin degludec) therapies. Last Hgb A1c was 11.8% on 07/29/2021. Efforts made to repeat lab study today, however there was sample hemolysis that invalidated the obtained specimen. In efforts to reduce his risk of developing SSI, or other potential perioperative complications, this communication is being sent in order to determine if patient is deemed to have adequate medical optimization, including preoperative glycemic control.  ? ?With that being said, the benefit of improving glycemic control must be weighed against the overall risk associated with delaying a necessary procedure for this patient. He has already undergone wound debridement back in 10/2021. He is going in now for graft application due to venous stasis ulceration of the calf with muscle involvement. Having not seen the patient personally, I am not sure we are at a point where this procedure can be postponed pending better optimization of his pre-procedural glucose levels. I just wanted to make sure that primary attending surgeon was aware of A1c in efforts to ensure diligent postoperative follow up for assessment of SSI.  ? ?This is a Personal assistant; no formal response is required. ? ?Quentin Mulling, MSN, APRN, FNP-C, CEN ?[facility]  ?Peri-operative Services Nurse Practitioner ?Phone: [phone] ?12/13/21 4:19 PM ? ?

## 2021-12-13 NOTE — Patient Instructions (Addendum)
Your procedure is scheduled on: Thursday 12/15/21 ?Report to the Registration Desk on the 1st floor of the [facility]. ?To find out your arrival time, please call [phone] between 1PM - 3PM on: Wednesday 12/14/21 ? ?REMEMBER: ?Instructions that are not followed completely may result in serious medical risk, up to and including death; or upon the discretion of your surgeon and anesthesiologist your surgery may need to be rescheduled. ? ?Do not eat food after midnight the night before surgery.  ?No gum chewing, lozengers or hard candies. ? ?You may however, drink water up to 2 hours before you are scheduled to arrive for your surgery. Do not drink anything within 2 hours of your scheduled arrival time. ? ?Type 1 and Type 2 diabetics should only drink water. ? ?TAKE THESE MEDICATIONS THE MORNING OF SURGERY WITH A SIP OF WATER: ?amLODipine (NORVASC) 10 MG tablet ? ? ?One week prior to surgery: ?Stop Anti-inflammatories (NSAIDS) such as Advil, Aleve, Ibuprofen 800 mg, meloxicam (MOBIC) 7.5 MG tablet, Motrin, Naproxen, Naprosyn and Aspirin based products such as Excedrin, Goodys Powder, BC Powder. ? ?Stop ANY OVER THE COUNTER supplements until after surgery. ? ?You may however, continue to take Tylenol if needed for pain up until the day of surgery. ? ?No Alcohol for 24 hours before or after surgery. ? ?No Smoking including e-cigarettes for 24 hours prior to surgery.  ?No chewable tobacco products for at least 6 hours prior to surgery.  ?No nicotine patches on the day of surgery. ? ?Do not use any "recreational" drugs for at least a week prior to your surgery.  ?Please be advised that the combination of cocaine and anesthesia may have negative outcomes, up to and including death. ?If you test positive for cocaine, your surgery will be cancelled. ? ?On the morning of surgery brush your teeth with toothpaste and water, you may rinse your mouth with mouthwash if you wish. ?Do not swallow any toothpaste or  mouthwash. ? ?Use CHG wipes as directed on instruction sheet. ? ?Do not wear jewelry. ? ?Do not wear lotions, powders, or colognes.  ? ?Do not shave body from the neck down 48 hours prior to surgery just in case you cut yourself which could leave a site for infection.  ?Also, freshly shaved skin may become irritated if using the CHG soap. ? ?Do not bring valuables to the hospital. [facility] is not responsible for any missing/lost belongings or valuables.  ? ?Notify your doctor if there is any change in your medical condition (cold, fever, infection). ? ?Wear comfortable clothing (specific to your surgery type) to the hospital. ? ?If you are being discharged the day of surgery, you will not be allowed to drive home. ?You will need a responsible adult (18 years or older) to drive you home and stay with you that night.  ? ?If you are taking public transportation, you will need to have a responsible adult (18 years or older) with you. ?Please confirm with your physician that it is acceptable to use public transportation.  ? ?Please call the Pre-admissions Testing Dept. at [phone] if you have any questions about these instructions. ? ?Surgery Visitation Policy: ? ?Patients undergoing a surgery or procedure may have two family members or support persons with them as long as the person is not COVID-19 positive or experiencing its symptoms.  ? ?Inpatient Visitation:   ? ?Visiting hours are 7 a.m. to 8 p.m. ?Up to four visitors are allowed at one time in a patient  room, including children. The visitors may rotate out with other people during the day. One designated support person (adult) may remain overnight. ? ?All Areas: ?All visitors must pass COVID-19 screenings, use hand sanitizer when entering and exiting the patient?s room and wear a mask at all times, including in the patient?s room. ?Patients must also wear a mask when staff or their visitor are in the room. ?Masking is required regardless of vaccination  status.  ?

## 2021-12-15 ENCOUNTER — Encounter: Payer: Self-pay | Admitting: Surgery

## 2021-12-15 ENCOUNTER — Ambulatory Visit: Payer: BC Managed Care – PPO | Admitting: Urgent Care

## 2021-12-15 ENCOUNTER — Other Ambulatory Visit: Payer: Self-pay

## 2021-12-15 ENCOUNTER — Encounter
Admission: RE | Disposition: A | Discharge status: Home / Self Care | Payer: Self-pay | Source: Home / Self Care | Attending: Surgery

## 2021-12-15 ENCOUNTER — Ambulatory Visit
Admission: RE | Admit: 2021-12-15 | Discharge: 2021-12-15 | Disposition: A | Discharge status: Home / Self Care | Payer: BC Managed Care – PPO | Attending: Surgery | Admitting: Surgery

## 2021-12-15 ENCOUNTER — Ambulatory Visit: Payer: BC Managed Care – PPO | Admitting: Certified Registered"

## 2021-12-15 DIAGNOSIS — E119 Type 2 diabetes mellitus without complications: Secondary | ICD-10-CM

## 2021-12-15 DIAGNOSIS — Z794 Long term (current) use of insulin: Secondary | ICD-10-CM | POA: Insufficient documentation

## 2021-12-15 DIAGNOSIS — I83012 Varicose veins of right lower extremity with ulcer of calf: Secondary | ICD-10-CM | POA: Diagnosis not present

## 2021-12-15 DIAGNOSIS — Z79899 Other long term (current) drug therapy: Secondary | ICD-10-CM | POA: Insufficient documentation

## 2021-12-15 DIAGNOSIS — I10 Essential (primary) hypertension: Secondary | ICD-10-CM | POA: Insufficient documentation

## 2021-12-15 DIAGNOSIS — Z7984 Long term (current) use of oral hypoglycemic drugs: Secondary | ICD-10-CM | POA: Insufficient documentation

## 2021-12-15 DIAGNOSIS — E876 Hypokalemia: Secondary | ICD-10-CM

## 2021-12-15 DIAGNOSIS — E1065 Type 1 diabetes mellitus with hyperglycemia: Secondary | ICD-10-CM | POA: Insufficient documentation

## 2021-12-15 HISTORY — PX: GRAFT APPLICATION: SHX6696

## 2021-12-15 LAB — GLUCOSE, CAPILLARY
Glucose-Capillary: 97 mg/dL (ref 70–99)
Glucose-Capillary: 93 mg/dL (ref 70–99)

## 2021-12-15 SURGERY — GRAFT APPLICATION
Anesthesia: General | Site: Leg Lower | Laterality: Right

## 2021-12-15 MED ORDER — BUPIVACAINE-EPINEPHRINE 0.5% -1:200000 IJ SOLN
INTRAMUSCULAR | Status: DC | PRN
  Administered 2021-12-15: 11 mL

## 2021-12-15 MED ORDER — PROPOFOL 10 MG/ML IV BOLUS
INTRAVENOUS | Status: AC
  Filled 2021-12-15: qty 20

## 2021-12-15 MED ORDER — PHENYLEPHRINE 40 MCG/ML (10ML) SYRINGE FOR IV PUSH (FOR BLOOD PRESSURE SUPPORT)
PREFILLED_SYRINGE | INTRAVENOUS | Status: DC | PRN
  Administered 2021-12-15 (×3): 80 ug via INTRAVENOUS

## 2021-12-15 MED ORDER — BUPIVACAINE-EPINEPHRINE (PF) 0.5% -1:200000 IJ SOLN
INTRAMUSCULAR | Status: AC
  Filled 2021-12-15: qty 30

## 2021-12-15 MED ORDER — FENTANYL CITRATE (PF) 100 MCG/2ML IJ SOLN
INTRAMUSCULAR | Status: DC | PRN
  Administered 2021-12-15: 50 ug via INTRAVENOUS

## 2021-12-15 MED ORDER — LIDOCAINE HCL (PF) 1 % IJ SOLN
INTRAMUSCULAR | Status: AC
  Filled 2021-12-15: qty 30

## 2021-12-15 MED ORDER — ONDANSETRON HCL 4 MG/2ML IJ SOLN: 4.0000 mg | Freq: Once | INTRAMUSCULAR | Status: DC | PRN

## 2021-12-15 MED ORDER — TRAMADOL HCL 50 MG PO TABS
50.0000 mg | ORAL_TABLET | Freq: Once | ORAL | Status: AC
  Administered 2021-12-15: 50 mg via ORAL

## 2021-12-15 MED ORDER — CEFAZOLIN SODIUM-DEXTROSE 2-4 GM/100ML-% IV SOLN
INTRAVENOUS | Status: AC
  Filled 2021-12-15: qty 100

## 2021-12-15 MED ORDER — PROPOFOL 10 MG/ML IV BOLUS
INTRAVENOUS | Status: DC | PRN
  Administered 2021-12-15: 50 mg via INTRAVENOUS
  Administered 2021-12-15: 200 mg via INTRAVENOUS

## 2021-12-15 MED ORDER — LIDOCAINE HCL (PF) 2 % IJ SOLN
INTRAMUSCULAR | Status: AC
Start: 2021-12-15 — End: ?
  Filled 2021-12-15: qty 5

## 2021-12-15 MED ORDER — ONDANSETRON HCL 4 MG/2ML IJ SOLN
INTRAMUSCULAR | Status: AC
Start: 2021-12-15 — End: ?
  Filled 2021-12-15: qty 2

## 2021-12-15 MED ORDER — FENTANYL CITRATE (PF) 100 MCG/2ML IJ SOLN: 25.0000 ug | INTRAMUSCULAR | Status: DC | PRN

## 2021-12-15 MED ORDER — CHLORHEXIDINE GLUCONATE 0.12 % MT SOLN: 15.0000 mL | Freq: Once | OROMUCOSAL | Status: AC

## 2021-12-15 MED ORDER — FENTANYL CITRATE (PF) 100 MCG/2ML IJ SOLN
INTRAMUSCULAR | Status: AC
  Filled 2021-12-15: qty 2

## 2021-12-15 MED ORDER — CHLORHEXIDINE GLUCONATE 0.12 % MT SOLN
OROMUCOSAL | Status: AC
  Administered 2021-12-15: 15 mL via OROMUCOSAL
  Filled 2021-12-15: qty 15

## 2021-12-15 MED ORDER — MIDAZOLAM HCL 2 MG/2ML IJ SOLN
INTRAMUSCULAR | Status: DC | PRN
  Administered 2021-12-15: 2 mg via INTRAVENOUS

## 2021-12-15 MED ORDER — ORAL CARE MOUTH RINSE: 15.0000 mL | Freq: Once | OROMUCOSAL | Status: AC

## 2021-12-15 MED ORDER — TRAMADOL HCL 50 MG PO TABS: 50.0000 mg | ORAL_TABLET | Freq: Three times a day (TID) | ORAL | 0 refills | Status: AC | PRN

## 2021-12-15 MED ORDER — SODIUM CHLORIDE 0.9 % IV SOLN: INTRAVENOUS | Status: DC

## 2021-12-15 MED ORDER — CEFAZOLIN SODIUM-DEXTROSE 2-4 GM/100ML-% IV SOLN
2.0000 g | INTRAVENOUS | Status: AC
  Administered 2021-12-15: 2 g via INTRAVENOUS

## 2021-12-15 MED ORDER — LIDOCAINE HCL (PF) 1 % IJ SOLN
INTRAMUSCULAR | Status: DC | PRN
  Administered 2021-12-15: 11 mL

## 2021-12-15 MED ORDER — ACETAMINOPHEN 10 MG/ML IV SOLN
INTRAVENOUS | Status: AC
  Filled 2021-12-15: qty 100

## 2021-12-15 MED ORDER — LACTATED RINGERS IV SOLN
INTRAVENOUS | Status: DC | PRN
Start: 2021-12-15 — End: 2021-12-15

## 2021-12-15 MED ORDER — PHENYLEPHRINE 40 MCG/ML (10ML) SYRINGE FOR IV PUSH (FOR BLOOD PRESSURE SUPPORT)
PREFILLED_SYRINGE | INTRAVENOUS | Status: AC
  Filled 2021-12-15: qty 10

## 2021-12-15 MED ORDER — DEXMEDETOMIDINE (PRECEDEX) IN NS 20 MCG/5ML (4 MCG/ML) IV SYRINGE
PREFILLED_SYRINGE | INTRAVENOUS | Status: DC | PRN
  Administered 2021-12-15: 8 ug via INTRAVENOUS

## 2021-12-15 MED ORDER — CHLORHEXIDINE GLUCONATE CLOTH 2 % EX PADS: 6.0000 | MEDICATED_PAD | Freq: Once | CUTANEOUS | Status: DC

## 2021-12-15 MED ORDER — ONDANSETRON HCL 4 MG/2ML IJ SOLN
INTRAMUSCULAR | Status: DC | PRN
  Administered 2021-12-15: 4 mg via INTRAVENOUS

## 2021-12-15 MED ORDER — MIDAZOLAM HCL 2 MG/2ML IJ SOLN
INTRAMUSCULAR | Status: AC
  Filled 2021-12-15: qty 2

## 2021-12-15 MED ORDER — TRAMADOL HCL 50 MG PO TABS
ORAL_TABLET | ORAL | Status: AC
  Filled 2021-12-15: qty 1

## 2021-12-15 MED ORDER — LIDOCAINE HCL (CARDIAC) PF 100 MG/5ML IV SOSY
PREFILLED_SYRINGE | INTRAVENOUS | Status: DC | PRN
  Administered 2021-12-15: 80 mg via INTRAVENOUS

## 2021-12-15 MED ORDER — ACETAMINOPHEN 10 MG/ML IV SOLN
INTRAVENOUS | Status: DC | PRN
  Administered 2021-12-15: 1000 mg via INTRAVENOUS

## 2021-12-15 MED ORDER — 0.9 % SODIUM CHLORIDE (POUR BTL) OPTIME
TOPICAL | Status: DC | PRN
  Administered 2021-12-15: 100 mL

## 2021-12-15 SURGICAL SUPPLY — 37 items
ADH SKN CLS APL DERMABOND .7 (GAUZE/BANDAGES/DRESSINGS)
APL PRP STRL LF DISP 70% ISPRP (MISCELLANEOUS)
BLADE SURG 15 STRL LF DISP TIS (BLADE) ×1 IMPLANT
BLADE SURG 15 STRL SS (BLADE) ×2
CHLORAPREP W/TINT 26 (MISCELLANEOUS) ×1 IMPLANT
DERMABOND ADVANCED (GAUZE/BANDAGES/DRESSINGS)
DERMABOND ADVANCED .7 DNX12 (GAUZE/BANDAGES/DRESSINGS) ×1 IMPLANT
DRAPE 3/4 80X56 (DRAPES) ×1 IMPLANT
DRAPE LAPAROTOMY 100X77 ABD (DRAPES) ×2 IMPLANT
DRSG EMULSION OIL 3X8 NADH (GAUZE/BANDAGES/DRESSINGS) ×1 IMPLANT
DRSG MEPILEX FLEX 6X6 (GAUZE/BANDAGES/DRESSINGS) ×1 IMPLANT
ELECT CAUTERY BLADE 6.4 (BLADE) ×2 IMPLANT
ELECT REM PT RETURN 9FT ADLT (ELECTROSURGICAL) ×2
ELECTRODE REM PT RTRN 9FT ADLT (ELECTROSURGICAL) ×1 IMPLANT
GAUZE 4X4 16PLY ~~LOC~~+RFID DBL (SPONGE) ×1 IMPLANT
GLOVE SURG SYN 6.5 ES PF (GLOVE) ×6 IMPLANT
GLOVE SURG SYN 6.5 PF PI (GLOVE) ×1 IMPLANT
GLOVE SURG UNDER POLY LF SZ7 (GLOVE) ×4 IMPLANT
GOWN STRL REUS W/ TWL LRG LVL3 (GOWN DISPOSABLE) ×2 IMPLANT
GOWN STRL REUS W/TWL LRG LVL3 (GOWN DISPOSABLE) ×6
GRAFT MYRIAD 3 LAYER 10X10 (Graft) ×1 IMPLANT
KIT TURNOVER KIT A (KITS) ×2 IMPLANT
LABEL OR SOLS (LABEL) ×2 IMPLANT
MANIFOLD NEPTUNE II (INSTRUMENTS) ×2 IMPLANT
NEEDLE HYPO 22GX1.5 SAFETY (NEEDLE) ×2 IMPLANT
NS IRRIG 1000ML POUR BTL (IV SOLUTION) ×1 IMPLANT
PACK BASIN MINOR ARMC (MISCELLANEOUS) ×2 IMPLANT
SUT ETHILON 3-0 FS-10 30 BLK (SUTURE)
SUT MNCRL 4-0 (SUTURE)
SUT MNCRL 4-0 27XMFL (SUTURE)
SUT VIC AB 3-0 SH 27 (SUTURE)
SUT VIC AB 3-0 SH 27X BRD (SUTURE) ×1 IMPLANT
SUTURE EHLN 3-0 FS-10 30 BLK (SUTURE) IMPLANT
SUTURE MNCRL 4-0 27XMF (SUTURE) ×1 IMPLANT
SYR 30ML LL (SYRINGE) ×2 IMPLANT
TOWEL OR 17X26 4PK STRL BLUE (TOWEL DISPOSABLE) ×1 IMPLANT
WATER STERILE IRR 500ML POUR (IV SOLUTION) ×1 IMPLANT

## 2021-12-15 NOTE — Anesthesia Preprocedure Evaluation (Signed)
Anesthesia Evaluation  ?Patient identified by MRN, date of birth, ID band ?Patient awake ? ? ? ?Reviewed: ?Allergy & Precautions, H&P , NPO status , Patient's Chart, lab work & pertinent test results, reviewed documented beta blocker date and time  ? ?History of Anesthesia Complications ?Negative for: history of anesthetic complications ? ?Airway ?Mallampati: III ? ?TM Distance: >3 FB ?Neck ROM: full ? ? ? Dental ? ?(+) Teeth Intact, Poor Dentition, Dental Advidsory Given ?  ?Pulmonary ?neg pulmonary ROS,  ?  ?Pulmonary exam normal ? ? ? ? ? ? ? Cardiovascular ?Exercise Tolerance: Good ?hypertension, On Medications ?(-) angina(-) Past MI and (-) Cardiac Stents Normal cardiovascular exam(-) dysrhythmias (-) Valvular Problems/Murmurs ?Rate:Normal ? ? ?  ?Neuro/Psych ?negative neurological ROS ? negative psych ROS  ? GI/Hepatic ?negative GI ROS, Neg liver ROS,   ?Endo/Other  ?diabetes, Poorly Controlled, Type 1, Insulin Dependent, Oral Hypoglycemic Agents ? Renal/GU ?negative Renal ROS  ?negative genitourinary ?  ?Musculoskeletal ? ? Abdominal ?  ?Peds ? 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 ? Reproductive/Obstetrics ?negative OB ROS ? ?  ? ? ? ? ? ? ? ? ? ? ? ? ? ?  ?  ? ? ? ? ? ? ? ? ?Anesthesia Physical ? ?Anesthesia Plan ? ?ASA: 3 ? ?Anesthesia Plan: General  ? ?Post-op Pain Management:   ? ?Induction: Intravenous ? ?PONV Risk Score and Plan: 3 and Ondansetron, Dexamethasone, Midazolam and Treatment may vary due to age or medical condition ? ?Airway Management Planned: LMA and Oral ETT ? ?Additional Equipment:  ? ?Intra-op Plan:  ? ?Post-operative Plan: Extubation in OR ? ?Informed Consent: I have reviewed the patients  History and Physical, chart, labs and discussed the procedure including the risks, benefits and alternatives for the proposed anesthesia with the patient or authorized representative who has indicated his/her understanding and acceptance.  ? ? ? ? ? ?Plan Discussed with: CRNA ? ?Anesthesia Plan Comments:   ? ? ? ? ? ? ?Anesthesia Quick Evaluation ? ?

## 2021-12-15 NOTE — Interval H&P Note (Signed)
No change. OK to proceed.

## 2021-12-15 NOTE — Op Note (Addendum)
Pre-Op Dx: Right lower extremity venous stasis ulcer ?Post-Op Dx: same ?Anesthesia: MAC ?EBL: 85m ?Complications:  none apparent ?Specimen: none ?Procedure: debridement of Right lower extremity venous stasis ulcer, myriad skin matrix placement ?Surgeon: SLysle Pearl? ?Indications for procedure: ?See H&P ? ?Description of Procedure:  ?Consent obtained, time out performed.  Patient placed in supine position.  Area sterilized and draped in usual position.  Local infused around wound.  #15 blade used to gently debride the superficial exudative material at edges of the wound.  Total amount of tissue debrided measures 320mx 50m80m 50mm71mThe 6cm x 7cm (42 SQCM) wound then irrigated and covered with myriad skin matrix soaked in saline, cut to shape of wound to cover granulation tissue.  3-0 vicryl than used to secure the matrix circumferentially, making sure matix lays flat against the wound.  Wound then covered with hydrogel, adaptec, non-adherent gauze, and then covered with tegaderm.  Pt tolerated procedure well, and transferred to PACU in stable condition. Sponge and instrument count correct at end of procedure. ? ?

## 2021-12-15 NOTE — Anesthesia Procedure Notes (Signed)
Procedure Name: LMA Insertion ?Date/Time: 12/15/2021 10:44 AM ?Performed by: Elmarie Mainland, CRNA ?Pre-anesthesia Checklist: Patient identified, Emergency Drugs available, Suction available and Patient being monitored ?Patient Re-evaluated:Patient Re-evaluated prior to induction ?Oxygen Delivery Method: Circle system utilized ?Preoxygenation: Pre-oxygenation with 100% oxygen ?Induction Type: IV induction ?Ventilation: Mask ventilation without difficulty ?LMA: LMA inserted ?LMA Size: 4.5 ?Number of attempts: 1 ?Placement Confirmation: positive ETCO2 and breath sounds checked- equal and bilateral ?Tube secured with: Tape ?Dental Injury: Teeth and Oropharynx as per pre-operative assessment  ? ? ? ? ?

## 2021-12-15 NOTE — Discharge Instructions (Addendum)
Graft, Care After ?This sheet gives you information about how to care for yourself after your procedure. Your health care provider may also give you more specific instructions. If you have problems or questions, contact your health care provider. ?What can I expect after the procedure? ?After the procedure, it is common to have: ?Soreness. ?Bruising. ?Itching. ?Follow these instructions at home: ? ?site care ?KEEP DRESSING INTACT UNTIL NEXT OFFICE VISIT. DO NOT REMOVE.  IF IT STARTS LEAKING, REINFORCE BY APPLYING GAUZE OVER AREA AND WRAP WITH KERLEX ROLLS. ? ?General instructions ?Rest and then return to your normal activities as told by your health care provider. ? tylenol and advil as needed for discomfort.  Please alternate between the two every four hours as needed for pain.   ? Use narcotics, if prescribed, only when tylenol and motrin is not enough to control pain. ? 325-650mg  every 8hrs to max of 3000mg /24hrs (including the 325mg  in every norco dose) for the tylenol.   ? Advil up to 800mg  per dose every 8hrs as needed for pain.   ?Keep all follow-up visits as told by your health care provider. This is important. ?Contact a health care provider if: ?You have redness, swelling, or pain around your site. ?You have fluid or blood coming from your site. ?Your site feels warm to the touch. ?You have pus or a bad smell coming from your site. ?You have a fever. ?Your sutures, skin glue, or adhesive strips loosen or come off sooner than expected. ?Get help right away if: ?You have bleeding that does not stop with pressure or a dressing. ?Summary ?After the procedure, it is common to have some soreness, bruising, and itching at the site. ?Follow instructions from your health care provider about how to take care of your site. ?Check your site every day for signs of infection. ?Contact a health care provider if you have redness, swelling, or pain around your site, or your site feels warm to the touch. ?Keep all follow-up  visits as told by your health care provider. This is important. ?This information is not intended to replace advice given to you by your health care provider. Make sure you discuss any questions you have with your health care provider. ?Document Released: 10/08/2015 Document Revised: 03/11/2018 Document Reviewed: 03/11/2018 ?Elsevier Interactive Patient Education ? 2019 [employer] ? ?AMBULATORY SURGERY  ?DISCHARGE INSTRUCTIONS ? ? ?The drugs that you were given will stay in your system until tomorrow so for the next 24 hours you should not: ? ?Drive an automobile ?Make any legal decisions ?Drink any alcoholic beverage ? ? ?You may resume regular meals tomorrow.  Today it is better to start with liquids and gradually work up to solid foods. ? ?You may eat anything you prefer, but it is better to start with liquids, then soup and crackers, and gradually work up to solid foods. ? ? ?Please notify your doctor immediately if you have any unusual bleeding, trouble breathing, redness and pain at the surgery site, drainage, fever, or pain not relieved by medication. ?  ? ?Your post-operative visit with Dr.                     ? ? ?           ?     is: Date:                        Time:   ? ?Please call to schedule  your post-operative visit. ? ?Additional Instructions:  ?

## 2021-12-15 NOTE — Transfer of Care (Signed)
Immediate Anesthesia Transfer of Care Note ? ?Patient: Robert Brennan ? ?Procedure(s) Performed: GRAFT APPLICATION (Right: Leg Lower) ? ?Patient Location: PACU ? ?Anesthesia Type:General ? ?Level of Consciousness: drowsy and patient cooperative ? ?Airway & Oxygen Therapy: Patient Spontanous Breathing and Patient connected to face mask oxygen ? ?Post-op Assessment: Report given to RN and Post -op Vital signs reviewed and stable ? ?Post vital signs: Reviewed and stable ? ?Last Vitals:  ?Vitals Value Taken Time  ?BP 106/64 12/15/21 1134  ?Temp    ?Pulse 88 12/15/21 1136  ?Resp 13 12/15/21 1136  ?SpO2 100 % 12/15/21 1136  ?Vitals shown include unvalidated device data. ? ?Last Pain:  ?Vitals:  ? 12/15/21 0952  ?TempSrc: Temporal  ?PainSc: 0-No pain  ?   ? ?  ? ?Complications: No notable events documented. ?

## 2021-12-17 NOTE — Anesthesia Postprocedure Evaluation (Signed)
Anesthesia Post Note ? ?Patient: Robert Brennan ? ?Procedure(s) Performed: GRAFT APPLICATION (Right: Leg Lower) ? ?Patient location during evaluation: PACU ?Anesthesia Type: General ?Level of consciousness: awake and alert ?Pain management: pain level controlled ?Vital Signs Assessment: post-procedure vital signs reviewed and stable ?Respiratory status: spontaneous breathing, nonlabored ventilation, respiratory function stable and patient connected to nasal cannula oxygen ?Cardiovascular status: blood pressure returned to baseline and stable ?Postop Assessment: no apparent nausea or vomiting ?Anesthetic complications: no ? ? ?No notable events documented. ? ? ?Last Vitals:  ?Vitals:  ? 12/15/21 1215 12/15/21 1236  ?BP: 128/82 (!) 143/84  ?Pulse: 95 88  ?Resp: 16 14  ?Temp:  (!) 36.1 ?C  ?SpO2: 97%   ?  ?Last Pain:  ?Vitals:  ? 12/15/21 1236  ?TempSrc: Temporal  ?PainSc: 0-No pain  ? ? ?  ?  ?  ?  ?  ?  ? ?Lenard Simmer ? ? ? ? ?

## 2021-12-20 ENCOUNTER — Ambulatory Visit: Payer: BLUE CROSS/BLUE SHIELD | Admitting: Physician Assistant

## 2021-12-26 ENCOUNTER — Encounter (INDEPENDENT_AMBULATORY_CARE_PROVIDER_SITE_OTHER): Payer: BC Managed Care – PPO

## 2021-12-26 ENCOUNTER — Ambulatory Visit (INDEPENDENT_AMBULATORY_CARE_PROVIDER_SITE_OTHER): Payer: BC Managed Care – PPO | Admitting: Vascular Surgery

## 2022-01-25 NOTE — Progress Notes (Signed)
? ? ? ? ?MRN : [id number] ? ?Robert Brennan is a 51 y.o. ([birth year]) male who presents with chief complaint of legs swell. ? ?History of Present Illness:  ? ?The patient returns to the office for followup evaluation regarding leg swelling.  The swelling has persisted and the pain associated with swelling continues. There have not been any interval development of a ulcerations or wounds. ? ?Since the previous visit the patient has been wearing graduated compression stockings and has noted little if any improvement in the lymphedema. The patient has been using compression routinely morning until night. ? ?The patient also states elevation during the day and exercise is being done too.  ? ?Duplex ultrasound of the venous system performed today demonstrates normal deep venous system bilaterally.  Superficial reflux is not identified bilaterally. ? ?No outpatient medications have been marked as taking for the 01/26/22 encounter (Appointment) with Delana Meyer, Dolores Lory, MD.  ? ? ?Past Medical History:  ?Diagnosis Date  ? Azoospermia   ? Gout   ? per pt stable as of 03-24-2016  ? Hypertension   ? Hypogonadism, testicular   ? Type 2 diabetes mellitus ([place])   ? Wears glasses   ? ? ?Past Surgical History:  ?Procedure Laterality Date  ? BREAST REDUCTION SURGERY  age 20 and 76  ? COLONOSCOPY  2011  ? GRAFT APPLICATION Right [id number]  ? Procedure: GRAFT APPLICATION;  Surgeon: Benjamine Sprague, DO;  Location: [facility];  Service: General;  Laterality: Right;  ? TESTICLE BIOPSY N/A 04/04/2016  ? Procedure: BIOPSY TESTICULAR;  Surgeon: Festus Aloe, MD;  Location: [facility];  Service: Urology;  Laterality: N/A;  ? WOUND DEBRIDEMENT Right 11/02/2021  ? Procedure: DEBRIDEMENT WOUND;  Surgeon: Benjamine Sprague, DO;  Location: [facility];  Service: General;  Laterality: Right;  ? ? ?Social History ?Social History  ? ?Tobacco Use  ? Smoking status: Never  ? Smokeless tobacco: Never  ?Vaping Use  ? Vaping Use: Never used  ?Substance Use  Topics  ? Alcohol use: No  ? Drug use: No  ? ? ?Family History ?Family History  ?Problem Relation Age of Onset  ? Arthritis Mother   ? Hypertension Mother   ? Cancer Father   ? ? ?Allergies  ?Allergen Reactions  ? Thiazide-Type Diuretics Other (See Comments)  ?  Low K  ? ? ? ?REVIEW OF SYSTEMS (Negative unless checked) ? ?Constitutional: [] Weight loss  [] Fever  [] Chills ?Cardiac: [] Chest pain   [] Chest pressure   [] Palpitations   [] Shortness of breath when laying flat   [] Shortness of breath with exertion. ?Vascular:  [] Pain in legs with walking   [x] Pain in legs with standing  [] History of DVT   [] Phlebitis   [x] Swelling in legs   [] Varicose veins   [] Non-healing ulcers ?Pulmonary:   [] Uses home oxygen   [] Productive cough   [] Hemoptysis   [] Wheeze  [] COPD   [] Asthma ?Neurologic:  [] Dizziness   [] Seizures   [] History of stroke   [] History of TIA  [] Aphasia   [] Vissual changes   [] Weakness or numbness in arm   [] Weakness or numbness in leg ?Musculoskeletal:   [] Joint swelling   [] Joint pain   [] Low back pain ?Hematologic:  [] Easy bruising  [] Easy bleeding   [] Hypercoagulable state   [] Anemic ?Gastrointestinal:  [] Diarrhea   [] Vomiting  [] Gastroesophageal reflux/heartburn   [] Difficulty swallowing. ?Genitourinary:  [] Chronic kidney disease   [] Difficult urination  [] Frequent urination   [] Blood in urine ?Skin:  [] Rashes   [] Ulcers  ?  Psychological:  [] History of anxiety   []  History of major depression. ? ?Physical Examination ? ?There were no vitals filed for this visit. ?There is no height or weight on file to calculate BMI. ?Gen: WD/WN, NAD ?Head: [place]/AT, No temporalis wasting.  ?Ear/Nose/Throat: Hearing grossly intact, nares w/o erythema or drainage, pinna without lesions ?Eyes: PER, EOMI, sclera nonicteric.  ?Neck: Supple, no gross masses.  No JVD.  ?Pulmonary:  Good air movement, no audible wheezing, no use of accessory muscles.  ?Cardiac: RRR, precordium not hyperdynamic. ?Vascular:  scattered varicosities  present bilaterally.  Mild venous stasis changes to the legs bilaterally.  2+ soft pitting edema mostly in the right ankle ?Vessel Right Left  ?Radial Palpable Palpable  ?Gastrointestinal: soft, non-distended. No guarding/no peritoneal signs.  ?Musculoskeletal: M/S 5/5 throughout.  No deformity.  ?Neurologic: CN 2-12 intact. Pain and light touch intact in extremities.  Symmetrical.  Speech is fluent. Motor exam as listed above. ?Psychiatric: Judgment intact, Mood & affect appropriate for pt's clinical situation. ?Dermatologic: Venous rashes no ulcers noted.  No changes consistent with cellulitis. ?Lymph : No lichenification or skin changes of chronic lymphedema. ? ?CBC ?Lab Results  ?Component Value Date  ? WBC 6.3 08/15/2015  ? HGB 14.3 04/04/2016  ? HCT 42.0 04/04/2016  ? MCV 85.9 08/15/2015  ? PLT 191 08/15/2015  ? ? ?BMET ?   ?Component Value Date/Time  ? NA 137 12/13/2021 1512  ? K 4.2 12/13/2021 1512  ? CL 103 12/13/2021 1512  ? CO2 28 12/13/2021 1512  ? GLUCOSE 127 (H) 12/13/2021 1512  ? BUN 14 12/13/2021 1512  ? CREATININE 1.16 12/13/2021 1512  ? CREATININE 1.08 06/25/2013 1111  ? CALCIUM 9.3 12/13/2021 1512  ? GFRNONAA >60 12/13/2021 1512  ? GFRAA >60 08/15/2015 1333  ? ?CrCl cannot be calculated (Patient's most recent lab result is older than the maximum 21 days allowed.). ? ?COAG ?No results found for: INR, PROTIME ? ?Radiology ?No results found. ? ? ?Assessment/Plan ?1. Lymphedema ?Recommend: ? ?No surgery or intervention at this point in time.   ? ?I have reviewed my discussion with the patient regarding lymphedema and why it  causes symptoms.  Patient will continue wearing graduated compression on a daily basis. The patient should put the compression on first thing in the morning and removing them in the evening. The patient should not sleep in the compression.  ? ?In addition, behavioral modification throughout the day will be continued.  This will include frequent elevation (such as in a recliner),  use of over the counter pain medications as needed and exercise such as walking. ? ?The systemic causes for chronic edema such as liver, kidney and cardiac etiologies do not appear to have significant changed over the past year.   ? ?Despite conservative treatments including graduated compression therapy class 1 and behavioral modification including exercise and elevation the patient  has not obtained adequate control of the lymphedema.  The patient still has stage 3 lymphedema and therefore, I believe that a lymph pump should be added to improve the control of the patient's lymphedema.  Additionally, a lymph pump is warranted because it will reduce the risk of cellulitis and ulceration in the future. ? ?Patient should follow-up in six months   ? ?2. Chronic venous insufficiency ?Recommend: ? ?No surgery or intervention at this point in time.   ? ?I have reviewed my discussion with the patient regarding lymphedema and why it  causes symptoms.  Patient will continue wearing graduated compression  on a daily basis. The patient should put the compression on first thing in the morning and removing them in the evening. The patient should not sleep in the compression.  ? ?In addition, behavioral modification throughout the day will be continued.  This will include frequent elevation (such as in a recliner), use of over the counter pain medications as needed and exercise such as walking. ? ?The systemic causes for chronic edema such as liver, kidney and cardiac etiologies do not appear to have significant changed over the past year.   ? ?Despite conservative treatments including graduated compression therapy class 1 and behavioral modification including exercise and elevation the patient  has not obtained adequate control of the lymphedema.  The patient still has stage 3 lymphedema and therefore, I believe that a lymph pump should be added to improve the control of the patient's lymphedema.  Additionally, a lymph pump is  warranted because it will reduce the risk of cellulitis and ulceration in the future. ? ?Patient should follow-up in six months   ? ?3. Essential hypertension ?Continue antihypertensive medications as already o

## 2022-01-26 ENCOUNTER — Encounter (INDEPENDENT_AMBULATORY_CARE_PROVIDER_SITE_OTHER): Payer: Self-pay | Admitting: Vascular Surgery

## 2022-01-26 ENCOUNTER — Ambulatory Visit (INDEPENDENT_AMBULATORY_CARE_PROVIDER_SITE_OTHER): Payer: BC Managed Care – PPO

## 2022-01-26 ENCOUNTER — Ambulatory Visit (INDEPENDENT_AMBULATORY_CARE_PROVIDER_SITE_OTHER): Payer: BC Managed Care – PPO | Admitting: Vascular Surgery

## 2022-01-26 VITALS — BP 137/90 | HR 89 | Resp 16 | Ht 72.0 in | Wt 275.8 lb

## 2022-01-26 DIAGNOSIS — I872 Venous insufficiency (chronic) (peripheral): Secondary | ICD-10-CM

## 2022-01-26 DIAGNOSIS — I890 Lymphedema, not elsewhere classified: Secondary | ICD-10-CM

## 2022-01-26 DIAGNOSIS — I10 Essential (primary) hypertension: Secondary | ICD-10-CM

## 2022-01-26 DIAGNOSIS — Z794 Long term (current) use of insulin: Secondary | ICD-10-CM

## 2022-01-26 DIAGNOSIS — E119 Type 2 diabetes mellitus without complications: Secondary | ICD-10-CM

## 2022-01-26 DIAGNOSIS — E782 Mixed hyperlipidemia: Secondary | ICD-10-CM

## 2022-02-08 ENCOUNTER — Encounter: Payer: Self-pay | Admitting: Plastic Surgery

## 2022-02-08 ENCOUNTER — Ambulatory Visit (INDEPENDENT_AMBULATORY_CARE_PROVIDER_SITE_OTHER): Payer: BC Managed Care – PPO | Admitting: Plastic Surgery

## 2022-02-08 VITALS — BP 143/84 | HR 114 | Ht 72.0 in | Wt 280.8 lb

## 2022-02-08 DIAGNOSIS — S81801A Unspecified open wound, right lower leg, initial encounter: Secondary | ICD-10-CM

## 2022-02-08 NOTE — Progress Notes (Signed)
? ?Referring Provider ?Aundria Mems, MD ?[address] ?[place],  [place] [address]  ? ?CC:  ?Chief Complaint  ?Patient presents with  ? Consult  ?   ? ?Robert Brennan is an 51 y.o. male.  ?HPI: Patient presents with chronic wound in his right calf.  He says its been present for about the past 6 months.  He states that he initially bumped his leg and developed a blood clot beneath the skin.  He reports the tension from that caused the skin to die which ultimately was debrided in February in [place].  The wound made some progress in healing but ultimately stalled and so a myriad wound matrix was applied in March.  The patient states the wound is gradually healing in from the edges but is doing so very slowly.  He also has been diagnosed with venous insufficiency and lymphedema.  Ultrasounds have not demonstrated a surgically correctable venous outflow problem.  He has good vascular inflow according to their notes.  He is in the process of obtaining compression stockings which she has yet to wear diligently.  He has previously had elevation in his blood glucose but says that that is under much better control now with a number of recent blood glucose measurements at around 100.  He has been applying Mepilex border to the wound for wound care. ? ?No Active Allergies ? ?Outpatient Encounter Medications as of 02/08/2022  ?Medication Sig  ? acetaminophen (TYLENOL) 500 MG tablet Take 500-1,000 mg by mouth every 6 (six) hours as needed (for pain.).  ? allopurinol (ZYLOPRIM) 100 MG tablet Take 200 mg by mouth in the morning.  ? amLODipine (NORVASC) 10 MG tablet Take 10 mg by mouth in the morning.  ? atorvastatin (LIPITOR) 10 MG tablet Take 10 mg by mouth at bedtime.  ? benazepril (LOTENSIN) 40 MG tablet Take 40 mg by mouth at bedtime.  ? ibuprofen (ADVIL) 800 MG tablet Take 1 tablet (800 mg total) by mouth every 8 (eight) hours as needed for mild pain or moderate pain.  ? metFORMIN (GLUCOPHAGE) 1000 MG tablet Take  1,000 mg by mouth 2 (two) times daily.  ? OZEMPIC, 1 MG/DOSE, 4 MG/3ML SOPN Inject 1 mg into the skin every Sunday.  ? potassium chloride (KLOR-CON) 10 MEQ tablet Take 20 mEq by mouth in the morning.  ? spironolactone (ALDACTONE) 25 MG tablet Take 25 mg by mouth 2 (two) times daily.  ? tadalafil (CIALIS) 5 MG tablet Take 5 mg by mouth daily as needed for erectile dysfunction.  ? traMADol (ULTRAM) 50 MG tablet Take 1 tablet (50 mg total) by mouth every 8 (eight) hours as needed.  ? TRESIBA FLEXTOUCH 200 UNIT/ML FlexTouch Pen Inject 54 Units into the skin in the morning and at bedtime.  ? ?No facility-administered encounter medications on file as of 02/08/2022.  ?  ? ?Past Medical History:  ?Diagnosis Date  ? Azoospermia   ? Gout   ? per pt stable as of 03-24-2016  ? Hypertension   ? Hypogonadism, testicular   ? Type 2 diabetes mellitus ([place])   ? Wears glasses   ? ? ?Past Surgical History:  ?Procedure Laterality Date  ? BREAST REDUCTION SURGERY  age 13 and 5  ? COLONOSCOPY  2011  ? GRAFT APPLICATION Right 3/64/6803  ? Procedure: GRAFT APPLICATION;  Surgeon: Benjamine Sprague, DO;  Location: [facility];  Service: General;  Laterality: Right;  ? TESTICLE BIOPSY N/A 04/04/2016  ? Procedure: BIOPSY TESTICULAR;  Surgeon: Festus Aloe,  MD;  Location: [facility];  Service: Urology;  Laterality: N/A;  ? WOUND DEBRIDEMENT Right 11/02/2021  ? Procedure: DEBRIDEMENT WOUND;  Surgeon: Benjamine Sprague, DO;  Location: [facility];  Service: General;  Laterality: Right;  ? ? ?Family History  ?Problem Relation Age of Onset  ? Arthritis Mother   ? Hypertension Mother   ? Cancer Father   ? ? ?Social History  ? ?Social History Narrative  ? Lives with wife  ?  ? ?Review of Systems ?General: Denies fevers, chills, weight loss ?CV: Denies chest pain, shortness of breath, palpitations ? ?Physical Exam ? ?  02/08/2022  ?  3:06 PM 01/26/2022  ?  8:31 AM 12/15/2021  ? 12:36 PM  ?Vitals with BMI  ?Height _0  _1    ?Weight 280 lbs 13 oz 275  lbs 13 oz   ?BMI 38.07 37.4   ?Systolic 253 664 403  ?Diastolic 84 90 84  ?Pulse 114 89 88  ?  ?General:  No acute distress,  Alert and oriented, Non-Toxic, Normal speech and affect ?Right leg: Right lateral calf wound approximately 7 x 7 cm in size.  Mostly granulation tissue at the base and some hyper granulated areas.  No surrounding purulence or signs of infection.  Would call him moderate lymphedema.  Foot appears well perfused. ? ?Assessment/Plan ?I long discussion with the patient about his options.  We discussed continuing with wound care versus an attempt at skin graft placement.  I explained that the fact that the wound is gradually healing in from the edges albeit slowly is a good sign.  I did explain to him that there was a chance that the graft would not take and if that were to occur he would be essentially in the same position he is in now.  I did explain that it would be beneficial for him to adhere to his leg elevation and lymphedema control measures leading up to and after the skin graft procedure.  We reviewed other risks of the procedure that include bleeding, infection, damage to surrounding structures and need for additional procedures.  He is feeling understanding and would like to move forward with skin graft in a few weeks.  All of his questions were answered. ? ?Cindra Presume ?02/08/2022, 4:42 PM  ? ? ?  ?

## 2022-02-10 ENCOUNTER — Telehealth: Payer: Self-pay | Admitting: *Deleted

## 2022-02-10 NOTE — Telephone Encounter (Signed)
Fax order status notification received from [facility]: "[facility] has provided service for this patient; no further action is required"

## 2022-02-13 ENCOUNTER — Telehealth: Payer: Self-pay | Admitting: Plastic Surgery

## 2022-02-13 NOTE — Telephone Encounter (Signed)
LVM to schedule surgery. Requested call back

## 2022-02-23 ENCOUNTER — Encounter (HOSPITAL_BASED_OUTPATIENT_CLINIC_OR_DEPARTMENT_OTHER): Payer: Self-pay | Admitting: Plastic Surgery

## 2022-02-23 ENCOUNTER — Encounter: Payer: Self-pay | Admitting: Surgical

## 2022-02-23 ENCOUNTER — Other Ambulatory Visit: Payer: Self-pay

## 2022-02-23 ENCOUNTER — Ambulatory Visit (INDEPENDENT_AMBULATORY_CARE_PROVIDER_SITE_OTHER): Payer: BC Managed Care – PPO | Admitting: Surgical

## 2022-02-23 VITALS — BP 160/91 | HR 108 | Ht 72.0 in | Wt 281.4 lb

## 2022-02-23 DIAGNOSIS — S81801A Unspecified open wound, right lower leg, initial encounter: Secondary | ICD-10-CM

## 2022-02-23 DIAGNOSIS — Z794 Long term (current) use of insulin: Secondary | ICD-10-CM

## 2022-02-23 DIAGNOSIS — E119 Type 2 diabetes mellitus without complications: Secondary | ICD-10-CM

## 2022-02-23 DIAGNOSIS — E1169 Type 2 diabetes mellitus with other specified complication: Secondary | ICD-10-CM

## 2022-02-23 DIAGNOSIS — I872 Venous insufficiency (chronic) (peripheral): Secondary | ICD-10-CM

## 2022-02-23 NOTE — Progress Notes (Signed)
Patient ID: Robert Brennan, male    DOB: 08/03/1971, 51 y.o.   MRN: [id number]  Chief Complaint  Patient presents with   Pre-op Exam      ICD-10-CM   1. Type 2 diabetes mellitus treated with insulin (HCC)  E11.9 Hemoglobin A1c   Z79.4     2. Wound of right lower extremity, initial encounter  S81.801A Hemoglobin A1c      History of Present Illness: Robert Brennan is a 51 y.o.  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  He presents for preoperative evaluation for upcoming procedure, split-thickness skin graft to right leg, scheduled for 03/07/2022 with Dr. Arita MissPace.  The patient has not had problems with anesthesia. No history of DVT/PE.  No family history of DVT/PE.  No family or personal history of bleeding or clotting disorders.  Patient is not currently taking any blood thinners.  No history of CVA/MI.   Summary of Previous Visit: Patient with chronic wound in his right calf, has been present for the past 6 months, developed from bumping his leg and developing a blood clot underneath the skin.  He has been diagnosed with venous insufficiency and lymphedema.  He has good vascular inflow.  PMH Significant for: Type 2 diabetes mellitus treated with insulin with most recent A1c 11.8 in November 2022.  Patient reports no recent A1c.  Hypertension, peripheral neuropathy, right ankle wound.  Patient denies any cardiac history, denies history of cardiac stents.  Reports she is feeling well, no recent changes to his health.  He reports he is prepared for surgery, he does not report he has any specific questions.   Past Medical History: Allergies: No Active Allergies  Current Medications:  Current Outpatient Medications:    acetaminophen (TYLENOL) 500 MG tablet, Take 500-1,000 mg by mouth every 6 (six) hours as needed (for pain.)., Disp: , Rfl:    allopurinol (ZYLOPRIM) 100 MG tablet, Take 200 mg by mouth in the morning., Disp: , Rfl:    amLODipine (NORVASC) 10 MG tablet, Take 10 mg  by mouth in the morning., Disp: , Rfl:    atorvastatin (LIPITOR) 10 MG tablet, Take 10 mg by mouth at bedtime., Disp: , Rfl:    benazepril (LOTENSIN) 40 MG tablet, Take 40 mg by mouth at bedtime., Disp: , Rfl:    ibuprofen (ADVIL) 800 MG tablet, Take 1 tablet (800 mg total) by mouth every 8 (eight) hours as needed for mild pain or moderate pain., Disp: 30 tablet, Rfl: 0   metFORMIN (GLUCOPHAGE) 1000 MG tablet, Take 1,000 mg by mouth 2 (two) times daily., Disp: , Rfl:    OZEMPIC, 1 MG/DOSE, 4 MG/3ML SOPN, Inject 1 mg into the skin every Sunday., Disp: , Rfl:    potassium chloride (KLOR-CON) 10 MEQ tablet, Take 20 mEq by mouth in the morning., Disp: , Rfl:    spironolactone (ALDACTONE) 25 MG tablet, Take 25 mg by mouth 2 (two) times daily., Disp: , Rfl:    tadalafil (CIALIS) 5 MG tablet, Take 5 mg by mouth daily as needed for erectile dysfunction., Disp: , Rfl:    traMADol (ULTRAM) 50 MG tablet, Take 1 tablet (50 mg total) by mouth every 8 (eight) hours as needed., Disp: 10 tablet, Rfl: 0   TRESIBA FLEXTOUCH 200 UNIT/ML FlexTouch Pen, Inject 54 Units into the skin in the morning and at bedtime., Disp: , Rfl:   Past Medical Problems: Past Medical History:  Diagnosis Date   Azoospermia  Gout    per pt stable as of 03-24-2016   Hypertension    Hypogonadism, testicular    Type 2 diabetes mellitus (HCC)    Wears glasses     Past Surgical History: Past Surgical History:  Procedure Laterality Date   BREAST REDUCTION SURGERY  age 88 and 67   COLONOSCOPY  2011   GRAFT APPLICATION Right 12/15/2021   Procedure: GRAFT APPLICATION;  Surgeon: Sung Amabile, DO;  Location: [facility];  Service: General;  Laterality: Right;   TESTICLE BIOPSY N/A 04/04/2016   Procedure: BIOPSY TESTICULAR;  Surgeon: Jerilee Field, MD;  Location: [facility];  Service: Urology;  Laterality: N/A;   WOUND DEBRIDEMENT Right 11/02/2021   Procedure: DEBRIDEMENT WOUND;  Surgeon: Sung Amabile, DO;  Location: [facility]  [facility];  Service: General;  Laterality: Right;    Social History: Social History   Socioeconomic History   Marital status: Married    Spouse name: Nurse, mental health   Number of children: 1   Years of education: Not on file   Highest education level: Not on file  Occupational History   Not on file  Tobacco Use   Smoking status: Never   Smokeless tobacco: Never  Vaping Use   Vaping Use: Never used  Substance and Sexual Activity   Alcohol use: No   Drug use: No   Sexual activity: Not on file  Other Topics Concern   Not on file  Social History Narrative   Lives with wife   Social Determinants of Health   Financial Resource Strain: Not on file  Food Insecurity: Not on file  Transportation Needs: Not on file  Physical Activity: Not on file  Stress: Not on file  Social Connections: Not on file  Intimate Partner Violence: Not on file    Family History: Family History  Problem Relation Age of Onset   Arthritis Mother    Hypertension Mother    Cancer Father     Review of Systems: Review of Systems  Constitutional: Negative.   Respiratory: Negative.    Cardiovascular: Negative.    Physical Exam: Vital Signs BP (!) 160/91 (BP Location: Left Arm, Patient Position: Sitting, Cuff Size: Large)   Pulse (!) 108   Ht 6' (1.829 m)   Wt 281 lb 6.4 oz (127.6 kg)   SpO2 97%   BMI 38.16 kg/m   Physical Exam Constitutional:      General: Not in acute distress.    Appearance: Normal appearance. Not ill-appearing.  HENT:     Head: Normocephalic and atraumatic.  Eyes:     Pupils: Pupils are equal, round Neck:     Musculoskeletal: Normal range of motion.  Cardiovascular:     Rate and Rhythm: Normal rate    Pulses: Normal pulses.  Pulmonary:     Effort: Pulmonary effort is normal. No respiratory distress.  Abdominal:     General: Abdomen is flat. There is no distension.  Musculoskeletal: Normal range of motion.  Skin:    General: Skin is warm and dry.     Findings: Right leg  wound noted  neurological:     General: No focal deficit present.     Mental Status: Alert and oriented to person, place, and time. Mental status is at baseline.     Motor: No weakness.  Psychiatric:        Mood and Affect: Mood normal.        Behavior: Behavior normal.    Assessment/Plan: The patient is scheduled for split-thickness  skin graft to right lower extremity wound with Dr. Arita Miss.  Risks, benefits, and alternatives of procedure discussed, questions answered and consent obtained.    Smoking Status: Non-smoker; Counseling Given?  N/A  Caprini Score: 5, high; Risk Factors include: Age, insulin-dependent diabetes mellitus, swelling of lower extremity, BMI greater than 25, and length of planned surgery. Recommendation for mechanical prophylaxis. Encourage early ambulation.   Pictures obtained: Pictures were obtained of the patient and placed in the chart with the patient's or guardian's permission.  Post-op Rx sent to pharmacy:  Will send prescription of oxycodone and Zofran on day of surgery  Patient was provided with the General Surgical Risk consent document and Pain Medication Agreement prior to their appointment.  They had adequate time to read through the risk consent documents and Pain Medication Agreement. We also discussed them in person together during this preop appointment. All of their questions were answered to their satisfaction.  Recommended calling if they have any further questions.  Risk consent form and Pain Medication Agreement to be scanned into patient's chart.  The risks that can be encountered with and after a skin graft were discussed and include the following but not limited to these: bleeding, infection, delayed healing, anesthesia risks, skin sensation changes, injury to structures including nerves, blood vessels, and muscles which may be temporary or permanent, allergies to tape, suture materials and glues, blood products, topical preparations or injected  agents, skin contour irregularities, skin discoloration and swelling, deep vein thrombosis, cardiac and pulmonary complications, pain, which may persist, failure of the graft and possible need for revisional surgery or staged procedures.  We discussed due to patient's elevated A1c he is at an increased risk of postoperative complications including poor take of the graft, failure of the entire graft, additional wounds from the donor graft site.  Patient verbally stated understanding.  Placed order for hemoglobin A1c to recheck labs prior to surgery  Electronically signed by: Kermit Balo Ravi Tuccillo, PA-C 02/23/2022 2:20 PM

## 2022-02-23 NOTE — H&P (View-Only) (Signed)
   Patient ID: Robert Brennan, male    DOB: 09/20/1971, 50 y.o.   MRN: [id number]  Chief Complaint  Patient presents with   Pre-op Exam      ICD-10-CM   1. Type 2 diabetes mellitus treated with insulin (HCC)  E11.9 Hemoglobin A1c   Z79.4     2. Wound of right lower extremity, initial encounter  S81.801A Hemoglobin A1c      History of Present Illness: Robert Brennan is a 50 y.o.  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  He presents for preoperative evaluation for upcoming procedure, split-thickness skin graft to right leg, scheduled for 03/07/2022 with Dr. Pace.  The patient has not had problems with anesthesia. No history of DVT/PE.  No family history of DVT/PE.  No family or personal history of bleeding or clotting disorders.  Patient is not currently taking any blood thinners.  No history of CVA/MI.   Summary of Previous Visit: Patient with chronic wound in his right calf, has been present for the past 6 months, developed from bumping his leg and developing a blood clot underneath the skin.  He has been diagnosed with venous insufficiency and lymphedema.  He has good vascular inflow.  PMH Significant for: Type 2 diabetes mellitus treated with insulin with most recent A1c 11.8 in November 2022.  Patient reports no recent A1c.  Hypertension, peripheral neuropathy, right ankle wound.  Patient denies any cardiac history, denies history of cardiac stents.  Reports she is feeling well, no recent changes to his health.  He reports he is prepared for surgery, he does not report he has any specific questions.   Past Medical History: Allergies: No Active Allergies  Current Medications:  Current Outpatient Medications:    acetaminophen (TYLENOL) 500 MG tablet, Take 500-1,000 mg by mouth every 6 (six) hours as needed (for pain.)., Disp: , Rfl:    allopurinol (ZYLOPRIM) 100 MG tablet, Take 200 mg by mouth in the morning., Disp: , Rfl:    amLODipine (NORVASC) 10 MG tablet, Take 10 mg  by mouth in the morning., Disp: , Rfl:    atorvastatin (LIPITOR) 10 MG tablet, Take 10 mg by mouth at bedtime., Disp: , Rfl:    benazepril (LOTENSIN) 40 MG tablet, Take 40 mg by mouth at bedtime., Disp: , Rfl:    ibuprofen (ADVIL) 800 MG tablet, Take 1 tablet (800 mg total) by mouth every 8 (eight) hours as needed for mild pain or moderate pain., Disp: 30 tablet, Rfl: 0   metFORMIN (GLUCOPHAGE) 1000 MG tablet, Take 1,000 mg by mouth 2 (two) times daily., Disp: , Rfl:    OZEMPIC, 1 MG/DOSE, 4 MG/3ML SOPN, Inject 1 mg into the skin every Sunday., Disp: , Rfl:    potassium chloride (KLOR-CON) 10 MEQ tablet, Take 20 mEq by mouth in the morning., Disp: , Rfl:    spironolactone (ALDACTONE) 25 MG tablet, Take 25 mg by mouth 2 (two) times daily., Disp: , Rfl:    tadalafil (CIALIS) 5 MG tablet, Take 5 mg by mouth daily as needed for erectile dysfunction., Disp: , Rfl:    traMADol (ULTRAM) 50 MG tablet, Take 1 tablet (50 mg total) by mouth every 8 (eight) hours as needed., Disp: 10 tablet, Rfl: 0   TRESIBA FLEXTOUCH 200 UNIT/ML FlexTouch Pen, Inject 54 Units into the skin in the morning and at bedtime., Disp: , Rfl:   Past Medical Problems: Past Medical History:  Diagnosis Date   Azoospermia      Gout    per pt stable as of 03-24-2016   Hypertension    Hypogonadism, testicular    Type 2 diabetes mellitus (HCC)    Wears glasses     Past Surgical History: Past Surgical History:  Procedure Laterality Date   BREAST REDUCTION SURGERY  age 88 and 67   COLONOSCOPY  2011   GRAFT APPLICATION Right 12/15/2021   Procedure: GRAFT APPLICATION;  Surgeon: Sung Amabile, DO;  Location: [facility];  Service: General;  Laterality: Right;   TESTICLE BIOPSY N/A 04/04/2016   Procedure: BIOPSY TESTICULAR;  Surgeon: Jerilee Field, MD;  Location: [facility];  Service: Urology;  Laterality: N/A;   WOUND DEBRIDEMENT Right 11/02/2021   Procedure: DEBRIDEMENT WOUND;  Surgeon: Sung Amabile, DO;  Location: [facility]  [facility];  Service: General;  Laterality: Right;    Social History: Social History   Socioeconomic History   Marital status: Married    Spouse name: Nurse, mental health   Number of children: 1   Years of education: Not on file   Highest education level: Not on file  Occupational History   Not on file  Tobacco Use   Smoking status: Never   Smokeless tobacco: Never  Vaping Use   Vaping Use: Never used  Substance and Sexual Activity   Alcohol use: No   Drug use: No   Sexual activity: Not on file  Other Topics Concern   Not on file  Social History Narrative   Lives with wife   Social Determinants of Health   Financial Resource Strain: Not on file  Food Insecurity: Not on file  Transportation Needs: Not on file  Physical Activity: Not on file  Stress: Not on file  Social Connections: Not on file  Intimate Partner Violence: Not on file    Family History: Family History  Problem Relation Age of Onset   Arthritis Mother    Hypertension Mother    Cancer Father     Review of Systems: Review of Systems  Constitutional: Negative.   Respiratory: Negative.    Cardiovascular: Negative.    Physical Exam: Vital Signs BP (!) 160/91 (BP Location: Left Arm, Patient Position: Sitting, Cuff Size: Large)   Pulse (!) 108   Ht 6' (1.829 m)   Wt 281 lb 6.4 oz (127.6 kg)   SpO2 97%   BMI 38.16 kg/m   Physical Exam Constitutional:      General: Not in acute distress.    Appearance: Normal appearance. Not ill-appearing.  HENT:     Head: Normocephalic and atraumatic.  Eyes:     Pupils: Pupils are equal, round Neck:     Musculoskeletal: Normal range of motion.  Cardiovascular:     Rate and Rhythm: Normal rate    Pulses: Normal pulses.  Pulmonary:     Effort: Pulmonary effort is normal. No respiratory distress.  Abdominal:     General: Abdomen is flat. There is no distension.  Musculoskeletal: Normal range of motion.  Skin:    General: Skin is warm and dry.     Findings: Right leg  wound noted  neurological:     General: No focal deficit present.     Mental Status: Alert and oriented to person, place, and time. Mental status is at baseline.     Motor: No weakness.  Psychiatric:        Mood and Affect: Mood normal.        Behavior: Behavior normal.    Assessment/Plan: The patient is scheduled for split-thickness  skin graft to right lower extremity wound with Dr. Arita Miss.  Risks, benefits, and alternatives of procedure discussed, questions answered and consent obtained.    Smoking Status: Non-smoker; Counseling Given?  N/A  Caprini Score: 5, high; Risk Factors include: Age, insulin-dependent diabetes mellitus, swelling of lower extremity, BMI greater than 25, and length of planned surgery. Recommendation for mechanical prophylaxis. Encourage early ambulation.   Pictures obtained: Pictures were obtained of the patient and placed in the chart with the patient's or guardian's permission.  Post-op Rx sent to pharmacy:  Will send prescription of oxycodone and Zofran on day of surgery  Patient was provided with the General Surgical Risk consent document and Pain Medication Agreement prior to their appointment.  They had adequate time to read through the risk consent documents and Pain Medication Agreement. We also discussed them in person together during this preop appointment. All of their questions were answered to their satisfaction.  Recommended calling if they have any further questions.  Risk consent form and Pain Medication Agreement to be scanned into patient's chart.  The risks that can be encountered with and after a skin graft were discussed and include the following but not limited to these: bleeding, infection, delayed healing, anesthesia risks, skin sensation changes, injury to structures including nerves, blood vessels, and muscles which may be temporary or permanent, allergies to tape, suture materials and glues, blood products, topical preparations or injected  agents, skin contour irregularities, skin discoloration and swelling, deep vein thrombosis, cardiac and pulmonary complications, pain, which may persist, failure of the graft and possible need for revisional surgery or staged procedures.  We discussed due to patient's elevated A1c he is at an increased risk of postoperative complications including poor take of the graft, failure of the entire graft, additional wounds from the donor graft site.  Patient verbally stated understanding.  Placed order for hemoglobin A1c to recheck labs prior to surgery  Electronically signed by: Kermit Balo Abdulwahab Demelo, PA-C 02/23/2022 2:20 PM

## 2022-03-07 ENCOUNTER — Other Ambulatory Visit: Payer: Self-pay

## 2022-03-07 ENCOUNTER — Ambulatory Visit (HOSPITAL_BASED_OUTPATIENT_CLINIC_OR_DEPARTMENT_OTHER): Payer: BC Managed Care – PPO | Admitting: Anesthesiology

## 2022-03-07 ENCOUNTER — Encounter (HOSPITAL_BASED_OUTPATIENT_CLINIC_OR_DEPARTMENT_OTHER)
Admission: RE | Disposition: A | Discharge status: Home / Self Care | Payer: Self-pay | Source: Home / Self Care | Attending: Plastic Surgery

## 2022-03-07 ENCOUNTER — Ambulatory Visit (HOSPITAL_BASED_OUTPATIENT_CLINIC_OR_DEPARTMENT_OTHER)
Admission: RE | Admit: 2022-03-07 | Discharge: 2022-03-07 | Disposition: A | Discharge status: Home / Self Care | Payer: BC Managed Care – PPO | Attending: Plastic Surgery | Admitting: Plastic Surgery

## 2022-03-07 ENCOUNTER — Encounter (HOSPITAL_BASED_OUTPATIENT_CLINIC_OR_DEPARTMENT_OTHER): Payer: Self-pay | Admitting: Plastic Surgery

## 2022-03-07 DIAGNOSIS — Y939 Activity, unspecified: Secondary | ICD-10-CM | POA: Insufficient documentation

## 2022-03-07 DIAGNOSIS — W228XXA Striking against or struck by other objects, initial encounter: Secondary | ICD-10-CM | POA: Diagnosis not present

## 2022-03-07 DIAGNOSIS — E1151 Type 2 diabetes mellitus with diabetic peripheral angiopathy without gangrene: Secondary | ICD-10-CM | POA: Insufficient documentation

## 2022-03-07 DIAGNOSIS — I10 Essential (primary) hypertension: Secondary | ICD-10-CM | POA: Diagnosis not present

## 2022-03-07 DIAGNOSIS — Z7984 Long term (current) use of oral hypoglycemic drugs: Secondary | ICD-10-CM | POA: Diagnosis not present

## 2022-03-07 DIAGNOSIS — Z794 Long term (current) use of insulin: Secondary | ICD-10-CM | POA: Insufficient documentation

## 2022-03-07 DIAGNOSIS — S81801A Unspecified open wound, right lower leg, initial encounter: Secondary | ICD-10-CM | POA: Insufficient documentation

## 2022-03-07 DIAGNOSIS — M1990 Unspecified osteoarthritis, unspecified site: Secondary | ICD-10-CM | POA: Insufficient documentation

## 2022-03-07 DIAGNOSIS — M109 Gout, unspecified: Secondary | ICD-10-CM | POA: Insufficient documentation

## 2022-03-07 DIAGNOSIS — E1142 Type 2 diabetes mellitus with diabetic polyneuropathy: Secondary | ICD-10-CM | POA: Diagnosis not present

## 2022-03-07 DIAGNOSIS — E119 Type 2 diabetes mellitus without complications: Secondary | ICD-10-CM

## 2022-03-07 DIAGNOSIS — I890 Lymphedema, not elsewhere classified: Secondary | ICD-10-CM | POA: Insufficient documentation

## 2022-03-07 HISTORY — PX: SKIN SPLIT GRAFT: SHX444

## 2022-03-07 LAB — GLUCOSE, CAPILLARY
Glucose-Capillary: 120 mg/dL — ABNORMAL HIGH (ref 70–99)
Glucose-Capillary: 162 mg/dL — ABNORMAL HIGH (ref 70–99)

## 2022-03-07 SURGERY — APPLICATION, GRAFT, SKIN, SPLIT-THICKNESS
Anesthesia: General | Site: Leg Lower | Laterality: Right

## 2022-03-07 MED ORDER — CHLORHEXIDINE GLUCONATE CLOTH 2 % EX PADS: 6.0000 | MEDICATED_PAD | Freq: Once | CUTANEOUS | Status: DC

## 2022-03-07 MED ORDER — FENTANYL CITRATE (PF) 100 MCG/2ML IJ SOLN
25.0000 ug | INTRAMUSCULAR | Status: DC | PRN
  Administered 2022-03-07 (×3): 50 ug via INTRAVENOUS

## 2022-03-07 MED ORDER — ACETAMINOPHEN 500 MG PO TABS
1000.0000 mg | ORAL_TABLET | Freq: Once | ORAL | Status: AC
  Administered 2022-03-07: 1000 mg via ORAL

## 2022-03-07 MED ORDER — MINERAL OIL LIGHT 100 % EX OIL
TOPICAL_OIL | CUTANEOUS | Status: DC | PRN
  Administered 2022-03-07: 1 via TOPICAL

## 2022-03-07 MED ORDER — LIDOCAINE-EPINEPHRINE 1 %-1:100000 IJ SOLN
INTRAMUSCULAR | Status: AC
  Filled 2022-03-07: qty 1

## 2022-03-07 MED ORDER — ONDANSETRON HCL 4 MG/2ML IJ SOLN
4.0000 mg | Freq: Once | INTRAMUSCULAR | Status: DC | PRN
Start: 2022-03-07 — End: 2022-03-07

## 2022-03-07 MED ORDER — MIDAZOLAM HCL 5 MG/5ML IJ SOLN
INTRAMUSCULAR | Status: DC | PRN
  Administered 2022-03-07: 2 mg via INTRAVENOUS

## 2022-03-07 MED ORDER — OXYCODONE HCL 5 MG PO TABS
5.0000 mg | ORAL_TABLET | Freq: Once | ORAL | Status: AC | PRN
  Administered 2022-03-07: 5 mg via ORAL

## 2022-03-07 MED ORDER — CEFAZOLIN IN SODIUM CHLORIDE 3-0.9 GM/100ML-% IV SOLN
3.0000 g | INTRAVENOUS | Status: AC
  Administered 2022-03-07: 3 g via INTRAVENOUS

## 2022-03-07 MED ORDER — LACTATED RINGERS IV SOLN: INTRAVENOUS | Status: DC

## 2022-03-07 MED ORDER — OXYCODONE HCL 5 MG PO TABS
ORAL_TABLET | ORAL | Status: AC
  Filled 2022-03-07: qty 1

## 2022-03-07 MED ORDER — AMISULPRIDE (ANTIEMETIC) 5 MG/2ML IV SOLN: 10.0000 mg | Freq: Once | INTRAVENOUS | Status: DC | PRN

## 2022-03-07 MED ORDER — OXYCODONE HCL 5 MG PO TABS: 5.0000 mg | ORAL_TABLET | Freq: Four times a day (QID) | ORAL | 0 refills | Status: AC | PRN

## 2022-03-07 MED ORDER — FENTANYL CITRATE (PF) 100 MCG/2ML IJ SOLN
INTRAMUSCULAR | Status: AC
  Filled 2022-03-07: qty 2

## 2022-03-07 MED ORDER — FENTANYL CITRATE (PF) 100 MCG/2ML IJ SOLN
INTRAMUSCULAR | Status: DC | PRN
  Administered 2022-03-07 (×2): 50 ug via INTRAVENOUS

## 2022-03-07 MED ORDER — LIDOCAINE HCL 1 % IJ SOLN
INTRAMUSCULAR | Status: DC | PRN
  Administered 2022-03-07: 20 mL

## 2022-03-07 MED ORDER — MIDAZOLAM HCL 2 MG/2ML IJ SOLN
INTRAMUSCULAR | Status: AC
  Filled 2022-03-07: qty 2

## 2022-03-07 MED ORDER — PROPOFOL 10 MG/ML IV BOLUS
INTRAVENOUS | Status: AC
  Filled 2022-03-07: qty 20

## 2022-03-07 MED ORDER — ACETAMINOPHEN 500 MG PO TABS
ORAL_TABLET | ORAL | Status: AC
  Filled 2022-03-07: qty 2

## 2022-03-07 MED ORDER — DEXAMETHASONE SODIUM PHOSPHATE 10 MG/ML IJ SOLN
INTRAMUSCULAR | Status: DC | PRN
  Administered 2022-03-07: 10 mg via INTRAVENOUS

## 2022-03-07 MED ORDER — LIDOCAINE HCL 1 % IJ SOLN: INTRAMUSCULAR | Status: DC | PRN

## 2022-03-07 MED ORDER — BACITRACIN ZINC 500 UNIT/GM EX OINT
TOPICAL_OINTMENT | CUTANEOUS | Status: AC
  Filled 2022-03-07: qty 28.35

## 2022-03-07 MED ORDER — PROPOFOL 10 MG/ML IV BOLUS
INTRAVENOUS | Status: DC | PRN
  Administered 2022-03-07: 200 mg via INTRAVENOUS

## 2022-03-07 MED ORDER — CEFAZOLIN IN SODIUM CHLORIDE 3-0.9 GM/100ML-% IV SOLN
INTRAVENOUS | Status: AC
Start: 2022-03-07 — End: ?
  Filled 2022-03-07: qty 100

## 2022-03-07 MED ORDER — ONDANSETRON HCL 4 MG/2ML IJ SOLN
INTRAMUSCULAR | Status: DC | PRN
  Administered 2022-03-07: 4 mg via INTRAVENOUS

## 2022-03-07 MED ORDER — ONDANSETRON HCL 4 MG PO TABS: 4.0000 mg | ORAL_TABLET | Freq: Three times a day (TID) | ORAL | 0 refills | Status: DC | PRN

## 2022-03-07 MED ORDER — OXYCODONE HCL 5 MG/5ML PO SOLN: 5.0000 mg | Freq: Once | ORAL | Status: AC | PRN

## 2022-03-07 MED ORDER — LIDOCAINE HCL (CARDIAC) PF 100 MG/5ML IV SOSY
PREFILLED_SYRINGE | INTRAVENOUS | Status: DC | PRN
  Administered 2022-03-07: 100 mg via INTRATRACHEAL

## 2022-03-07 SURGICAL SUPPLY — 86 items
APL SKNCLS STERI-STRIP NONHPOA (GAUZE/BANDAGES/DRESSINGS)
BALL CTTN LRG ABS STRL LF (GAUZE/BANDAGES/DRESSINGS)
BENZOIN TINCTURE PRP APPL 2/3 (GAUZE/BANDAGES/DRESSINGS) IMPLANT
BLADE CLIPPER SURG (BLADE) IMPLANT
BLADE DERMATOME SS (BLADE) ×1 IMPLANT
BLADE SURG 10 STRL SS (BLADE) ×1 IMPLANT
BLADE SURG 15 STRL LF DISP TIS (BLADE) ×1 IMPLANT
BLADE SURG 15 STRL SS (BLADE) ×2
BNDG COHESIVE 4X5 TAN ST LF (GAUZE/BANDAGES/DRESSINGS) ×2 IMPLANT
BNDG ELASTIC 3X5.8 VLCR STR LF (GAUZE/BANDAGES/DRESSINGS) IMPLANT
BNDG ELASTIC 4X5.8 VLCR STR LF (GAUZE/BANDAGES/DRESSINGS) IMPLANT
BNDG ELASTIC 6X5.8 VLCR STR LF (GAUZE/BANDAGES/DRESSINGS) IMPLANT
BNDG GAUZE DERMACEA FLUFF (GAUZE/BANDAGES/DRESSINGS) ×1
BNDG GAUZE DERMACEA FLUFF 4 (GAUZE/BANDAGES/DRESSINGS) ×1 IMPLANT
BNDG GZE DERMACEA 4 6PLY (GAUZE/BANDAGES/DRESSINGS) ×1
CANISTER SUCT 1200ML W/VALVE (MISCELLANEOUS) IMPLANT
COTTONBALL LRG STERILE PKG (GAUZE/BANDAGES/DRESSINGS) IMPLANT
COVER BACK TABLE 60X90IN (DRAPES) ×2 IMPLANT
COVER MAYO STAND STRL (DRAPES) IMPLANT
DEPRESSOR TONGUE BLADE STERILE (MISCELLANEOUS) ×1 IMPLANT
DERMACARRIERS GRAFT 1 TO 1.5 (DISPOSABLE) ×2
DRAPE INCISE 23X17 IOBAN STRL (DRAPES)
DRAPE INCISE 23X17 STRL (DRAPES) IMPLANT
DRAPE INCISE IOBAN 23X17 STRL (DRAPES) IMPLANT
DRAPE LAPAROTOMY 100X72 PEDS (DRAPES) IMPLANT
DRAPE SURG 17X23 STRL (DRAPES) IMPLANT
DRAPE U-SHAPE 76X120 STRL (DRAPES) ×1 IMPLANT
DRAPE UTILITY XL STRL (DRAPES) ×2 IMPLANT
DRSG ADAPTIC 3X8 NADH LF (GAUZE/BANDAGES/DRESSINGS) IMPLANT
DRSG CALCIUM ALGINATE 4X4 (GAUZE/BANDAGES/DRESSINGS) ×1 IMPLANT
DRSG EMULSION OIL 3X3 NADH (GAUZE/BANDAGES/DRESSINGS) IMPLANT
DRSG MEPITEL 4X7.2 (GAUZE/BANDAGES/DRESSINGS) IMPLANT
DRSG PAD ABDOMINAL 8X10 ST (GAUZE/BANDAGES/DRESSINGS) ×5 IMPLANT
ELECT COATED BLADE 2.86 ST (ELECTRODE) IMPLANT
ELECT NDL BLADE 2-5/6 (NEEDLE) IMPLANT
ELECT NEEDLE BLADE 2-5/6 (NEEDLE) IMPLANT
ELECT REM PT RETURN 9FT ADLT (ELECTROSURGICAL)
ELECTRODE REM PT RTRN 9FT ADLT (ELECTROSURGICAL) IMPLANT
GAUZE SPONGE 4X4 12PLY STRL (GAUZE/BANDAGES/DRESSINGS) IMPLANT
GAUZE SPONGE 4X4 12PLY STRL LF (GAUZE/BANDAGES/DRESSINGS) IMPLANT
GAUZE XEROFORM 1X8 LF (GAUZE/BANDAGES/DRESSINGS) IMPLANT
GAUZE XEROFORM 5X9 LF (GAUZE/BANDAGES/DRESSINGS) ×2 IMPLANT
GLOVE BIO SURGEON STRL SZ7.5 (GLOVE) ×2 IMPLANT
GLOVE BIOGEL M STRL SZ7.5 (GLOVE) ×2 IMPLANT
GLOVE BIOGEL PI IND STRL 8 (GLOVE) ×1 IMPLANT
GLOVE BIOGEL PI INDICATOR 8 (GLOVE) ×1
GOWN STRL REUS W/ TWL LRG LVL3 (GOWN DISPOSABLE) ×3 IMPLANT
GOWN STRL REUS W/TWL LRG LVL3 (GOWN DISPOSABLE) ×6
GRAFT DERMACARRIERS 1 TO 1.5 (DISPOSABLE) IMPLANT
HYDROGEN PEROXIDE 16OZ (MISCELLANEOUS) ×2 IMPLANT
NDL HYPO 25X1 1.5 SAFETY (NEEDLE) ×1 IMPLANT
NDL SPNL 18GX3.5 QUINCKE PK (NEEDLE) ×1 IMPLANT
NEEDLE HYPO 25X1 1.5 SAFETY (NEEDLE) ×2 IMPLANT
NEEDLE SPNL 18GX3.5 QUINCKE PK (NEEDLE) ×2 IMPLANT
NS IRRIG 1000ML POUR BTL (IV SOLUTION) ×1 IMPLANT
PACK BASIN DAY SURGERY FS (CUSTOM PROCEDURE TRAY) ×2 IMPLANT
PAD CAST 3X4 CTTN HI CHSV (CAST SUPPLIES) IMPLANT
PAD CAST 4YDX4 CTTN HI CHSV (CAST SUPPLIES) IMPLANT
PADDING CAST COTTON 3X4 STRL (CAST SUPPLIES)
PADDING CAST COTTON 4X4 STRL (CAST SUPPLIES)
PENCIL SMOKE EVACUATOR (MISCELLANEOUS) ×1 IMPLANT
SHEET MEDIUM DRAPE 40X70 STRL (DRAPES) IMPLANT
SPIKE FLUID TRANSFER (MISCELLANEOUS) IMPLANT
SPONGE T-LAP 18X18 ~~LOC~~+RFID (SPONGE) ×2 IMPLANT
STAPLER VISISTAT 35W (STAPLE) ×2 IMPLANT
STOCKINETTE 4X48 STRL (DRAPES) IMPLANT
STOCKINETTE 6  STRL (DRAPES) ×2
STOCKINETTE 6 STRL (DRAPES) IMPLANT
STOCKINETTE IMPERVIOUS LG (DRAPES) IMPLANT
STRIP CLOSURE SKIN 1/2X4 (GAUZE/BANDAGES/DRESSINGS) IMPLANT
SURGILUBE 2OZ TUBE FLIPTOP (MISCELLANEOUS) IMPLANT
SUT CHROMIC 4 0 PS 2 18 (SUTURE) IMPLANT
SUT CHROMIC 5 0 P 3 (SUTURE) IMPLANT
SUT ETHILON 3 0 PS 1 (SUTURE) ×4 IMPLANT
SUT MNCRL AB 4-0 PS2 18 (SUTURE) IMPLANT
SUT SILK 3 0 SH CR/8 (SUTURE) IMPLANT
SUT SILK 4 0 PS 2 (SUTURE) IMPLANT
SUT VIC AB 5-0 P-3 18X BRD (SUTURE) IMPLANT
SUT VIC AB 5-0 P3 18 (SUTURE)
SYR BULB EAR ULCER 3OZ GRN STR (SYRINGE) IMPLANT
SYR CONTROL 10ML LL (SYRINGE) ×2 IMPLANT
TOWEL GREEN STERILE FF (TOWEL DISPOSABLE) ×2 IMPLANT
TRAY DSU PREP LF (CUSTOM PROCEDURE TRAY) ×2 IMPLANT
TUBE CONNECTING 20X1/4 (TUBING) IMPLANT
UNDERPAD 30X36 HEAVY ABSORB (UNDERPADS AND DIAPERS) ×2 IMPLANT
YANKAUER SUCT BULB TIP NO VENT (SUCTIONS) IMPLANT

## 2022-03-07 NOTE — Transfer of Care (Signed)
Immediate Anesthesia Transfer of Care Note  Patient: Robert Brennan  Procedure(s) Performed: SKIN GRAFT SPLIT THICKNESS (Right: Leg Lower)  Patient Location: PACU  Anesthesia Type:General  Level of Consciousness: drowsy, patient cooperative and responds to stimulation  Airway & Oxygen Therapy: Patient Spontanous Breathing and Patient connected to face mask oxygen  Post-op Assessment: Report given to RN and Post -op Vital signs reviewed and stable  Post vital signs: Reviewed and stable  Last Vitals:  Vitals Value Taken Time  BP 142/101 03/07/22 1119  Temp    Pulse 109 03/07/22 1119  Resp 15 03/07/22 1119  SpO2 99 % 03/07/22 1119  Vitals shown include unvalidated device data.  Last Pain:  Vitals:   03/07/22 0834  TempSrc: Oral  PainSc: 2          Complications: No notable events documented.

## 2022-03-07 NOTE — Anesthesia Procedure Notes (Signed)
Procedure Name: LMA Insertion Date/Time: 03/07/2022 10:13 AM  Performed by: Thornell Mule, CRNAPre-anesthesia Checklist: Patient identified, Emergency Drugs available, Suction available and Patient being monitored Patient Re-evaluated:Patient Re-evaluated prior to induction Oxygen Delivery Method: Circle system utilized Preoxygenation: Pre-oxygenation with 100% oxygen Induction Type: IV induction LMA: LMA inserted LMA Size: 4.0 Number of attempts: 1 Placement Confirmation: positive ETCO2 Tube secured with: Tape Dental Injury: Teeth and Oropharynx as per pre-operative assessment

## 2022-03-07 NOTE — Anesthesia Postprocedure Evaluation (Signed)
Anesthesia Post Note  Patient: Robert Brennan  Procedure(s) Performed: SKIN GRAFT SPLIT THICKNESS (Right: Leg Lower)     Patient location during evaluation: PACU Anesthesia Type: General Level of consciousness: awake and alert Pain management: pain level controlled Vital Signs Assessment: post-procedure vital signs reviewed and stable Respiratory status: spontaneous breathing, nonlabored ventilation and respiratory function stable Cardiovascular status: blood pressure returned to baseline and stable Postop Assessment: no apparent nausea or vomiting Anesthetic complications: no   No notable events documented.  Last Vitals:  Vitals:   03/07/22 1200 03/07/22 1223  BP: 140/74 (!) 155/88  Pulse: 100 (!) 103  Resp: 11 20  Temp:  36.9 C  SpO2: 92% 94%    Last Pain:  Vitals:   03/07/22 1223  TempSrc:   PainSc: 4                  Lucretia Kern

## 2022-03-07 NOTE — Op Note (Signed)
Operative Note   DATE OF OPERATION: 03/07/2022  SURGICAL DEPARTMENT: Plastic Surgery  PREOPERATIVE DIAGNOSES:  Right leg wound  POSTOPERATIVE DIAGNOSES:  same  PROCEDURE:  1. Surgical preparation for coverage chronic right leg wound 6x7 cm 2. Split thickness skin graft right leg wound 6x7 cm  SURGEON: Ancil Linsey, MD  ASSISTANT: Zadie Cleverly, PA The advanced practice practitioner (APP) assisted throughout the case.  The APP was essential in retraction and counter traction when needed to make the case progress smoothly.  This retraction and assistance made it possible to see the tissue planes for the procedure.  The assistance was needed for hemostasis, tissue re-approximation and closure of the incision site.   ANESTHESIA:  General.   COMPLICATIONS: None.   INDICATIONS FOR PROCEDURE:  The patient, Robert Brennan is a 51 y.o. male born on 09-07-1971, is here for treatment of right leg wound MRN: [id number]  CONSENT:  Informed consent was obtained directly from the patient. Risks, benefits and alternatives were fully discussed. Specific risks including but not limited to bleeding, infection, hematoma, seroma, scarring, pain, contracture, asymmetry, wound healing problems, and need for further surgery were all discussed. The patient did have an ample opportunity to have questions answered to satisfaction.   DESCRIPTION OF PROCEDURE:  The patient was taken to the operating room. SCDs were placed and antibiotics were given. General anesthesia was administered.  The patient's operative site was prepped and draped in a sterile fashion. A time out was performed and all information was confirmed to be correct.  Started by evaluating the right lateral lower leg wound.  There was hypergranulation tissue at the base.  A 10 blade was used to shave down the hypergranulation tissue to a smooth flush surface.  There appeared to be healthy bleeding.  All devitalized tissue was removed.  The wound was  6x7 cm.  Local anesthetic was injected into right thigh.  A split thickness skin graft was harvested at 14/1000th of an inch with a dermatome.  It was meshed 1.5:1.  Graft was secured with staples and a bolster was fashioned with xeroform, scrub sponge, and 3-0 nylon.  Donor site was dressed with Melgisorb and tegaderm.  Soft wrap applied to the lower leg.  The patient tolerated the procedure well.  There were no complications. The patient was allowed to wake from anesthesia, extubated and taken to the recovery room in satisfactory condition.

## 2022-03-07 NOTE — Anesthesia Preprocedure Evaluation (Addendum)
Anesthesia Evaluation  Patient identified by MRN, date of birth, ID band Patient awake    Reviewed: Allergy & Precautions, NPO status , Patient's Chart, lab work & pertinent test results  History of Anesthesia Complications Negative for: history of anesthetic complications  Airway Mallampati: I  TM Distance: >3 FB Neck ROM: Full    Dental  (+) Dental Advisory Given, Teeth Intact   Pulmonary neg pulmonary ROS,    Pulmonary exam normal        Cardiovascular hypertension, Pt. on medications Normal cardiovascular exam     Neuro/Psych negative neurological ROS     GI/Hepatic negative GI ROS, Neg liver ROS,   Endo/Other  diabetes, Type 2, Oral Hypoglycemic Agents, Insulin Dependent  Renal/GU negative Renal ROS  negative genitourinary   Musculoskeletal  (+) Arthritis  (gout),   Abdominal   Peds  Hematology negative hematology ROS (+)   Anesthesia Other Findings   Reproductive/Obstetrics                            Anesthesia Physical Anesthesia Plan  ASA: 3  Anesthesia Plan: General   Post-op Pain Management: Tylenol PO (pre-op)* and Toradol IV (intra-op)*   Induction: Intravenous  PONV Risk Score and Plan: 2 and Ondansetron, Dexamethasone, Midazolam and Treatment may vary due to age or medical condition  Airway Management Planned: LMA  Additional Equipment: None  Intra-op Plan:   Post-operative Plan: Extubation in OR  Informed Consent: I have reviewed the patients History and Physical, chart, labs and discussed the procedure including the risks, benefits and alternatives for the proposed anesthesia with the patient or authorized representative who has indicated his/her understanding and acceptance.     Dental advisory given  Plan Discussed with:   Anesthesia Plan Comments:         Anesthesia Quick Evaluation

## 2022-03-07 NOTE — Interval H&P Note (Signed)
History and Physical Interval Note:  03/07/2022 9:18 AM  Robert Brennan  has presented today for surgery, with the diagnosis of Wound of right lower extremity.  The various methods of treatment have been discussed with the patient and family. After consideration of risks, benefits and other options for treatment, the patient has consented to  Procedure(s): SKIN GRAFT SPLIT THICKNESS (Right) as a surgical intervention.  The patient's history has been reviewed, patient examined, no change in status, stable for surgery.  I have reviewed the patient's chart and labs.  Questions were answered to the patient's satisfaction.     Allena Napoleon

## 2022-03-07 NOTE — Discharge Instructions (Addendum)
Activity as tolerated. Do not get the right leg wet. NO driving No heavy activities  Diet: Regular, Try to optimize nutrition with plenty of fruits and vegetables to improve healing. Wound Care: Keep dressing clean & dry. Leave dressing in place on your lower leg. Keep wrapped. Reapply dressing to right thigh as needed, there is usually a fair amount of drainage, which is normal.  Call doctor if any unusual problems occur such as pain, excessive bleeding, unrelieved nausea/vomiting, fever &/or chills  Follow-up appointment: Previously scheduled. Medications: Oxycodone and Zofran called in today to your pharmacy.  [facility] [address], [place] - [address]  [address] [place] [address]    No tylenol until 230 pm   Post Anesthesia Home Care Instructions  Activity: Get plenty of rest for the remainder of the day. A responsible individual must stay with you for 24 hours following the procedure.  For the next 24 hours, DO NOT: -Drive a car -Advertising copywriter -Drink alcoholic beverages -Take any medication unless instructed by your physician -Make any legal decisions or sign important papers.  Meals: Start with liquid foods such as gelatin or soup. Progress to regular foods as tolerated. Avoid greasy, spicy, heavy foods. If nausea and/or vomiting occur, drink only clear liquids until the nausea and/or vomiting subsides. Call your physician if vomiting continues.  Special Instructions/Symptoms: Your throat may feel dry or sore from the anesthesia or the breathing tube placed in your throat during surgery. If this causes discomfort, gargle with warm salt water. The discomfort should disappear within 24 hours.  If you had a scopolamine patch placed behind your ear for the management of post- operative nausea and/or vomiting:  1. The medication in the patch is effective for 72 hours, after which it should be removed.  Wrap patch in a tissue and discard in the  trash. Wash hands thoroughly with soap and water. 2. You may remove the patch earlier than 72 hours if you experience unpleasant side effects which may include dry mouth, dizziness or visual disturbances. 3. Avoid touching the patch. Wash your hands with soap and water after contact with the patch.

## 2022-03-08 ENCOUNTER — Encounter (HOSPITAL_BASED_OUTPATIENT_CLINIC_OR_DEPARTMENT_OTHER): Payer: Self-pay | Admitting: Plastic Surgery

## 2022-03-10 ENCOUNTER — Telehealth: Payer: Self-pay | Admitting: *Deleted

## 2022-03-10 NOTE — Telephone Encounter (Signed)
Pt's wife Deanna Artis called stating pt's donor site dressing was leaking on one side and the tape was loosening and she wondered if she should change it. Advised to leave dressing in place and reinforce with gauze if needed. Also to call office with any further concerns. Verbalized understanding.

## 2022-03-16 ENCOUNTER — Encounter: Payer: Self-pay | Admitting: Physician Assistant

## 2022-03-16 ENCOUNTER — Ambulatory Visit (INDEPENDENT_AMBULATORY_CARE_PROVIDER_SITE_OTHER): Payer: BC Managed Care – PPO | Admitting: Physician Assistant

## 2022-03-16 DIAGNOSIS — S81801D Unspecified open wound, right lower leg, subsequent encounter: Secondary | ICD-10-CM

## 2022-03-16 DIAGNOSIS — Z945 Skin transplant status: Secondary | ICD-10-CM

## 2022-03-17 ENCOUNTER — Telehealth: Payer: Self-pay | Admitting: *Deleted

## 2022-03-30 ENCOUNTER — Encounter: Payer: Self-pay | Admitting: Surgical

## 2022-03-30 ENCOUNTER — Encounter: Payer: BC Managed Care – PPO | Admitting: Surgical

## 2022-03-30 ENCOUNTER — Ambulatory Visit (INDEPENDENT_AMBULATORY_CARE_PROVIDER_SITE_OTHER): Payer: BC Managed Care – PPO | Admitting: Surgical

## 2022-03-30 DIAGNOSIS — S81801D Unspecified open wound, right lower leg, subsequent encounter: Secondary | ICD-10-CM

## 2022-03-30 DIAGNOSIS — Z945 Skin transplant status: Secondary | ICD-10-CM

## 2022-03-30 NOTE — Progress Notes (Signed)
Patient is a 51 year old male here for follow-up after split-thickness skin graft to his right ankle wound with Dr. Arita Miss on 03/07/2022.  He is just over 3 weeks postop.  He presents today with his wife.  He reports he is overall doing well, has been doing dressing changes as instructed.  He is not having any infectious symptoms.  Denies any lower extremity swelling, chest pain, shortness of breath, fevers or chills.  On exam right lower extremity wound with split-thickness skin graft in place, well adherent, appears to have had 90% take.  There is some granulation tissue noted along the inferior border.  There is some maceration noted as well at the wound edges and the inferior skin bridge between his lower extremity wounds.  Proximal right thigh split-thickness skin graft donor site is healing well.  Beginning to establish new pigmentation.  He has a posterior distal calf/proximal ankle wound that is approximately 3 x 3 mm.  No surrounding erythema.  Inferior to the skin grafted wound he has an additional wound that is approximately 1 x 1 cm.  There is some fibrinous exudate noted within the wound bed   Assessment/plan:  Recommend continue with Xeroform dressing changes to right lower extremity wounds daily.  We did discuss that he has some maceration, recommend decreasing amount of Vaseline and being sure to cut the Xeroform to size as the increased maceration and moisture may cause additional wounds.  Patient and his wife are understanding of this.  His right lower extremity staples were removed, patient tolerated this well.  There is no signs of infection on exam.  We discussed referral to wound care for management of the additional wounds of his right lower extremity that were not surgically managed.  Patient is in agreement with this.  We will send referral today.  We continue to remain available for any need for surgical intervention.  Recommend calling with questions or concerns.  We will plan  to see patient back in 3 weeks for reevaluation of the split-thickness skin graft donor and recipient site.  Pictures were obtained of the patient and placed in the chart with the patient's or guardian's permission.

## 2022-04-10 ENCOUNTER — Encounter (HOSPITAL_BASED_OUTPATIENT_CLINIC_OR_DEPARTMENT_OTHER): Payer: BC Managed Care – PPO | Attending: General Surgery | Admitting: General Surgery

## 2022-04-10 DIAGNOSIS — E11622 Type 2 diabetes mellitus with other skin ulcer: Secondary | ICD-10-CM | POA: Diagnosis not present

## 2022-04-10 DIAGNOSIS — I890 Lymphedema, not elsewhere classified: Secondary | ICD-10-CM | POA: Insufficient documentation

## 2022-04-10 DIAGNOSIS — L97812 Non-pressure chronic ulcer of 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 exposed: Secondary | ICD-10-CM | POA: Insufficient documentation

## 2022-04-11 NOTE — Progress Notes (Signed)
MARKET, Costas ([id number]) Visit Report for 04/10/2022 Allergy List Details Patient Name: Date of Service: Robert Brennan, Robert Brennan 04/10/2022 9:00 A M Medical Record Number: [id number] Patient Account Number: [id number] Date of Birth/Sex: Treating RN: [birth year] (51 y.o. [name] Primary Care Aleksa Collinsworth: MA Durenda Guthrie, [place] TTHEW Other Clinician: Referring Xzayvion Vaeth: Treating Buffie Herne/Extender: Randell Patient in Treatment: 0 Allergies Active Allergies No Known Allergies Allergy Notes Electronic Signature(s) Signed: 04/11/2022 5:31:58 PM By: Baruch Gouty RN, BSN Entered By: Baruch Gouty on 04/10/2022 09:13:30 -------------------------------------------------------------------------------- Arrival Information Details Patient Name: Date of Service: Robert Brennan, Robert Brennan 04/10/2022 9:00 A M Medical Record Number: [id number] Patient Account Number: [id number] Date of Birth/Sex: Treating RN: [birth year] (51 y.o. [name] Primary Care Jalene Demo: MA Durenda G[place], [place] TTHEW Other Clinician: Referring Giannie Soliday: Treating Siani Utke/Extender: Randell Patient in Treatment: 0 Visit Information Patient Arrived: Ambulatory Arrival Time: 09:06 Accompanied By: self Transfer Assistance: None Patient Identification Verified: Yes Secondary Verification Process Completed: Yes Patient Requires Transmission-Based Precautions: No Patient Has Alerts: No Electronic Signature(s) Signed: 04/11/2022 5:31:58 PM By: Baruch Gouty RN, BSN Entered By: Baruch Gouty on 04/10/2022 09:09:13 -------------------------------------------------------------------------------- Clinic Level of Care Assessment Details Patient Name: Date of Service: [name], Robert Brennan 04/10/2022 9:00 A M Medical Record Number: [id number] Patient Account Number: [id number] Date of Birth/Sex: Treating RN: Nov 17, 1970 (51 y.o. [name] Primary Care Ashauna Bertholf: MA Durenda G[place], [place] TTHEW Other Clinician: Referring  Lacy Taglieri: Treating Jaymee Tilson/Extender: Randell Patient in Treatment: 0 Clinic Level of Care Assessment Items TOOL 1 Quantity Score []  - 0 Use when EandM and Procedure is performed on INITIAL visit ASSESSMENTS - Nursing Assessment / Reassessment X- 1 20 General Physical Exam (combine w/ comprehensive assessment (listed just below) when performed on new pt. evals) X- 1 25 Comprehensive Assessment (HX, ROS, Risk Assessments, Wounds Hx, etc.) ASSESSMENTS - Wound and Skin Assessment / Reassessment []  - 0 Dermatologic / Skin Assessment (not related to wound area) ASSESSMENTS - Ostomy and/or Continence Assessment and Care []  - 0 Incontinence Assessment and Management []  - 0 Ostomy Care Assessment and Management (repouching, etc.) PROCESS - Coordination of Care X - Simple Patient / Family Education for ongoing care 1 15 []  - 0 Complex (extensive) Patient / Family Education for ongoing care X- 1 10 Staff obtains Programmer, systems, Records, T Results / Process Orders est []  - 0 Staff telephones HHA, Nursing Homes / Clarify orders / etc []  - 0 Routine Transfer to another Facility (non-emergent condition) []  - 0 Routine Hospital Admission (non-emergent condition) X- 1 15 New Admissions / Biomedical engineer / Ordering NPWT Apligraf, etc. , []  - 0 Emergency Hospital Admission (emergent condition) PROCESS - Special Needs []  - 0 Pediatric / Minor Patient Management []  - 0 Isolation Patient Management []  - 0 Hearing / Language / Visual special needs []  - 0 Assessment of Community assistance (transportation, D/C planning, etc.) []  - 0 Additional assistance / Altered mentation []  - 0 Support Surface(s) Assessment (bed, cushion, seat, etc.) INTERVENTIONS - Miscellaneous []  - 0 External ear exam []  - 0 Patient Transfer (multiple staff / Civil Service fast streamer / Similar devices) []  - 0 Simple Staple / Suture removal (25 or less) []  - 0 Complex Staple / Suture removal  (26 or more) []  - 0 Hypo/Hyperglycemic Management (do not check if billed separately) X- 1 15 Ankle / Brachial Index (ABI) - do not check if billed separately Has the patient been seen at the hospital within the last three years: Yes Total  Score: 100 Level Of Care: New/Established - Level 3 Electronic Signature(s) Signed: 04/11/2022 5:31:58 PM By: Baruch Gouty RN, BSN Entered By: Baruch Gouty on 04/11/2022 08:12:59 -------------------------------------------------------------------------------- Compression Therapy Details Patient Name: Date of Service: Robert Brennan, Robert Brennan 04/10/2022 9:00 A M Medical Record Number: [id number] Patient Account Number: [id number] Date of Birth/Sex: Treating RN: [birth year] (51 y.o. [name] Primary Care Lashannon Bresnan: MA Durenda G[place], [place] TTHEW Other Clinician: Referring Emiliano Welshans: Treating Austine Kelsay/Extender: Randell Patient in Treatment: 0 Compression Therapy Performed for Wound Assessment: Wound #1 Right,Lateral Lower Leg Performed By: Clinician Baruch Gouty, RN Compression Type: Three Layer Post Procedure Diagnosis Same as Pre-procedure Electronic Signature(s) Signed: 04/11/2022 5:31:58 PM By: Baruch Gouty RN, BSN Entered By: Baruch Gouty on 04/10/2022 10:18:47 -------------------------------------------------------------------------------- Encounter Discharge Information Details Patient Name: Date of Service: Robert Brennan, Robert Brennan 04/10/2022 9:00 A M Medical Record Number: [id number] Patient Account Number: [id number] Date of Birth/Sex: Treating RN: August 05, 1971 (51 y.o. [name] Primary Care Jeyli Zwicker: MA Durenda G[place], [place] TTHEW Other Clinician: Referring Huda Petrey: Treating Kathryne Ramella/Extender: Randell Patient in Treatment: 0 Encounter Discharge Information Items Post Procedure Vitals Discharge Condition: Stable Temperature (F): 98.7 Ambulatory Status: Ambulatory Pulse (bpm): 101 Discharge  Destination: Home Respiratory Rate (breaths/min): 18 Transportation: Private Auto Blood Pressure (mmHg): 138/87 Accompanied By: self Schedule Follow-up Appointment: Yes Clinical Summary of Care: Patient Declined Electronic Signature(s) Signed: 04/11/2022 5:31:58 PM By: Baruch Gouty RN, BSN Entered By: Baruch Gouty on 04/11/2022 08:14:22 -------------------------------------------------------------------------------- Lower Extremity Assessment Details Patient Name: Date of Service: Robert Brennan, Jhonatan 04/10/2022 9:00 A M Medical Record Number: [id number] Patient Account Number: [id number] Date of Birth/Sex: Treating RN: [birth year] (51 y.o. [name] Primary Care Jevan Gaunt: MA Durenda G[place], [place] TTHEW Other Clinician: Referring Shontez Sermon: Treating Latondra Gebhart/Extender: Randell Patient in Treatment: 0 Edema Assessment Assessed: [Left: No] [Right: No] Edema: [Left: Ye] [Right: s] Calf Left: Right: Point of Measurement: From Medial Instep 42 cm Ankle Left: Right: Point of Measurement: From Medial Instep 26 cm Vascular Assessment Pulses: Dorsalis Pedis Palpable: [Right:Yes] Blood Pressure: Brachial: [Right:138] Ankle: [Right:Posterior Tibial: 200 1.45] Notes DP pulse noncompressible Electronic Signature(s) Signed: 04/11/2022 5:31:58 PM By: Baruch Gouty RN, BSN Entered By: Baruch Gouty on 04/10/2022 09:44:50 -------------------------------------------------------------------------------- Multi Wound Chart Details Patient Name: Date of Service: Robert Brennan, [place] 04/10/2022 9:00 A M Medical Record Number: [id number] Patient Account Number: [id number] Date of Birth/Sex: Treating RN: Nov 04, 1970 (51 y.o. [name] Primary Care Rubel Heckard: MA Durenda G[place], [place] TTHEW Other Clinician: Referring Chalese Peach: Treating Kymber Kosar/Extender: Randell Patient in Treatment: 0 Vital Signs Height(in): 72 Pulse(bpm): 101 Weight(lbs): 280 Blood  Pressure(mmHg): 138/87 Body Mass Index(BMI): 38 Temperature(F): 98.7 Respiratory Rate(breaths/min): 18 Photos: Right, Lateral Lower Leg Right, Distal, Lateral Lower Leg Right, Posterior Lower Leg Wound Location: Trauma Trauma Gradually Appeared Wounding Event: Diabetic Wound/Ulcer of the Lower Diabetic Wound/Ulcer of the Lower Diabetic Wound/Ulcer of the Lower Primary Etiology: Extremity Extremity 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 Secondary Etiology: Hypertension, Type II Diabetes, Gout, Hypertension, Type II Diabetes, Gout, Hypertension, Type II Diabetes, Gout, Comorbid History: Neuropathy Neuropathy Neuropathy 07/24/2021 09/26/2021 07/24/2021 Date Acquired: 0 0 0 Weeks of Treatment: Open Open Open Wound Status: No No No Wound Recurrence: 3x2.6x0.1 1.9x1.9x0.1 0.4x0.5x0.1 Measurements L x W x D (cm) 6.126 2.835 0.157 A (cm) : rea 0.613 0.284 0.016 Volume (cm) : 0.00% 0.00% 0.00% % Reduction in A rea: 0.00% 0.00% 0.00% % Reduction in Volume: Grade 2 Grade 1 Grade 1 Classification:  Medium Medium Medium Exudate A mount: Serosanguineous Serosanguineous Serosanguineous Exudate Type: red, brown red, brown red, brown Exudate Color: Flat and Intact Flat and Intact Flat and Intact Wound Margin: Large (67-100%) Medium (34-66%) Large (67-100%) Granulation A mount: Red Red Red Granulation Quality: Small (1-33%) Medium (34-66%) None Present (0%) Necrotic A mount: Fat Layer (Subcutaneous Tissue): Yes Fat Layer (Subcutaneous Tissue): Yes Fat Layer (Subcutaneous Tissue): Yes Exposed Structures: Fascia: No Fascia: No Fascia: No Tendon: No Tendon: No Tendon: No Muscle: No Muscle: No Muscle: No Joint: No Joint: No Joint: No Bone: No Bone: No Bone: No Small (1-33%) Small (1-33%) Large (67-100%) Epithelialization: Debridement - Excisional Debridement - Excisional Debridement - Excisional Debridement: Pre-procedure Verification/Time Out  10:10 10:10 10:10 Taken: Lidocaine 4% Topical Solution Lidocaine 4% Topical Solution Lidocaine 4% Topical Solution Pain Control: Subcutaneous, Slough Subcutaneous, Slough Subcutaneous, Slough Tissue Debrided: Skin/Subcutaneous Tissue Skin/Subcutaneous Tissue Skin/Subcutaneous Tissue Level: 7.8 3.61 0.2 Debridement A (sq cm): rea Curette Curette Curette Instrument: Minimum Minimum Minimum Bleeding: Pressure Pressure Pressure Hemostasis A chieved: 0 0 0 Procedural Pain: 0 0 0 Post Procedural Pain: Procedure was tolerated well Procedure was tolerated well Procedure was tolerated well Debridement Treatment Response: 3x2.6x0.1 1.9x1.9x0.1 0.4x0.5x0.1 Post Debridement Measurements L x W x D (cm) 0.613 0.284 0.016 Post Debridement Volume: (cm) Compression Therapy Debridement Debridement Procedures Performed: Debridement Treatment Notes Electronic Signature(s) Signed: 04/10/2022 10:29:35 AM By: Fredirick Maudlin MD FACS Signed: 04/11/2022 5:31:58 PM By: Baruch Gouty RN, BSN Entered By: Fredirick Maudlin on 04/10/2022 10:29:35 -------------------------------------------------------------------------------- Multi-Disciplinary Care Plan Details Patient Name: Date of Service: Robert Brennan, [place]ju[place] 04/10/2022 9:00 A M Medical Record Number: [id number] Patient Account Number: [id number] Date of Birth/Sex: Treating RN: Mar 25, 1971 (51 y.o. [name] Primary Care Imberly Troxler: MA Durenda G[place], [place] TTHEW Other Clinician: Referring Cyler Kappes: Treating Mordecai Tindol/Extender: Randell Patient in Treatment: 0 Multidisciplinary Care Plan reviewed with physician Active Inactive Nutrition Nursing Diagnoses: Impaired glucose control: actual or potential Potential for alteratiion in Nutrition/Potential for imbalanced nutrition Goals: Patient/caregiver will maintain therapeutic glucose control Date Initiated: 04/11/2022 Target Resolution Date: 05/09/2022 Goal Status:  Active Interventions: Assess HgA1c results as ordered upon admission and as needed Assess patient nutrition upon admission and as needed per policy Treatment Activities: Dietary management education, guidance and counseling : 04/10/2022 Giving encouragement to exercise : 04/10/2022 Patient referred to Primary Care Physician for further nutritional evaluation : 04/10/2022 Notes: Venous Leg Ulcer Nursing Diagnoses: Knowledge deficit related to disease process and management Potential for venous Insuffiency (use before diagnosis confirmed) Goals: Patient will maintain optimal edema control Date Initiated: 04/11/2022 Target Resolution Date: 05/09/2022 Goal Status: Active Interventions: Assess peripheral edema status every visit. Compression as ordered Provide education on venous insufficiency Treatment Activities: Therapeutic compression applied : 04/10/2022 Notes: Wound/Skin Impairment Nursing Diagnoses: Impaired tissue integrity Knowledge deficit related to ulceration/compromised skin integrity Goals: Patient/caregiver will verbalize understanding of skin care regimen Date Initiated: 04/11/2022 Target Resolution Date: 05/09/2022 Goal Status: Active Ulcer/skin breakdown will have a volume reduction of 30% by week 4 Date Initiated: 04/11/2022 Target Resolution Date: 05/09/2022 Goal Status: Active Interventions: Assess patient/caregiver ability to obtain necessary supplies Assess patient/caregiver ability to perform ulcer/skin care regimen upon admission and as needed Assess ulceration(s) every visit Provide education on ulcer and skin care Treatment Activities: Skin care regimen initiated : 04/10/2022 Topical wound management initiated : 04/10/2022 Notes: Electronic Signature(s) Signed: 04/11/2022 5:31:58 PM By: Baruch Gouty RN, BSN Entered By: Baruch Gouty on 04/11/2022 08:12:37 -------------------------------------------------------------------------------- Pain Assessment  Details Patient Name: Date of Service:  BA NKS, [place] 04/10/2022 9:00 A M Medical Record Number: [id number] Patient Account Number: [id number] Date of Birth/Sex: Treating RN: [birth year] (51 y.o. [name] Primary Care Graciana Sessa: MA Phillis Haggis, [place] TTHEW Other Clinician: Referring Leiana Rund: Treating Marv Alfrey/Extender: Laurann Montana in Treatment: 0 Active Problems Location of Pain Severity and Description of Pain Patient Has Paino No Site Locations Rate the pain. Current Pain Level: 0 Pain Management and Medication Current Pain Management: Electronic Signature(s) Signed: 04/11/2022 5:31:58 PM By: Zenaida Deed RN, BSN Entered By: Zenaida Deed on 04/10/2022 09:55:34 -------------------------------------------------------------------------------- Patient/Caregiver Education Details Patient Name: Date of Service: [name], [place] 7/17/2023andnbsp9:00 A M Medical Record Number: [id number] Patient Account Number: [id number] Date of Birth/Gender: Treating RN: 1971/03/26 (51 y.o. [name] Primary Care Physician: MA Phillis Haggis, [place] TTHEW Other Clinician: Referring Physician: Treating Physician/Extender: Laurann Montana in Treatment: 0 Education Assessment Education Provided To: Patient Education Topics Provided Venous: Methods: Explain/Verbal Responses: Reinforcements needed, State content correctly [facility]: o Handouts: Welcome [facility] o Methods: Explain/Verbal, Printed Responses: Reinforcements needed, State content correctly Wound/Skin Impairment: Handouts: Caring for Your Ulcer, Skin Care Do's and Dont's Methods: Explain/Verbal, Printed Responses: Reinforcements needed, State content correctly Electronic Signature(s) Signed: 04/11/2022 5:31:58 PM By: Zenaida Deed RN, BSN Entered By: Zenaida Deed on 04/10/2022  10:03:31 -------------------------------------------------------------------------------- Wound Assessment Details Patient Name: Date of Service: [name], [place] 04/10/2022 9:00 A M Medical Record Number: [id number] Patient Account Number: [id number] Date of Birth/Sex: Treating RN: 1971-01-03 (51 y.o. [name] Primary Care Imojean Yoshino: MA Phillis Hagg[place], [place] TTHEW Other Clinician: Referring Abisai Coble: Treating Jaevian Shean/Extender: Laurann Montana in Treatment: 0 Wound Status Wound Number: 1 Primary Etiology: Diabetic Wound/Ulcer of the Lower Extremity Wound Location: Right, Lateral Lower Leg Secondary Etiology: Venous Leg Ulcer Wounding Event: Trauma Wound Status: Open Date Acquired: 07/24/2021 Comorbid History: Hypertension, Type II Diabetes, Gout, Neuropathy Weeks Of Treatment: 0 Clustered Wound: No Photos Wound Measurements Length: (cm) 3 Width: (cm) 2.6 Depth: (cm) 0.1 Area: (cm) 6.126 Volume: (cm) 0.613 % Reduction in Area: 0% % Reduction in Volume: 0% Epithelialization: Small (1-33%) Tunneling: No Undermining: No Wound Description Classification: Grade 2 Wound Margin: Flat and Intact Exudate Amount: Medium Exudate Type: Serosanguineous Exudate Color: red, brown Foul Odor After Cleansing: No Slough/Fibrino Yes Wound Bed Granulation Amount: Large (67-100%) Exposed Structure Granulation Quality: Red Fascia Exposed: No Necrotic Amount: Small (1-33%) Fat Layer (Subcutaneous Tissue) Exposed: Yes Necrotic Quality: Adherent Slough Tendon Exposed: No Muscle Exposed: No Joint Exposed: No Bone Exposed: No Treatment Notes Wound #1 (Lower Leg) Wound Laterality: Right, Lateral Cleanser Peri-Wound Care Sween Lotion (Moisturizing lotion) Discharge Instruction: Apply moisturizing lotion as directed Topical Primary Dressing Promogran Prisma Matrix, 4.34 (sq in) (silver collagen) Discharge Instruction: Moisten collagen with saline or  hydrogel Secondary Dressing Woven Gauze Sponge, Non-Sterile 4x4 in Discharge Instruction: Apply over primary dressing as directed. Secured With Compression Wrap ThreePress (3 layer compression wrap) Discharge Instruction: Apply three layer compression as directed. Compression Stockings Add-Ons Electronic Signature(s) Signed: 04/11/2022 5:31:58 PM By: Zenaida Deed RN, BSN Entered By: Zenaida Deed on 04/10/2022 09:57:19 -------------------------------------------------------------------------------- Wound Assessment Details Patient Name: Date of Service: [name], [place] 04/10/2022 9:00 A M Medical Record Number: [id number] Patient Account Number: [id number] Date of Birth/Sex: Treating RN: 03/31/1971 (51 y.o. [name] Primary Care Lilian Fuhs: MA Phillis Haggis, [place] TTHEW Other Clinician: Referring Lakasha Mcfall: Treating Shemar Plemmons/Extender: Laurann Montana in Treatment: 0 Wound Status Wound Number:  2 Primary Etiology: Diabetic Wound/Ulcer of the Lower Extremity Wound Location: Right, Distal, Lateral Lower Leg Secondary Etiology: Venous Leg Ulcer Wounding Event: Trauma Wound Status: Open Date Acquired: 09/26/2021 Comorbid History: Hypertension, Type II Diabetes, Gout, Neuropathy Weeks Of Treatment: 0 Clustered Wound: No Photos Wound Measurements Length: (cm) 1.9 Width: (cm) 1.9 Depth: (cm) 0.1 Area: (cm) 2.835 Volume: (cm) 0.284 % Reduction in Area: 0% % Reduction in Volume: 0% Epithelialization: Small (1-33%) Tunneling: No Undermining: No Wound Description Classification: Grade 1 Wound Margin: Flat and Intact Exudate Amount: Medium Exudate Type: Serosanguineous Exudate Color: red, brown Foul Odor After Cleansing: No Slough/Fibrino Yes Wound Bed Granulation Amount: Medium (34-66%) Exposed Structure Granulation Quality: Red Fascia Exposed: No Necrotic Amount: Medium (34-66%) Fat Layer (Subcutaneous Tissue) Exposed: Yes Necrotic Quality:  Adherent Slough Tendon Exposed: No Muscle Exposed: No Joint Exposed: No Bone Exposed: No Treatment Notes Wound #2 (Lower Leg) Wound Laterality: Right, Lateral, Distal Cleanser Peri-Wound Care Sween Lotion (Moisturizing lotion) Discharge Instruction: Apply moisturizing lotion as directed Topical Primary Dressing Promogran Prisma Matrix, 4.34 (sq in) (silver collagen) Discharge Instruction: Moisten collagen with saline or hydrogel Secondary Dressing Woven Gauze Sponge, Non-Sterile 4x4 in Discharge Instruction: Apply over primary dressing as directed. Secured With Compression Wrap ThreePress (3 layer compression wrap) Discharge Instruction: Apply three layer compression as directed. Compression Stockings Add-Ons Electronic Signature(s) Signed: 04/11/2022 5:31:58 PM By: Baruch Gouty RN, BSN Entered By: Baruch Gouty on 04/10/2022 09:58:43 -------------------------------------------------------------------------------- Wound Assessment Details Patient Name: Date of Service: Robert Brennan, [place] 04/10/2022 9:00 A M Medical Record Number: [id number] Patient Account Number: [id number] Date of Birth/Sex: Treating RN: [birth year] (51 y.o. [name] Primary Care Vivion Romano: MA Durenda G[place], [place] TTHEW Other Clinician: Referring Jeronica Stlouis: Treating Gabriella Guile/Extender: Randell Patient in Treatment: 0 Wound Status Wound Number: 3 Primary Etiology: Diabetic Wound/Ulcer of the Lower Extremity Wound Location: Right, Posterior Lower Leg Secondary Etiology: Venous Leg Ulcer Wounding Event: Gradually Appeared Wound Status: Open Date Acquired: 07/24/2021 Comorbid History: Hypertension, Type II Diabetes, Gout, Neuropathy Weeks Of Treatment: 0 Clustered Wound: No Photos Wound Measurements Length: (cm) 0.4 Width: (cm) 0.5 Depth: (cm) 0.1 Area: (cm) 0.157 Volume: (cm) 0.016 % Reduction in Area: 0% % Reduction in Volume: 0% Epithelialization: Large  (67-100%) Tunneling: No Undermining: No Wound Description Classification: Grade 1 Wound Margin: Flat and Intact Exudate Amount: Medium Exudate Type: Serosanguineous Exudate Color: red, brown Foul Odor After Cleansing: No Slough/Fibrino No Wound Bed Granulation Amount: Large (67-100%) Exposed Structure Granulation Quality: Red Fascia Exposed: No Necrotic Amount: None Present (0%) Fat Layer (Subcutaneous Tissue) Exposed: Yes Tendon Exposed: No Muscle Exposed: No Joint Exposed: No Bone Exposed: No Treatment Notes Wound #3 (Lower Leg) Wound Laterality: Right, Posterior Cleanser Peri-Wound Care Sween Lotion (Moisturizing lotion) Discharge Instruction: Apply moisturizing lotion as directed Topical Primary Dressing Promogran Prisma Matrix, 4.34 (sq in) (silver collagen) Discharge Instruction: Moisten collagen with saline or hydrogel Secondary Dressing Woven Gauze Sponge, Non-Sterile 4x4 in Discharge Instruction: Apply over primary dressing as directed. Secured With Compression Wrap ThreePress (3 layer compression wrap) Discharge Instruction: Apply three layer compression as directed. Compression Stockings Add-Ons Electronic Signature(s) Signed: 04/11/2022 5:31:58 PM By: Baruch Gouty RN, BSN Entered By: Baruch Gouty on 04/10/2022 09:59:21 -------------------------------------------------------------------------------- [place] Details Patient Name: Date of Service: Robert Brennan, [place] 04/10/2022 9:00 A M Medical Record Number: [id number] Patient Account Number: [id number] Date of Birth/Sex: Treating RN: 07-Oct-1970 (51 y.o. [name] Primary Care Lurene Robley: MA Durenda Guthrie, [place] TTHEW Other Clinician: Referring Terius Jacuinde: Treating Isahia Hollerbach/Extender: Marcia Brash,  Neoma Laming in Treatment: 0 Vital Signs Time Taken: 09:09 Temperature (F): 98.7 Height (in): 72 Pulse (bpm): 101 Source: Stated Respiratory Rate (breaths/min): 18 Weight (lbs): 280 Blood  Pressure (mmHg): 138/87 Source: Stated Reference Range: 80 - 120 mg / dl Body Mass Index (BMI): 38 Electronic Signature(s) Signed: 04/11/2022 5:31:58 PM By: Zenaida Deed RN, BSN Entered By: Zenaida Deed on 04/10/2022 09:10:35

## 2022-04-11 NOTE — Progress Notes (Signed)
Brennan, Robert ([id number]) Visit Report for 04/10/2022 Abuse Risk Screen Details Patient Name: Date of Service: Robert Brennan, Robert Brennan 04/10/2022 9:00 A M Medical Record Number: [id number] Patient Account Number: [id number] Date of Birth/Sex: Treating RN: 06-Mar-1971 (51 y.o. [name] Primary Care Marvie Calender: MA Phillis [place]agg[place], [place] TTHEW Other Clinician: Referring Shaylene Paganelli: Treating Kaileah Shevchenko/Extender: Laurann Montana in Treatment: 0 Abuse Risk Screen Items Answer ABUSE RISK SCREEN: Has anyone close to you tried to hurt or harm you recentlyo No Do you feel uncomfortable with anyone in your familyo No Has anyone forced you do things that you didnt want to doo No Electronic Signature(s) Signed: 04/11/2022 5:31:58 PM By: Zenaida Deed RN, BSN Entered By: Zenaida Deed on 04/10/2022 09:30:45 -------------------------------------------------------------------------------- Activities of Daily Living Details Patient Name: Date of Service: Robert Brennan, Robert Brennan 04/10/2022 9:00 A M Medical Record Number: [id number] Patient Account Number: [id number] Date of Birth/Sex: Treating RN: May 19, 1971 (51 y.o. [name] Primary Care Floriene Jeschke: MA Phill[place], [place] TTHEW Other Clinician: Referring Abeera Flannery: Treating Jaimey Franchini/Extender: Laurann Montana in Treatment: 0 Activities of Daily Living Items Answer Activities of Daily Living (Please select one for each item) Drive Automobile Completely Able T Medications ake Completely Able Use T elephone Completely Able Care for Appearance Completely Able Use T oilet Completely Able Bath / Shower Completely Able Dress Self Completely Able Feed Self Completely Able Walk Completely Able Get In / Out Bed Completely Able Housework Completely Able Prepare Meals Completely Able Handle Money Completely Able Shop for Self Completely Able Electronic Signature(s) Signed: 04/11/2022 5:31:58 PM By: Zenaida Deed RN, BSN Entered By:  Zenaida Deed on 04/10/2022 09:31:09 -------------------------------------------------------------------------------- Education Screening Details Patient Name: Date of Service: Robert Brennan, Robert Brennan 04/10/2022 9:00 A M Medical Record Number: [id number] Patient Account Number: [id number] Date of Birth/Sex: Treating RN: [birth year] (51 y.o. [name] Primary Care Shimon Trowbridge: MA Phillis [place], [place] TTHEW Other Clinician: Referring Kania Regnier: Treating Cashus Halterman/Extender: Laurann Montana in Treatment: 0 Primary Learner Assessed: Patient Learning Preferences/Education Level/Primary Language Learning Preference: Explanation, Demonstration, Printed Material Highest Education Level: College or Above Preferred Language: English Cognitive Barrier Language Barrier: No Translator Needed: No Memory Deficit: No Emotional Barrier: No Cultural/Religious Beliefs Affecting Medical Care: No Physical Barrier Impaired Vision: Yes Glasses Impaired Hearing: No Decreased Hand dexterity: No Knowledge/Comprehension Knowledge Level: High Comprehension Level: High Ability to understand written instructions: High Ability to understand verbal instructions: High Motivation Anxiety Level: Calm Cooperation: Cooperative Education Importance: Acknowledges Need Interest in Health Problems: Asks Questions Perception: Coherent Willingness to Engage in Self-Management High Activities: Readiness to Engage in Self-Management High Activities: Electronic Signature(s) Signed: 04/11/2022 5:31:58 PM By: Zenaida Deed RN, BSN Entered By: Zenaida Deed on 04/10/2022 09:32:04 -------------------------------------------------------------------------------- Fall Risk Assessment Details Patient Name: Date of Service: Robert Brennan, Robert Brennan 04/10/2022 9:00 A M Medical Record Number: [id number] Patient Account Number: [id number] Date of Birth/Sex: Treating RN: 01-17-1971 (51 y.o. [name] Primary Care  Addalyn Speedy: MA [place], [place] TTHEW Other Clinician: Referring Chon Buhl: Treating Muhammadali Ries/Extender: Laurann Montana in Treatment: 0 Fall Risk Assessment Items Have you had 2 or more falls in the last 12 monthso 0 No Have you had any fall that resulted in injury in the last 12 monthso 0 No FALLS RISK SCREEN History of falling - immediate or within 3 months 0 No Secondary diagnosis (Do you have 2 or more medical diagnoseso) 0 No Ambulatory aid None/bed rest/wheelchair/nurse 0 Yes Crutches/cane/walker 0 No Furniture 0 No Intravenous therapy Access/Saline/Heparin Boykin Reaper  0 No Gait/Transferring Normal/ bed rest/ wheelchair 0 Yes Weak (short steps with or without shuffle, stooped but able to lift head while walking, may seek 0 No support from furniture) Impaired (short steps with shuffle, may have difficulty arising from chair, head down, impaired 0 No balance) Mental Status Oriented to own ability 0 Yes Electronic Signature(s) Signed: 04/11/2022 5:31:58 PM By: Zenaida Deed RN, BSN Entered By: Zenaida Deed on 04/10/2022 09:32:28 -------------------------------------------------------------------------------- Foot Assessment Details Patient Name: Date of Service: Robert Brennan, Robert Brennan 04/10/2022 9:00 A M Medical Record Number: [id number] Patient Account Number: [id number] Date of Birth/Sex: Treating RN: 1971-05-17 (51 y.o. [name] Primary Care Maricsa Sammons: MA Phillis [place], [place] TTHEW Other Clinician: Referring Rece Zechman: Treating Shone Leventhal/Extender: Laurann Montana in Treatment: 0 Foot Assessment Items Site Locations + = Sensation present, - = Sensation absent, C = Callus, U = Ulcer R = Redness, W = Warmth, M = Maceration, PU = Pre-ulcerative lesion F = Fissure, S = Swelling, D = Dryness Assessment Right: Left: Other Deformity: No No Prior Foot Ulcer: No No Prior Amputation: No No Charcot Joint: No No Ambulatory Status: Ambulatory Without  Help Gait: Steady Electronic Signature(s) Signed: 04/11/2022 5:31:58 PM By: Zenaida Deed RN, BSN Entered By: Zenaida Deed on 04/10/2022 09:39:02 -------------------------------------------------------------------------------- Nutrition Risk Screening Details Patient Name: Date of Service: Robert Brennan, Robert Brennan 04/10/2022 9:00 A M Medical Record Number: [id number] Patient Account Number: [id number] Date of Birth/Sex: Treating RN: 1971-09-04 (52 y.o. [name] Primary Care Ahnesti Townsend: MA Phillis [place], [place] TTHEW Other Clinician: Referring Dandra Velardi: Treating Albaraa Swingle/Extender: Laurann Montana in Treatment: 0 Height (in): 72 Weight (lbs): 280 Body Mass Index (BMI): 38 Nutrition Risk Screening Items Score Screening NUTRITION RISK SCREEN: I have an illness or condition that made me change the kind and/or amount of food I eat 0 No I eat fewer than two meals per day 0 No I eat few fruits and vegetables, or milk products 0 No I have three or more drinks of beer, liquor or wine almost every day 0 No I have tooth or mouth problems that make it hard for me to eat 0 No I don't always have enough money to buy the food I need 0 No I eat alone most of the time 0 No I take three or more different prescribed or over-the-counter drugs a day 1 Yes Without wanting to, I have lost or gained 10 pounds in the last six months 0 No I am not always physically able to shop, cook and/or feed myself 0 No Nutrition Protocols Good Risk Protocol 0 No interventions needed Moderate Risk Protocol High Risk Proctocol Risk Level: Good Risk Score: 1 Electronic Signature(s) Signed: 04/11/2022 5:31:58 PM By: Zenaida Deed RN, BSN Entered By: Zenaida Deed on 04/10/2022 09:32:49

## 2022-04-11 NOTE — Progress Notes (Signed)
Robert, Brennan Robert Brennan ([id number]) Visit Report for 04/10/2022 Chief Complaint Document Details Patient Name: Date of Service: Robert Brennan, Robert Brennan 04/10/2022 9:00 A M Medical Record Number: [id number] Patient Account Number: [id number] Date of Birth/Sex: Treating RN: 27-Nov-1970 (51 y.o. [name] Primary Care Provider: MA Phillis Haggis, [place] TTHEW Other Clinician: Referring Provider: Treating Provider/Extender: Laurann Montana in Treatment: 0 Information Obtained from: Patient Chief Complaint Patient seen for complaints of Non-Healing Wound. Electronic Signature(s) Signed: 04/10/2022 10:29:47 AM By: Duanne Guess MD FACS Entered By: Duanne Guess on 04/10/2022 10:29:47 -------------------------------------------------------------------------------- Debridement Details Patient Name: Date of Service: Robert Brennan, Robert Brennan 04/10/2022 9:00 A M Medical Record Number: [id number] Patient Account Number: [id number] Date of Birth/Sex: Treating RN: 19-May-1971 (51 y.o. [name] Primary Care Provider: MA Phillis Haggis, [place] TTHEW Other Clinician: Referring Provider: Treating Provider/Extender: Laurann Montana in Treatment: 0 Debridement Performed for Assessment: Wound #1 Right,Lateral Lower Leg Performed By: Physician Duanne Guess, MD Debridement Type: Debridement Severity of Tissue Pre Debridement: Fat layer exposed Level of Consciousness (Pre-procedure): Awake and Alert Pre-procedure Verification/Time Out Yes - 10:10 Taken: Start Time: 10:11 Pain Control: Lidocaine 4% T opical Solution T Area Debrided (L x W): otal 3 (cm) x 2.6 (cm) = 7.8 (cm) Tissue and other material debrided: Viable, Non-Viable, Slough, Subcutaneous, Slough Level: Skin/Subcutaneous Tissue Debridement Description: Excisional Instrument: Curette Bleeding: Minimum Hemostasis Achieved: Pressure Procedural Pain: 0 Post Procedural Pain: 0 Response to Treatment: Procedure was tolerated  well Level of Consciousness (Post- Awake and Alert procedure): Post Debridement Measurements of Total Wound Length: (cm) 3 Width: (cm) 2.6 Depth: (cm) 0.1 Volume: (cm) 0.613 Character of Wound/Ulcer Post Debridement: Improved Severity of Tissue Post Debridement: Fat layer exposed Post Procedure Diagnosis Same as Pre-procedure Electronic Signature(s) Signed: 04/10/2022 10:54:49 AM By: Duanne Guess MD FACS Signed: 04/11/2022 5:31:58 PM By: Zenaida Deed RN, BSN Entered By: Zenaida Deed on 04/10/2022 10:16:34 -------------------------------------------------------------------------------- Debridement Details Patient Name: Date of Service: Robert Brennan, Robert Brennan 04/10/2022 9:00 A M Medical Record Number: [id number] Patient Account Number: [id number] Date of Birth/Sex: Treating RN: 11-18-1970 (51 y.o. [name] Primary Care Provider: MA Phillis Haggis, [place] TTHEW Other Clinician: Referring Provider: Treating Provider/Extender: Laurann Montana in Treatment: 0 Debridement Performed for Assessment: Wound #2 Right,Distal,Lateral Lower Leg Performed By: Physician Duanne Guess, MD Debridement Type: Debridement Severity of Tissue Pre Debridement: Fat layer exposed Level of Consciousness (Pre-procedure): Awake and Alert Pre-procedure Verification/Time Out Yes - 10:10 Taken: Start Time: 10:11 Pain Control: Lidocaine 4% T opical Solution T Area Debrided (L x W): otal 1.9 (cm) x 1.9 (cm) = 3.61 (cm) Tissue and other material debrided: Viable, Non-Viable, Slough, Subcutaneous, Slough Level: Skin/Subcutaneous Tissue Debridement Description: Excisional Instrument: Curette Bleeding: Minimum Hemostasis Achieved: Pressure Procedural Pain: 0 Post Procedural Pain: 0 Response to Treatment: Procedure was tolerated well Level of Consciousness (Post- Awake and Alert procedure): Post Debridement Measurements of Total Wound Length: (cm) 1.9 Width: (cm) 1.9 Depth: (cm)  0.1 Volume: (cm) 0.284 Character of Wound/Ulcer Post Debridement: Improved Severity of Tissue Post Debridement: Fat layer exposed Post Procedure Diagnosis Same as Pre-procedure Electronic Signature(s) Signed: 04/10/2022 10:54:49 AM By: Duanne Guess MD FACS Signed: 04/11/2022 5:31:58 PM By: Zenaida Deed RN, BSN Entered By: Zenaida Deed on 04/10/2022 10:17:07 -------------------------------------------------------------------------------- Debridement Details Patient Name: Date of Service: Robert Brennan, Robert Brennan 04/10/2022 9:00 A M Medical Record Number: [id number] Patient Account Number: [id number] Date of Birth/Sex: Treating RN: 03/13/1971 (51 y.o. [name] Primary Care Provider: MA Phillis Haggis, [place]  TTHEW Other Clinician: Referring Provider: Treating Provider/Extender: Laurann Montana in Treatment: 0 Debridement Performed for Assessment: Wound #3 Right,Posterior Lower Leg Performed By: Physician Duanne Guess, MD Debridement Type: Debridement Severity of Tissue Pre Debridement: Fat layer exposed Level of Consciousness (Pre-procedure): Awake and Alert Pre-procedure Verification/Time Out Yes - 10:10 Taken: Start Time: 10:11 Pain Control: Lidocaine 4% T opical Solution T Area Debrided (L x W): otal 0.4 (cm) x 0.5 (cm) = 0.2 (cm) Tissue and other material debrided: Viable, Non-Viable, Slough, Subcutaneous, Slough Level: Skin/Subcutaneous Tissue Debridement Description: Excisional Instrument: Curette Bleeding: Minimum Hemostasis Achieved: Pressure Procedural Pain: 0 Post Procedural Pain: 0 Response to Treatment: Procedure was tolerated well Level of Consciousness (Post- Awake and Alert procedure): Post Debridement Measurements of Total Wound Length: (cm) 0.4 Width: (cm) 0.5 Depth: (cm) 0.1 Volume: (cm) 0.016 Character of Wound/Ulcer Post Debridement: Improved Severity of Tissue Post Debridement: Fat layer exposed Post Procedure  Diagnosis Same as Pre-procedure Electronic Signature(s) Signed: 04/10/2022 10:54:49 AM By: Duanne Guess MD FACS Signed: 04/11/2022 5:31:58 PM By: Zenaida Deed RN, BSN Entered By: Zenaida Deed on 04/10/2022 10:17:40 -------------------------------------------------------------------------------- HPI Details Patient Name: Date of Service: Robert Brennan, Robert Brennan 04/10/2022 9:00 A M Medical Record Number: [id number] Patient Account Number: [id number] Date of Birth/Sex: Treating RN: 09-10-1971 (51 y.o. [name] Primary Care Provider: MA Phillis Haggis, [place] TTHEW Other Clinician: Referring Provider: Treating Provider/Extender: Laurann Montana in Treatment: 0 History of Present Illness HPI Description: ADMISSION 04/10/2022 This is a 51 year old male with a past medical history significant for poorly controlled type 2 diabetes mellitus (hemoglobin A1c 11.8% in November 2022). He fell and struck his right anterior tibial surface in October or November 2022. He developed a hematoma but subsequently became infected. He underwent debridement on multiple occasions. Ultimately, he was left with a substantial defect that was skin grafted by plastic surgery. The skin graft has taken well but he still has some open tissue around the periphery. He had 2 new areas of breakdown occur secondary to drainage and moisture. Plastic surgery referred him to the wound care center for further evaluation and management. At the site of his skin graft, there has been excellent take, greater than 90%. Around the periphery, there is a thin rim of exposed fat layer with a bit of slough accumulation. There is granulation tissue forming. Just distal to this, there is a second wound that exposes the fat layer with a fair amount of slough buildup. A similar, albeit smaller wound is located on the posterior leg. No concern for infection. He does have lymphedema pumps and says that he uses them about every  other day. He had venous reflux studies done that were negative. I do not see any evidence of arterial studies. ABIs in clinic today were noncompressible but he has an easily palpable dorsalis pedis and posterior tibialis pulse. Electronic Signature(s) Signed: 04/10/2022 10:36:20 AM By: Duanne Guess MD FACS Previous Signature: 04/10/2022 10:35:17 AM Version By: Duanne Guess MD FACS Entered By: Duanne Guess on 04/10/2022 10:36:20 -------------------------------------------------------------------------------- Physical Exam Details Patient Name: Date of Service: Robert Brennan, Robert Brennan 04/10/2022 9:00 A M Medical Record Number: [id number] Patient Account Number: [id number] Date of Birth/Sex: Treating RN: [birth year] (51 y.o. [name] Primary Care Provider: MA Phillis Haggis, [place] TTHEW Other Clinician: Referring Provider: Treating Provider/Extender: Laurann Montana in Treatment: 0 Constitutional . Slightly tachycardic, asymptomatic.. . . No acute distress. Respiratory Normal work of breathing on room air.. Cardiovascular . Notes 04/10/2022: At  the site of his skin graft, there has been excellent take, greater than 90%. Around the periphery, there is a thin rim of exposed fat layer with a bit of slough accumulation. There is granulation tissue forming. Just distal to this, there is a second wound that exposes the fat layer with a fair amount of slough buildup. A similar, albeit smaller wound is located on the posterior leg. No concern for infection. Electronic Signature(s) Signed: 04/10/2022 10:37:00 AM By: Duanne Guess MD FACS Entered By: Duanne Guess on 04/10/2022 10:37:00 -------------------------------------------------------------------------------- Physician Orders Details Patient Name: Date of Service: Robert Brennan, Robert Brennan 04/10/2022 9:00 A M Medical Record Number: [id number] Patient Account Number: [id number] Date of Birth/Sex: Treating RN: [birth year] (51 y.o.  [name] Primary Care Provider: MA Phillis Haggis, [place] TTHEW Other Clinician: Referring Provider: Treating Provider/Extender: Laurann Montana in Treatment: 0 Verbal / Phone Orders: No Diagnosis Coding ICD-10 Coding Code Description [phone] Non-pressure chronic ulcer of 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 exposed I87.2 Venous insufficiency (chronic) (peripheral) I89.0 Lymphedema, not elsewhere classified E11.622 Type 2 diabetes mellitus with other skin ulcer Follow-up Appointments ppointment in 1 week. - Dr. Lady Gary RM 1 with Bonita Quin Return A Monday 7/24 @ 08:15 Bathing/ Shower/ Hygiene May shower with protection but do not get wound dressing(s) wet. Edema Control - Lymphedema / SCD / Other Lymphedema Pumps. Use Lymphedema pumps on leg(s) 2-3 times a day for 45-60 minutes. If wearing any wraps or hose, do not remove them. Continue exercising as instructed. - use once per day Elevate legs to the level of the heart or above for 30 minutes daily and/or when sitting, a frequency of: - throughout the day Avoid standing for long periods of time. Exercise regularly Compression stocking or Garment 20-30 mm/Hg pressure to: - left leg daily Wound Treatment Wound #1 - Lower Leg Wound Laterality: Right, Lateral Peri-Wound Care: Sween Lotion (Moisturizing lotion) 1 x Per Week/30 Days Discharge Instructions: Apply moisturizing lotion as directed Prim Dressing: Promogran Prisma Matrix, 4.34 (sq in) (silver collagen) 1 x Per Week/30 Days ary Discharge Instructions: Moisten collagen with saline or hydrogel Secondary Dressing: Woven Gauze Sponge, Non-Sterile 4x4 in 1 x Per Week/30 Days Discharge Instructions: Apply over primary dressing as directed. Compression Wrap: ThreePress (3 layer compression wrap) 1 x Per Week/30 Days Discharge Instructions: Apply three layer compression as directed. Wound #2 - Lower Leg Wound Laterality: Right, Lateral, Distal Peri-Wound  Care: Sween Lotion (Moisturizing lotion) 1 x Per Week/30 Days Discharge Instructions: Apply moisturizing lotion as directed Prim Dressing: Promogran Prisma Matrix, 4.34 (sq in) (silver collagen) 1 x Per Week/30 Days ary Discharge Instructions: Moisten collagen with saline or hydrogel Secondary Dressing: Woven Gauze Sponge, Non-Sterile 4x4 in 1 x Per Week/30 Days Discharge Instructions: Apply over primary dressing as directed. Compression Wrap: ThreePress (3 layer compression wrap) 1 x Per Week/30 Days Discharge Instructions: Apply three layer compression as directed. Wound #3 - Lower Leg Wound Laterality: Right, Posterior Peri-Wound Care: Sween Lotion (Moisturizing lotion) 1 x Per Week/30 Days Discharge Instructions: Apply moisturizing lotion as directed Prim Dressing: Promogran Prisma Matrix, 4.34 (sq in) (silver collagen) 1 x Per Week/30 Days ary Discharge Instructions: Moisten collagen with saline or hydrogel Secondary Dressing: Woven Gauze Sponge, Non-Sterile 4x4 in 1 x Per Week/30 Days Discharge Instructions: Apply over primary dressing as directed. Compression Wrap: ThreePress (3 layer compression wrap) 1 x Per Week/30 Days Discharge Instructions: Apply three layer compression as directed. Patient Medications llergies: No Known Allergies A  Notifications Medication Indication Start End prior to debridement 04/10/2022 lidocaine DOSE topical 4 % cream - cream topical Electronic Signature(s) Signed: 04/10/2022 10:41:53 AM By: Duanne Guessannon, Braxson Hollingsworth MD FACS Entered By: Duanne Guessannon, Ziona Wickens on 04/10/2022 10:41:53 -------------------------------------------------------------------------------- Problem List Details Patient Name: Date of Service: Robert PrimerBA NKS, Robert Brennan 04/10/2022 9:00 A M Medical Record Number: [id number] Patient Account Number: [id number] Date of Birth/Sex: Treating RN: 09/15/1971 (51 y.o. Damaris SchoonerM) Boehlein, Linda Primary Care Provider: MA Phillis HaggisHIA [place], [place] TTHE[place] Other Clinician: Referring  Provider: Treating Provider/Extender: Laurann Montanaannon, Ayce Pietrzyk Scheeler, Matthew Weeks in Treatment: 0 Active Problems ICD-10 Encounter Code Description Active Date MDM Diagnosis 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 of 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 04/10/2022 No Yes exposed I89.0 Lymphedema, not elsewhere classified 04/10/2022 No Yes E11.622 Type 2 diabetes mellitus with other skin ulcer 04/10/2022 No Yes Inactive Problems Resolved Problems Electronic Signature(s) Signed: 04/10/2022 10:29:21 AM By: Duanne Guessannon, Normalee Sistare MD FACS Previous Signature: 04/10/2022 9:53:50 AM Version By: Duanne Guessannon, Kida Digiulio MD FACS Entered By: Duanne Guessannon, Sheronica Corey on 04/10/2022 10:29:21 -------------------------------------------------------------------------------- Progress Note Details Patient Name: Date of Service: Robert PrimerBA NKS, Robert Brennan 04/10/2022 9:00 A M Medical Record Number: [id number] Patient Account Number: [id number] Date of Birth/Sex: Treating RN: 01/28/1971 (51 y.o. Damaris SchoonerM) Boehlein, Linda Primary Care Provider: MA Phillis HaggisHIA S, [place] TTHE[place] Other Clinician: Referring Provider: Treating Provider/Extender: Laurann Montanaannon, Dariel Pellecchia Scheeler, Matthew Weeks in Treatment: 0 Subjective Chief Complaint Information obtained from Patient Patient seen for complaints of Non-Healing Wound. History of Present Illness (HPI) ADMISSION 04/10/2022 This is a 51 year old male with a past medical history significant for poorly controlled type 2 diabetes mellitus (hemoglobin A1c 11.8% in November 2022). He fell and struck his right anterior tibial surface in October or November 2022. He developed a hematoma but subsequently became infected. He underwent debridement on multiple occasions. Ultimately, he was left with a substantial defect that was skin grafted by plastic surgery. The skin graft has taken well but he still has some open tissue around the periphery. He had 2 new areas of breakdown occur secondary to drainage and moisture. Plastic surgery  referred him to the wound care center for further evaluation and management. At the site of his skin graft, there has been excellent take, greater than 90%. Around the periphery, there is a thin rim of exposed fat layer with a bit of slough accumulation. There is granulation tissue forming. Just distal to this, there is a second wound that exposes the fat layer with a fair amount of slough buildup. A similar, albeit smaller wound is located on the posterior leg. No concern for infection. He does have lymphedema pumps and says that he uses them about every other day. He had venous reflux studies done that were negative. I do not see any evidence of arterial studies. ABIs in clinic today were noncompressible but he has an easily palpable dorsalis pedis and posterior tibialis pulse. Patient History Information obtained from Patient, Chart. Allergies No Known Allergies Family History Cancer - Father,Paternal Grandparents, Diabetes - Maternal Grandparents,Father, Hypertension - Mother,Father, No family history of Heart Disease, Hereditary Spherocytosis, Kidney Disease, Lung Disease, Seizures, Stroke, Thyroid Problems, Tuberculosis. Social History Never smoker, Marital Status - Married, Alcohol Use - Never, Drug Use - No History, Caffeine Use - Moderate. Medical History Eyes Denies history of Cataracts, Glaucoma, Optic Neuritis Cardiovascular Patient has history of Hypertension Endocrine Patient has history of Type II Diabetes Denies history of Type I Diabetes Integumentary (Skin) Denies history of History of Burn Musculoskeletal Patient has history of Gout Neurologic Patient has history of  Neuropathy Oncologic Denies history of Received Chemotherapy, Received Radiation Psychiatric Denies history of Anorexia/bulimia, Confinement Anxiety Patient is treated with Insulin, Oral Agents. Blood sugar is not tested. Hospitalization/Surgery History - debridements of right leg ulcer. - split  thickness skin graft right lower leg. Medical A Surgical History Notes nd Cardiovascular hyperlipidemia Review of Systems (ROS) Constitutional Symptoms (General Health) Denies complaints or symptoms of Fatigue, Fever, Chills, Marked Weight Change. Eyes Complains or has symptoms of Glasses / Contacts. Denies complaints or symptoms of Dry Eyes, Vision Changes. Ear/Nose/Mouth/Throat Denies complaints or symptoms of Chronic sinus problems or rhinitis. Respiratory Denies complaints or symptoms of Chronic or frequent coughs, Shortness of Breath. Cardiovascular Denies complaints or symptoms of Chest pain. Gastrointestinal Denies complaints or symptoms of Frequent diarrhea, Nausea, Vomiting. Endocrine Denies complaints or symptoms of Heat/cold intolerance. Genitourinary Denies complaints or symptoms of Frequent urination. Integumentary (Skin) Complains or has symptoms of Wounds - right lower leg. Musculoskeletal Denies complaints or symptoms of Muscle Pain, Muscle Weakness. Neurologic Denies complaints or symptoms of Numbness/parasthesias. Psychiatric Denies complaints or symptoms of Claustrophobia, Suicidal. Objective Constitutional Slightly tachycardic, asymptomatic.Marland Kitchen No acute distress. Vitals Time Taken: 9:09 AM, Height: 72 in, Source: Stated, Weight: 280 lbs, Source: Stated, BMI: 38, Temperature: 98.7 F, Pulse: 101 bpm, Respiratory Rate: 18 breaths/min, Blood Pressure: 138/87 mmHg. Respiratory Normal work of breathing on room air.. General Notes: 04/10/2022: At the site of his skin graft, there has been excellent take, greater than 90%. Around the periphery, there is a thin rim of exposed fat layer with a bit of slough accumulation. There is granulation tissue forming. Just distal to this, there is a second wound that exposes the fat layer with a fair amount of slough buildup. A similar, albeit smaller wound is located on the posterior leg. No concern for  infection. Integumentary (Hair, Skin) Wound #1 status is Open. Original cause of wound was Trauma. The date acquired was: 07/24/2021. The wound is located on the Right,Lateral Lower Leg. The wound measures 3cm length x 2.6cm width x 0.1cm depth; 6.126cm^2 area and 0.613cm^3 volume. There is Fat Layer (Subcutaneous Tissue) exposed. There is no tunneling or undermining noted. There is a medium amount of serosanguineous drainage noted. The wound margin is flat and intact. There is large (67- 100%) red granulation within the wound bed. There is a small (1-33%) amount of necrotic tissue within the wound bed including Adherent Slough. Wound #2 status is Open. Original cause of wound was Trauma. The date acquired was: 09/26/2021. The wound is located on the Right,Distal,Lateral Lower Leg. The wound measures 1.9cm length x 1.9cm width x 0.1cm depth; 2.835cm^2 area and 0.284cm^3 volume. There is Fat Layer (Subcutaneous Tissue) exposed. There is no tunneling or undermining noted. There is a medium amount of serosanguineous drainage noted. The wound margin is flat and intact. There is medium (34-66%) red granulation within the wound bed. There is a medium (34-66%) amount of necrotic tissue within the wound bed including Adherent Slough. Wound #3 status is Open. Original cause of wound was Gradually Appeared. The date acquired was: 07/24/2021. The wound is located on the Right,Posterior Lower Leg. The wound measures 0.4cm length x 0.5cm width x 0.1cm depth; 0.157cm^2 area and 0.016cm^3 volume. There is Fat Layer (Subcutaneous Tissue) exposed. There is no tunneling or undermining noted. There is a medium amount of serosanguineous drainage noted. The wound margin is flat and intact. There is large (67-100%) red granulation within the wound bed. There is no necrotic tissue within the wound bed. Assessment  Active Problems ICD-10 Non-pressure chronic ulcer of 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  exposed Lymphedema, not elsewhere classified Type 2 diabetes mellitus with other skin ulcer Procedures Wound #1 Pre-procedure diagnosis of Wound #1 is a Diabetic Wound/Ulcer of the Lower Extremity located on the Right,Lateral Lower Leg .Severity of Tissue Pre Debridement is: Fat layer exposed. There was a Excisional Skin/Subcutaneous Tissue Debridement with a total area of 7.8 sq cm performed by Duanne Guess, MD. With the following instrument(s): Curette to remove Viable and Non-Viable tissue/material. Material removed includes Subcutaneous Tissue and Slough and after achieving pain control using Lidocaine 4% T opical Solution. No specimens were taken. A time out was conducted at 10:10, prior to the start of the procedure. A Minimum amount of bleeding was controlled with Pressure. The procedure was tolerated well with a pain level of 0 throughout and a pain level of 0 following the procedure. Post Debridement Measurements: 3cm length x 2.6cm width x 0.1cm depth; 0.613cm^3 volume. Character of Wound/Ulcer Post Debridement is improved. Severity of Tissue Post Debridement is: Fat layer exposed. Post procedure Diagnosis Wound #1: Same as Pre-Procedure Pre-procedure diagnosis of Wound #1 is a Diabetic Wound/Ulcer of the Lower Extremity located on the Right,Lateral Lower Leg . There was a Three Layer Compression Therapy Procedure by Zenaida Deed, RN. Post procedure Diagnosis Wound #1: Same as Pre-Procedure Wound #2 Pre-procedure diagnosis of Wound #2 is a Diabetic Wound/Ulcer of the Lower Extremity located on the Right,Distal,Lateral Lower Leg .Severity of Tissue Pre Debridement is: Fat layer exposed. There was a Excisional Skin/Subcutaneous Tissue Debridement with a total area of 3.61 sq cm performed by Duanne Guess, MD. With the following instrument(s): Curette to remove Viable and Non-Viable tissue/material. Material removed includes Subcutaneous Tissue and Slough and after achieving pain  control using Lidocaine 4% T opical Solution. No specimens were taken. A time out was conducted at 10:10, prior to the start of the procedure. A Minimum amount of bleeding was controlled with Pressure. The procedure was tolerated well with a pain level of 0 throughout and a pain level of 0 following the procedure. Post Debridement Measurements: 1.9cm length x 1.9cm width x 0.1cm depth; 0.284cm^3 volume. Character of Wound/Ulcer Post Debridement is improved. Severity of Tissue Post Debridement is: Fat layer exposed. Post procedure Diagnosis Wound #2: Same as Pre-Procedure Wound #3 Pre-procedure diagnosis of Wound #3 is a Diabetic Wound/Ulcer of the Lower Extremity located on the Right,Posterior Lower Leg .Severity of Tissue Pre Debridement is: Fat layer exposed. There was a Excisional Skin/Subcutaneous Tissue Debridement with a total area of 0.2 sq cm performed by Duanne Guess, MD. With the following instrument(s): Curette to remove Viable and Non-Viable tissue/material. Material removed includes Subcutaneous Tissue and Slough and after achieving pain control using Lidocaine 4% T opical Solution. No specimens were taken. A time out was conducted at 10:10, prior to the start of the procedure. A Minimum amount of bleeding was controlled with Pressure. The procedure was tolerated well with a pain level of 0 throughout and a pain level of 0 following the procedure. Post Debridement Measurements: 0.4cm length x 0.5cm width x 0.1cm depth; 0.016cm^3 volume. Character of Wound/Ulcer Post Debridement is improved. Severity of Tissue Post Debridement is: Fat layer exposed. Post procedure Diagnosis Wound #3: Same as Pre-Procedure Plan Follow-up Appointments: Return Appointment in 1 week. - Dr. Lady Gary RM 1 with [facility] Monday 7/24 @ 08:15 Bathing/ Shower/ Hygiene: May shower with protection but do not get wound dressing(s) wet. Edema Control -  Lymphedema / SCD / Other: Lymphedema Pumps. Use Lymphedema pumps  on leg(s) 2-3 times a day for 45-60 minutes. If wearing any wraps or hose, do not remove them. Continue exercising as instructed. - use once per day Elevate legs to the level of the heart or above for 30 minutes daily and/or when sitting, a frequency of: - throughout the day Avoid standing for long periods of time. Exercise regularly Compression stocking or Garment 20-30 mm/Hg pressure to: - left leg daily The following medication(s) was prescribed: lidocaine topical 4 % cream cream topical for prior to debridement was prescribed at facility WOUND #1: - Lower Leg Wound Laterality: Right, Lateral Peri-Wound Care: Sween Lotion (Moisturizing lotion) 1 x Per Week/30 Days Discharge Instructions: Apply moisturizing lotion as directed Prim Dressing: Promogran Prisma Matrix, 4.34 (sq in) (silver collagen) 1 x Per Week/30 Days ary Discharge Instructions: Moisten collagen with saline or hydrogel Secondary Dressing: Woven Gauze Sponge, Non-Sterile 4x4 in 1 x Per Week/30 Days Discharge Instructions: Apply over primary dressing as directed. Com pression Wrap: ThreePress (3 layer compression wrap) 1 x Per Week/30 Days Discharge Instructions: Apply three layer compression as directed. WOUND #2: - Lower Leg Wound Laterality: Right, Lateral, Distal Peri-Wound Care: Sween Lotion (Moisturizing lotion) 1 x Per Week/30 Days Discharge Instructions: Apply moisturizing lotion as directed Prim Dressing: Promogran Prisma Matrix, 4.34 (sq in) (silver collagen) 1 x Per Week/30 Days ary Discharge Instructions: Moisten collagen with saline or hydrogel Secondary Dressing: Woven Gauze Sponge, Non-Sterile 4x4 in 1 x Per Week/30 Days Discharge Instructions: Apply over primary dressing as directed. Com pression Wrap: ThreePress (3 layer compression wrap) 1 x Per Week/30 Days Discharge Instructions: Apply three layer compression as directed. WOUND #3: - Lower Leg Wound Laterality: Right, Posterior Peri-Wound Care: Sween  Lotion (Moisturizing lotion) 1 x Per Week/30 Days Discharge Instructions: Apply moisturizing lotion as directed Prim Dressing: Promogran Prisma Matrix, 4.34 (sq in) (silver collagen) 1 x Per Week/30 Days ary Discharge Instructions: Moisten collagen with saline or hydrogel Secondary Dressing: Woven Gauze Sponge, Non-Sterile 4x4 in 1 x Per Week/30 Days Discharge Instructions: Apply over primary dressing as directed. Com pression Wrap: ThreePress (3 layer compression wrap) 1 x Per Week/30 Days Discharge Instructions: Apply three layer compression as directed. 04/10/2022: This is a 51 year old man with type 2 diabetes mellitus who suffered a traumatic injury to his leg. Subsequent debridements ultimately resulted in skin graft application. At the site of his skin graft, there has been excellent take, greater than 90%. Around the periphery, there is a thin rim of exposed fat layer with a bit of slough accumulation. There is granulation tissue forming. Just distal to this, there is a second wound that exposes the fat layer with a fair amount of slough buildup. A similar, albeit smaller wound is located on the posterior leg. No concern for infection. I used a curette to debride slough and nonviable subcutaneous tissue from all of the open wound sites. We will use Prisma silver collagen to each wound and apply 3 layer compression. He was encouraged to use his lymphedema pumps daily and was reassured that he can do this over the dressings. He should also elevate his legs is much as possible at home. Follow-up in 1 week. Electronic Signature(s) Signed: 04/10/2022 10:45:42 AM By: Duanne Guess MD FACS Entered By: Duanne Guess on 04/10/2022 10:45:42 -------------------------------------------------------------------------------- HxROS Details Patient Name: Date of Service: Robert Brennan, Kauan 04/10/2022 9:00 A M Medical Record Number: [id number] Patient Account Number: [id number] Date of Birth/Sex: Treating  RN: 1970/10/09 (51 y.o. [name] Primary Care Provider: MA Phillis Haggis, [place] TTHEW Other Clinician: Referring Provider: Treating Provider/Extender: Laurann Montana in Treatment: 0 Information Obtained From Patient Chart Constitutional Symptoms (General Health) Complaints and Symptoms: Negative for: Fatigue; Fever; Chills; Marked Weight Change Eyes Complaints and Symptoms: Positive for: Glasses / Contacts Negative for: Dry Eyes; Vision Changes Medical History: Negative for: Cataracts; Glaucoma; Optic Neuritis Ear/Nose/Mouth/Throat Complaints and Symptoms: Negative for: Chronic sinus problems or rhinitis Respiratory Complaints and Symptoms: Negative for: Chronic or frequent coughs; Shortness of Breath Cardiovascular Complaints and Symptoms: Negative for: Chest pain Medical History: Positive for: Hypertension Past Medical History Notes: hyperlipidemia Gastrointestinal Complaints and Symptoms: Negative for: Frequent diarrhea; Nausea; Vomiting Endocrine Complaints and Symptoms: Negative for: Heat/cold intolerance Medical History: Positive for: Type II Diabetes Negative for: Type I Diabetes Time with diabetes: 21 yrs Treated with: Insulin, Oral agents Blood sugar tested every day: No Genitourinary Complaints and Symptoms: Negative for: Frequent urination Integumentary (Skin) Complaints and Symptoms: Positive for: Wounds - right lower leg Medical History: Negative for: History of Burn Musculoskeletal Complaints and Symptoms: Negative for: Muscle Pain; Muscle Weakness Medical History: Positive for: Gout Neurologic Complaints and Symptoms: Negative for: Numbness/parasthesias Medical History: Positive for: Neuropathy Psychiatric Complaints and Symptoms: Negative for: Claustrophobia; Suicidal Medical History: Negative for: Anorexia/bulimia; Confinement Anxiety Hematologic/Lymphatic Immunological Oncologic Medical History: Negative  for: Received Chemotherapy; Received Radiation Immunizations Pneumococcal Vaccine: Received Pneumococcal Vaccination: Yes Received Pneumococcal Vaccination On or After 60th Birthday: No Implantable Devices No devices added Hospitalization / Surgery History Type of Hospitalization/Surgery debridements of right leg ulcer split thickness skin graft right lower leg Family and Social History Cancer: Yes - Father,Paternal Grandparents; Diabetes: Yes - Maternal Grandparents,Father; Heart Disease: No; Hereditary Spherocytosis: No; Hypertension: Yes - Mother,Father; Kidney Disease: No; Lung Disease: No; Seizures: No; Stroke: No; Thyroid Problems: No; Tuberculosis: No; Never smoker; Marital Status - Married; Alcohol Use: Never; Drug Use: No History; Caffeine Use: Moderate; Financial Concerns: No; Food, Clothing or Shelter Needs: No; Support System Lacking: No; Transportation Concerns: No Electronic Signature(s) Signed: 04/10/2022 10:54:49 AM By: Duanne Guess MD FACS Signed: 04/11/2022 5:31:58 PM By: Zenaida Deed RN, BSN Entered By: Zenaida Deed on 04/10/2022 09:30:38 -------------------------------------------------------------------------------- SuperBill Details Patient Name: Date of Service: Robert Brennan, Robert Brennan 04/10/2022 Medical Record Number: [id number] Patient Account Number: [id number] Date of Birth/Sex: Treating RN: 09-Aug-1971 (51 y.o. [name] Primary Care Provider: MA Phillis Haggis, [place] TTHEW Other Clinician: Referring Provider: Treating Provider/Extender: Laurann Montana in Treatment: 0 Diagnosis Coding ICD-10 Codes Code Description [id number] Non-pressure chronic ulcer of 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 exposed I89.0 Lymphedema, not elsewhere classified E11.622 Type 2 diabetes mellitus with other skin ulcer Facility Procedures CPT4 Code: [id number] Description: 99213 - WOUND CARE VISIT-LEV 3 EST PT Modifier: 25 Quantity: 1 CPT4 Code:  [id number] Description: 11042 - DEB SUBQ TISSUE 20 SQ CM/< ICD-10 Diagnosis Description 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 of 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 expos Modifier: ed Quantity: 1 Physician Procedures : CPT4 Code Description Modifier [id number] 99204 - WC PHYS LEVEL 4 - NEW PT 25 ICD-10 Diagnosis Description 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 of 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 exposed I89.0 Lymphedema, not elsewhere classified E11.622 Type 2  diabetes mellitus with other skin ulcer Quantity: 1 Electronic Signature(s) Signed: 04/11/2022 10:08:55 AM By: Duanne Guess MD FACS Signed: 04/11/2022 5:31:58 PM By: Zenaida Deed RN, BSN Previous Signature: 04/10/2022 10:46:02 AM Version By:  Duanne Guess MD FACS Entered By: Zenaida Deed on 04/11/2022 08:13:08

## 2022-04-17 ENCOUNTER — Encounter (HOSPITAL_BASED_OUTPATIENT_CLINIC_OR_DEPARTMENT_OTHER): Payer: BC Managed Care – PPO | Admitting: General Surgery

## 2022-04-17 DIAGNOSIS — L97812 Non-pressure chronic ulcer of 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 exposed: Secondary | ICD-10-CM | POA: Diagnosis not present

## 2022-04-17 NOTE — Progress Notes (Signed)
Robert Brennan, Robert Brennan ([id number]) Visit Report for 04/17/2022 Arrival Information Details Patient Name: Date of Service: Robert Brennan, Robert Brennan 04/17/2022 8:15 A M Medical Record Number: [id number] Patient Account Number: [id number] Date of Birth/Sex: Treating RN: [birth year] (51 y.o. [name] Primary Care Tam Delisle: MA Phillis Haggis, [place] TTHEW Other Clinician: Referring Maxwell Martorano: Treating Hazely Sealey/Extender: Duanne Guess MA [place], [place] TTHE[place] Weeks in Treatment: 1 Visit Information History Since Last Visit All ordered tests and consults were completed: Yes Patient Arrived: Ambulatory Added or deleted any medications: No Arrival Time: 08:09 Any new allergies or adverse reactions: No Accompanied By: self Had a fall or experienced change in No Transfer Assistance: None activities of daily living that may affect Patient Requires Transmission-Based Precautions: No risk of falls: Patient Has Alerts: No Signs or symptoms of abuse/neglect since last visito No Hospitalized since last visit: No Pain Present Now: No Electronic Signature(s) Signed: 04/17/2022 5:10:34 PM By: Tommie Ard RN Entered By: Tommie Ard on 04/17/2022 08:10:48 -------------------------------------------------------------------------------- Compression Therapy Details Patient Name: Date of Service: Robert Brennan, Robert Brennan 04/17/2022 8:15 A M Medical Record Number: [id number] Patient Account Number: [id number] Date of Birth/Sex: Treating RN: 08/31/1971 (51 y.o. [name] Primary Care Yoali Conry: MA Phillis [place], [place] TTHEW Other Clinician: Referring Zainab Crumrine: Treating Alexi Geibel/Extender: Duanne Guess MA [place], [place] TTHE[place] Weeks in Treatment: 1 Compression Therapy Performed for Wound Assessment: Wound #1 Right,Lateral Lower Leg Performed By: Clinician Zenaida Deed, RN Compression Type: Three Layer Post Procedure Diagnosis Same as Pre-procedure Electronic Signature(s) Signed: 04/17/2022 5:10:34 PM By: Tommie Ard RN Entered By: Tommie Ard on 04/17/2022 08:37:32 -------------------------------------------------------------------------------- Encounter Discharge Information Details Patient Name: Date of Service: Robert Brennan, Robert Brennan 04/17/2022 8:15 A M Medical Record Number: [id number] Patient Account Number: [id number] Date of Birth/Sex: Treating RN: Jun 21, 1971 (51 y.o. [name] Primary Care Illianna Paschal: MA Phillis [place], [place] TTHEW Other Clinician: Referring Inaaya Vellucci: Treating Kourtnie Sachs/Extender: Duanne Guess MA [place], [place] TTHE[place] Weeks in Treatment: 1 Encounter Discharge Information Items Post Procedure Vitals Discharge Condition: Stable Temperature (F): 100.0 Ambulatory Status: Ambulatory Pulse (bpm): 122 Discharge Destination: Home Respiratory Rate (breaths/min): 18 Transportation: Private Auto Blood Pressure (mmHg): 152/87 Accompanied By: self Schedule Follow-up Appointment: No Clinical Summary of Care: Electronic Signature(s) Signed: 04/17/2022 5:10:34 PM By: Tommie Ard RN Entered By: Tommie Ard on 04/17/2022 08:57:51 -------------------------------------------------------------------------------- Lower Extremity Assessment Details Patient Name: Date of Service: Robert Brennan, Robert Brennan 04/17/2022 8:15 A M Medical Record Number: [id number] Patient Account Number: [id number] Date of Birth/Sex: Treating RN: 10-26-1970 (51 y.o. [name] Primary Care Eva Vallee: MA Phillis [place], [place] TTHEW Other Clinician: Referring Zackry Deines: Treating Kaytelynn Scripter/Extender: Duanne Guess MA [place], [place] TTHE[place] Weeks in Treatment: 1 Edema Assessment Assessed: [Left: No] [Right: No] Edema: [Left: Ye] [Right: s] Calf Left: Right: Point of Measurement: From Medial Instep 41 cm Ankle Left: Right: Point of Measurement: From Medial Instep 24.2 cm Vascular Assessment Pulses: Dorsalis Pedis Palpable: [Right:Yes] Electronic Signature(s) Signed: 04/17/2022 5:10:34 PM By: Tommie Ard RN Signed: 04/17/2022 5:44:10 PM By: Zenaida Deed RN,  BSN Entered By: Tommie Ard on 04/17/2022 08:20:30 -------------------------------------------------------------------------------- Multi Wound Chart Details Patient Name: Date of Service: Robert Brennan, Robert Brennan 04/17/2022 8:15 A M Medical Record Number: [id number] Patient Account Number: [id number] Date of Birth/Sex: Treating RN: [birth year] (51 y.o. [name] Primary Care Cris Gibby: Other Clinician: MA Phillis Haggis, MA TTHEW Referring Davon Abdelaziz: Treating Maddeline Roorda/Extender: Duanne Guess MA [place], [place] TTHE[place] Weeks in Treatment: 1 Vital Signs Height(in): 72 Capillary Blood Glucose(mg/dl): 341 Weight(lbs): 937 Pulse(bpm): 122 Body Mass Index(BMI): 38  Blood Pressure(mmHg): 152/87 Temperature(F): 100.0 Respiratory Rate(breaths/min): 18 Photos: Right, Lateral Lower Leg Right, Distal, Lateral Lower Leg Right, Posterior Lower Leg Wound Location: Trauma Trauma Gradually Appeared Wounding Event: Diabetic Wound/Ulcer of the Lower Diabetic Wound/Ulcer of the Lower Diabetic Wound/Ulcer of the Lower Primary Etiology: Extremity Extremity 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 Secondary Etiology: Hypertension, Type II Diabetes, Gout, Hypertension, Type II Diabetes, Gout, Hypertension, Type II Diabetes, Gout, Comorbid History: Neuropathy Neuropathy Neuropathy 07/24/2021 09/26/2021 07/24/2021 Date Acquired: 1 1 1  Weeks of Treatment: Open Open Open Wound Status: No No No Wound Recurrence: 1.3x1.3x0.1 2x2x0.1 0.1x0.1x0.1 Measurements L x W x D (cm) 1.327 3.142 0.008 A (cm) : rea 0.133 0.314 0.001 Volume (cm) : 78.30% -10.80% 94.90% % Reduction in A rea: 78.30% -10.60% 93.80% % Reduction in Volume: Grade 2 Grade 1 Grade 1 Classification: Medium Medium Medium Exudate A mount: Serosanguineous Serosanguineous Serosanguineous Exudate Type: red, brown red, brown red, brown Exudate Color: Flat and Intact Flat and Intact Flat and Intact Wound Margin: Large  (67-100%) Medium (34-66%) Large (67-100%) Granulation A mount: Red Red Red Granulation Quality: Small (1-33%) Medium (34-66%) None Present (0%) Necrotic A mount: Fat Layer (Subcutaneous Tissue): Yes Fat Layer (Subcutaneous Tissue): Yes Fat Layer (Subcutaneous Tissue): Yes Exposed Structures: Fascia: No Fascia: No Fascia: No Tendon: No Tendon: No Tendon: No Muscle: No Muscle: No Muscle: No Joint: No Joint: No Joint: No Bone: No Bone: No Bone: No Small (1-33%) Small (1-33%) Large (67-100%) Epithelialization: Debridement - Selective/Open Wound Debridement - Selective/Open Wound N/A Debridement: Pre-procedure Verification/Time Out 08:34 08:34 N/A Taken: Lidocaine 4% Topical Solution Lidocaine 4% Topical Solution N/A Pain Control: [employer] N/A Tissue Debrided: Non-Viable Tissue Non-Viable Tissue N/A Level: 1.69 4 N/A Debridement A (sq cm): rea Curette Curette N/A Instrument: Minimum Minimum N/A Bleeding: Pressure Pressure N/A Hemostasis A chieved: 1 1 N/A Procedural Pain: 0 0 N/A Post Procedural Pain: Procedure was tolerated well Procedure was tolerated well N/A Debridement Treatment Response: 1.3x1.3x0.1 2x2x0.1 N/A Post Debridement Measurements L x W x D (cm) 0.133 0.314 N/A Post Debridement Volume: (cm) Debridement Debridement N/A Procedures Performed: Treatment Notes Electronic Signature(s) Signed: 04/17/2022 8:37:28 AM By: Duanne Guess MD FACS Signed: 04/17/2022 5:44:10 PM By: Zenaida Deed RN, BSN Entered By: Duanne Guess on 04/17/2022 08:37:27 -------------------------------------------------------------------------------- Multi-Disciplinary Care Plan Details Patient Name: Date of Service: Robert Brennan, Robert Brennan 04/17/2022 8:15 A M Medical Record Number: [id number] Patient Account Number: [id number] Date of Birth/Sex: Treating RN: Dec 29, 1970 (51 y.o. [name] Primary Care Tabias Swayze: MA Phillis [place], [place] TTHEW Other Clinician: Referring  Isadora Delorey: Treating Mineola Duan/Extender: Duanne Guess [place], [place] TTHE[place] Weeks in Treatment: 1 Multidisciplinary Care Plan reviewed with physician Active Inactive Nutrition Nursing Diagnoses: Impaired glucose control: actual or potential Potential for alteratiion in Nutrition/Potential for imbalanced nutrition Goals: Patient/caregiver will maintain therapeutic glucose control Date Initiated: 04/11/2022 Target Resolution Date: 05/09/2022 Goal Status: Active Interventions: Assess HgA1c results as ordered upon admission and as needed Assess patient nutrition upon admission and as needed per policy Treatment Activities: Dietary management education, guidance and counseling : 04/10/2022 Giving encouragement to exercise : 04/10/2022 Patient referred to Primary Care Physician for further nutritional evaluation : 04/10/2022 Notes: Venous Leg Ulcer Nursing Diagnoses: Knowledge deficit related to disease process and management Potential for venous Insuffiency (use before diagnosis confirmed) Goals: Patient will maintain optimal edema control Date Initiated: 04/11/2022 Target Resolution Date: 05/09/2022 Goal Status: Active Interventions: Assess peripheral edema status every visit. Compression as ordered Provide education on venous insufficiency  Treatment Activities: Therapeutic compression applied : 04/10/2022 Notes: Wound/Skin Impairment Nursing Diagnoses: Impaired tissue integrity Knowledge deficit related to ulceration/compromised skin integrity Goals: Patient/caregiver will verbalize understanding of skin care regimen Date Initiated: 04/11/2022 Target Resolution Date: 05/09/2022 Goal Status: Active Ulcer/skin breakdown will have a volume reduction of 30% by week 4 Date Initiated: 04/11/2022 Target Resolution Date: 05/09/2022 Goal Status: Active Interventions: Assess patient/caregiver ability to obtain necessary supplies Assess patient/caregiver ability to perform ulcer/skin care  regimen upon admission and as needed Assess ulceration(s) every visit Provide education on ulcer and skin care Treatment Activities: Skin care regimen initiated : 04/10/2022 Topical wound management initiated : 04/10/2022 Notes: Electronic Signature(s) Signed: 04/17/2022 5:10:34 PM By: Blanche East RN Signed: 04/17/2022 5:44:10 PM By: Baruch Gouty RN, BSN Entered By: Blanche East on 04/17/2022 08:28:40 -------------------------------------------------------------------------------- Pain Assessment Details Patient Name: Date of Service: Robert Brennan, Robert Brennan 04/17/2022 8:15 A M Medical Record Number: [id number] Patient Account Number: [id number] Date of Birth/Sex: Treating RN: Nov 17, 1970 (52 y.o. [name] Primary Care Walida Cajas: MA Durenda [place], [place] TTHEW Other Clinician: Referring Keola Heninger: Treating Maryjane Benedict/Extender: Fredirick Maudlin MA [place], [place] TTHE[place] Weeks in Treatment: 1 Active Problems Location of Pain Severity and Description of Pain Patient Has Paino No Site Locations Rate the pain. Current Pain Level: 0 Pain Management and Medication Current Pain Management: Electronic Signature(s) Signed: 04/17/2022 5:10:34 PM By: Blanche East RN Signed: 04/17/2022 5:44:10 PM By: Baruch Gouty RN, BSN Entered By: Blanche East on 04/17/2022 08:13:32 -------------------------------------------------------------------------------- Patient/Caregiver Education Details Patient Name: Date of Service: Robert Brennan, Robert Brennan 7/24/2023andnbsp8:15 A M Medical Record Number: [id number] Patient Account Number: [id number] Date of Birth/Gender: Treating RN: [birth year] (51 y.o. [name] Primary Care Physician: MA Durenda [place], [place] TTHEW Other Clinician: Referring Physician: Treating Physician/Extender: Fredirick Maudlin MA [place], [place] TTHE[place] Weeks in Treatment: 1 Education Assessment Education Provided To: Patient Education Topics Provided Elevated Blood Sugar/ Impact on Healing: Methods:  Explain/Verbal Responses: Reinforcements needed, State content correctly Venous: Methods: Explain/Verbal Responses: Reinforcements needed, State content correctly Wound/Skin Impairment: Electronic Signature(s) Signed: 04/17/2022 5:10:34 PM By: Blanche East RN Entered By: Blanche East on 04/17/2022 08:30:12 -------------------------------------------------------------------------------- Wound Assessment Details Patient Name: Date of Service: Robert Brennan, Robert Brennan 04/17/2022 8:15 A M Medical Record Number: [id number] Patient Account Number: [id number] Date of Birth/Sex: Treating RN: 18-Jun-1971 (52 y.o. [name] Primary Care Kennard Fildes: MA Durenda [place], [place] TTHEW Other Clinician: Referring Shoji Pertuit: Treating Maleiah Dula/Extender: Fredirick Maudlin MA Duren[place], [place] TTHE[place] Weeks in Treatment: 1 Wound Status Wound Number: 1 Primary Etiology: Diabetic Wound/Ulcer of the Lower Extremity Wound Location: Right, Lateral Lower Leg Secondary Etiology: Venous Leg Ulcer Wounding Event: Trauma Wound Status: Open Date Acquired: 07/24/2021 Comorbid History: Hypertension, Type II Diabetes, Gout, Neuropathy Weeks Of Treatment: 1 Clustered Wound: No Photos Wound Measurements Length: (cm) 1.3 Width: (cm) 1.3 Depth: (cm) 0.1 Area: (cm) 1.327 Volume: (cm) 0.133 % Reduction in Area: 78.3% % Reduction in Volume: 78.3% Epithelialization: Small (1-33%) Tunneling: No Undermining: No Wound Description Classification: Grade 2 Wound Margin: Flat and Intact Exudate Amount: Medium Exudate Type: Serosanguineous Exudate Color: red, brown Foul Odor After Cleansing: No Slough/Fibrino Yes Wound Bed Granulation Amount: Large (67-100%) Exposed Structure Granulation Quality: Red Fascia Exposed: No Necrotic Amount: Small (1-33%) Fat Layer (Subcutaneous Tissue) Exposed: Yes Necrotic Quality: Adherent Slough Tendon Exposed: No Muscle Exposed: No Joint Exposed: No Bone Exposed: No Treatment Notes Wound #1 (Lower  Leg) Wound Laterality: Right, Lateral Cleanser Peri-Wound Care Sween Lotion (Moisturizing lotion) Discharge Instruction: Apply moisturizing lotion as directed Topical  Primary Dressing Promogran Prisma Matrix, 4.34 (sq in) (silver collagen) Discharge Instruction: Moisten collagen with saline or hydrogel Secondary Dressing Woven Gauze Sponge, Non-Sterile 4x4 in Discharge Instruction: Apply over primary dressing as directed. Secured With Compression Wrap ThreePress (3 layer compression wrap) Discharge Instruction: Apply three layer compression as directed. Compression Stockings Add-Ons Electronic Signature(s) Signed: 04/17/2022 5:44:10 PM By: Baruch Gouty RN, BSN Entered By: Baruch Gouty on 04/17/2022 08:24:16 -------------------------------------------------------------------------------- Wound Assessment Details Patient Name: Date of Service: Robert Brennan, Robert Brennan 04/17/2022 8:15 A M Medical Record Number: [id number] Patient Account Number: [id number] Date of Birth/Sex: Treating RN: 11-12-1970 (51 y.o. [name] Primary Care Reshawn Ostlund: MA Durenda [place], [place] TTHEW Other Clinician: Referring Jameica Couts: Treating Johnika Escareno/Extender: Fredirick Maudlin MA [place], [place] TTHE[place] Weeks in Treatment: 1 Wound Status Wound Number: 2 Primary Etiology: Diabetic Wound/Ulcer of the Lower Extremity Wound Location: Right, Distal, Lateral Lower Leg Secondary Etiology: Venous Leg Ulcer Wounding Event: Trauma Wound Status: Open Date Acquired: 09/26/2021 Comorbid History: Hypertension, Type II Diabetes, Gout, Neuropathy Weeks Of Treatment: 1 Clustered Wound: No Photos Wound Measurements Length: (cm) 2 Width: (cm) 2 Depth: (cm) 0.1 Area: (cm) 3.142 Volume: (cm) 0.314 % Reduction in Area: -10.8% % Reduction in Volume: -10.6% Epithelialization: Small (1-33%) Tunneling: No Undermining: No Wound Description Classification: Grade 1 Wound Margin: Flat and Intact Exudate Amount: Medium Exudate Type:  Serosanguineous Exudate Color: red, brown Foul Odor After Cleansing: No Slough/Fibrino Yes Wound Bed Granulation Amount: Medium (34-66%) Exposed Structure Granulation Quality: Red Fascia Exposed: No Necrotic Amount: Medium (34-66%) Fat Layer (Subcutaneous Tissue) Exposed: Yes Necrotic Quality: Adherent Slough Tendon Exposed: No Muscle Exposed: No Joint Exposed: No Bone Exposed: No Treatment Notes Wound #2 (Lower Leg) Wound Laterality: Right, Lateral, Distal Cleanser Peri-Wound Care Sween Lotion (Moisturizing lotion) Discharge Instruction: Apply moisturizing lotion as directed Topical Primary Dressing Promogran Prisma Matrix, 4.34 (sq in) (silver collagen) Discharge Instruction: Moisten collagen with saline or hydrogel Secondary Dressing Woven Gauze Sponge, Non-Sterile 4x4 in Discharge Instruction: Apply over primary dressing as directed. Secured With Compression Wrap ThreePress (3 layer compression wrap) Discharge Instruction: Apply three layer compression as directed. Compression Stockings Add-Ons Electronic Signature(s) Signed: 04/17/2022 5:44:10 PM By: Baruch Gouty RN, BSN Entered By: Baruch Gouty on 04/17/2022 08:24:47 -------------------------------------------------------------------------------- Wound Assessment Details Patient Name: Date of Service: Robert Brennan, Robert Brennan 04/17/2022 8:15 A M Medical Record Number: [id number] Patient Account Number: [id number] Date of Birth/Sex: Treating RN: 1971/06/27 (51 y.o. [name] Primary Care Theophile Harvie: MA Durenda [place], [place] TTHEW Other Clinician: Referring Callum Wolf: Treating Nivan Melendrez/Extender: Fredirick Maudlin MA [place], [place] TTHE[place] Weeks in Treatment: 1 Wound Status Wound Number: 3 Primary Etiology: Diabetic Wound/Ulcer of the Lower Extremity Wound Location: Right, Posterior Lower Leg Secondary Etiology: Venous Leg Ulcer Wounding Event: Gradually Appeared Wound Status: Open Date Acquired: 07/24/2021 Comorbid History:  Hypertension, Type II Diabetes, Gout, Neuropathy Weeks Of Treatment: 1 Clustered Wound: No Photos Wound Measurements Length: (cm) 0.1 Width: (cm) 0.1 Depth: (cm) 0.1 Area: (cm) 0.008 Volume: (cm) 0.001 % Reduction in Area: 94.9% % Reduction in Volume: 93.8% Epithelialization: Large (67-100%) Tunneling: No Undermining: No Wound Description Classification: Grade 1 Wound Margin: Flat and Intact Exudate Amount: Medium Exudate Type: Serosanguineous Exudate Color: red, brown Foul Odor After Cleansing: No Slough/Fibrino No Wound Bed Granulation Amount: Large (67-100%) Exposed Structure Granulation Quality: Red Fascia Exposed: No Necrotic Amount: None Present (0%) Fat Layer (Subcutaneous Tissue) Exposed: Yes Tendon Exposed: No Muscle Exposed: No Joint Exposed: No Bone Exposed: No Treatment Notes Wound #3 (Lower Leg) Wound Laterality: Right,  Posterior Cleanser Peri-Wound Care Sween Lotion (Moisturizing lotion) Discharge Instruction: Apply moisturizing lotion as directed Topical Primary Dressing Promogran Prisma Matrix, 4.34 (sq in) (silver collagen) Discharge Instruction: Moisten collagen with saline or hydrogel Secondary Dressing Woven Gauze Sponge, Non-Sterile 4x4 in Discharge Instruction: Apply over primary dressing as directed. Secured With Compression Wrap ThreePress (3 layer compression wrap) Discharge Instruction: Apply three layer compression as directed. Compression Stockings Add-Ons Electronic Signature(s) Signed: 04/17/2022 5:44:10 PM By: Zenaida Deed RN, BSN Entered By: Zenaida Deed on 04/17/2022 08:25:21 -------------------------------------------------------------------------------- Vitals Details Patient Name: Date of Service: Robert Brennan, Robert Brennan 04/17/2022 8:15 A M Medical Record Number: [id number] Patient Account Number: [id number] Date of Birth/Sex: Treating RN: 11-25-1970 (51 y.o. [name] Primary Care Nyema Hachey: MA Phillis [place], [place]  TTHEW Other Clinician: Referring Katelen Luepke: Treating Bricyn Labrada/Extender: Duanne Guess [place], [place] TTHE[place] Weeks in Treatment: 1 Vital Signs Time Taken: 08:10 Temperature (F): 100.0 Height (in): 72 Pulse (bpm): 122 Weight (lbs): 280 Respiratory Rate (breaths/min): 18 Body Mass Index (BMI): 38 Blood Pressure (mmHg): 152/87 Capillary Blood Glucose (mg/dl): 350 Reference Range: 80 - 120 mg / dl Electronic Signature(s) Signed: 04/17/2022 5:10:34 PM By: Tommie Ard RN Entered By: Tommie Ard on 04/17/2022 08:11:50

## 2022-04-17 NOTE — Progress Notes (Signed)
Robert Brennan ([id number]) Visit Report for 04/17/2022 Chief Complaint Document Details Patient Name: Date of Service: Robert Brennan, Robert Brennan 04/17/2022 8:15 A M Medical Record Number: [id number] Patient Account Number: [id number] Date of Birth/Sex: Treating RN: December 25, 1970 (51 y.o. Robert Brennan Primary Care Provider: MA Phillis Brennan, [place] Robert Brennan Other Clinician: Referring Provider: Treating Provider/Extender: Duanne Guess Robert Phillis Haggis, Robert Robert Brennan Weeks in Treatment: 1 Information Obtained from: Patient Chief Complaint Patient seen for complaints of Non-Healing Wound. Electronic Signature(s) Signed: 04/17/2022 8:37:34 AM By: Duanne Guess MD FACS Entered By: Duanne Brennan on 04/17/2022 08:37:33 -------------------------------------------------------------------------------- Debridement Details Patient Name: Date of Service: Robert Brennan, Robert Brennan 04/17/2022 8:15 A M Medical Record Number: [id number] Patient Account Number: [id number] Date of Birth/Sex: Treating RN: 10-20-1970 (51 y.o. Robert Brennan Primary Care Provider: MA Phillis Brennan, [place] Robert Brennan Other Clinician: Referring Provider: Treating Provider/Extender: Duanne Guess Robert Phillis Haggis, Robert Robert Brennan Weeks in Treatment: 1 Debridement Performed for Assessment: Wound #1 Right,Lateral Lower Leg Performed By: Physician Duanne Guess, MD Debridement Type: Debridement Severity of Tissue Pre Debridement: Fat layer exposed Level of Consciousness (Pre-procedure): Awake and Alert Pre-procedure Verification/Time Out Yes - 08:34 Taken: Start Time: 08:34 Pain Control: Lidocaine 4% T opical Solution T Area Debrided (L x W): otal 1.3 (cm) x 1.3 (cm) = 1.69 (cm) Tissue and other material debrided: Non-Viable, Slough, Slough Level: Non-Viable Tissue Debridement Description: Selective/Open Wound Instrument: Curette Bleeding: Minimum Hemostasis Achieved: Pressure Procedural Pain: 1 Post Procedural Pain: 0 Response to Treatment: Procedure was tolerated well Level of  Consciousness (Post- Awake and Alert procedure): Post Debridement Measurements of Total Wound Length: (cm) 1.3 Width: (cm) 1.3 Depth: (cm) 0.1 Volume: (cm) 0.133 Character of Wound/Ulcer Post Debridement: Improved Severity of Tissue Post Debridement: Fat layer exposed Post Procedure Diagnosis Same as Pre-procedure Electronic Signature(s) Signed: 04/17/2022 10:45:37 AM By: Duanne Guess MD FACS Signed: 04/17/2022 5:10:34 PM By: Tommie Ard RN Signed: 04/17/2022 5:44:10 PM By: Robert Deed RN, BSN Entered By: Tommie Brennan on 04/17/2022 08:36:10 -------------------------------------------------------------------------------- Debridement Details Patient Name: Date of Service: Robert Brennan, Robert Brennan 04/17/2022 8:15 A M Medical Record Number: [id number] Patient Account Number: [id number] Date of Birth/Sex: Treating RN: 1970-10-20 (51 y.o. Robert Brennan Primary Care Provider: MA Phillis Brennan, [place] Robert Brennan Other Clinician: Referring Provider: Treating Provider/Extender: Duanne Guess Robert Phillis Haggis, Robert Robert Brennan Weeks in Treatment: 1 Debridement Performed for Assessment: Wound #2 Right,Distal,Lateral Lower Leg Performed By: Physician Duanne Guess, MD Debridement Type: Debridement Severity of Tissue Pre Debridement: Fat layer exposed Level of Consciousness (Pre-procedure): Awake and Alert Pre-procedure Verification/Time Out Yes - 08:34 Taken: Start Time: 08:34 Pain Control: Lidocaine 4% T opical Solution T Area Debrided (L x W): otal 2 (cm) x 2 (cm) = 4 (cm) Tissue and other material debrided: Non-Viable, Slough, Slough Level: Non-Viable Tissue Debridement Description: Selective/Open Wound Instrument: Curette Bleeding: Minimum Hemostasis Achieved: Pressure Procedural Pain: 1 Post Procedural Pain: 0 Response to Treatment: Procedure was tolerated well Level of Consciousness (Post- Awake and Alert procedure): Post Debridement Measurements of Total Wound Length: (cm) 2 Width: (cm)  2 Depth: (cm) 0.1 Volume: (cm) 0.314 Character of Wound/Ulcer Post Debridement: Improved Severity of Tissue Post Debridement: Fat layer exposed Post Procedure Diagnosis Same as Pre-procedure Electronic Signature(s) Signed: 04/17/2022 10:45:37 AM By: Duanne Guess MD FACS Signed: 04/17/2022 5:10:34 PM By: Tommie Ard RN Signed: 04/17/2022 5:44:10 PM By: Robert Deed RN, BSN Entered By: Tommie Brennan on 04/17/2022 08:36:56 -------------------------------------------------------------------------------- HPI Details Patient Name: Date of Service: Robert Brennan, Robert Brennan 04/17/2022 8:15 A M Medical Record Number:  Robert Brennan Patient Account Number: [id number] Date of Birth/Sex: Treating RN: June 28, 1971 (51 y.o. Robert Brennan Primary Care Provider: MA Durenda Brennan, [place] Robert Brennan Other Clinician: Referring Provider: Treating Provider/Extender: Robert Maudlin Robert Durenda Guthrie, Robert Robert Brennan Weeks in Treatment: 1 History of Present Illness HPI Description: ADMISSION 04/10/2022 This is a 51 year old male with a past medical history significant for poorly controlled type 2 diabetes mellitus (hemoglobin A1c 11.8% in November 2022). He fell and struck his right anterior tibial surface in October or November 2022. He developed a hematoma but subsequently became infected. He underwent debridement on multiple occasions. Ultimately, he was left with a substantial defect that was skin grafted by plastic surgery. The skin graft has taken well but he still has some open tissue around the periphery. He had 2 new areas of breakdown occur secondary to drainage and moisture. Plastic surgery referred him to the wound care center for further evaluation and management. At the site of his skin graft, there has been excellent take, greater than 90%. Around the periphery, there is a thin rim of exposed fat layer with a bit of slough accumulation. There is granulation tissue forming. Just distal to this, there is a second wound that exposes  the fat layer with a fair amount of slough buildup. A similar, albeit smaller wound is located on the posterior leg. No concern for infection. He does have lymphedema pumps and says that he uses them about every other day. He had venous reflux studies done that were negative. I do not see any evidence of arterial studies. ABIs in clinic today were noncompressible but he has an easily palpable dorsalis pedis and posterior tibialis pulse. 04/17/2022: All of the wounds are little bit smaller today. There is a light layer of slough overlying the surfaces but underneath there is good granulation tissue. The posterior calf wound is nearly closed. Electronic Signature(s) Signed: 04/17/2022 8:38:07 AM By: Robert Maudlin MD FACS Entered By: Robert Brennan on 04/17/2022 08:38:07 -------------------------------------------------------------------------------- Physical Exam Details Patient Name: Date of Service: Robert Brennan, Robert Brennan 04/17/2022 8:15 A M Medical Record Number: Robert Brennan Patient Account Number: [id number] Date of Birth/Sex: Treating RN: 08-11-1971 (51 y.o. Robert Brennan Primary Care Provider: MA Durenda Brennan, [place] Robert Brennan Other Clinician: Referring Provider: Treating Provider/Extender: Robert Maudlin Robert Durenda Guthrie, Robert Robert Brennan Weeks in Treatment: 1 Constitutional Hypertensive, asymptomatic. . . Low-grade fever. Respiratory Normal work of breathing on room air.. Notes 04/17/2022: All of the wounds are little bit smaller today. There is a light layer of slough overlying the surfaces but underneath there is good granulation tissue. The posterior calf wound is nearly closed. Electronic Signature(s) Signed: 04/17/2022 8:39:47 AM By: Robert Maudlin MD FACS Entered By: Robert Brennan on 04/17/2022 08:39:47 -------------------------------------------------------------------------------- Physician Orders Details Patient Name: Date of Service: Robert Brennan, Robert Brennan 04/17/2022 8:15 A M Medical Record Number:  Robert Brennan Patient Account Number: [id number] Date of Birth/Sex: Treating RN: 1970/09/28 (51 y.o. Robert Brennan Primary Care Provider: MA Durenda Brennan, [place] Robert Brennan Other Clinician: Referring Provider: Treating Provider/Extender: Robert Maudlin Robert Durenda Guthrie, Robert Robert Brennan Weeks in Treatment: 1 Verbal / Phone Orders: No Diagnosis Coding ICD-10 Coding Code Description [phone] Non-pressure chronic ulcer of 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 exposed I89.0 Lymphedema, not elsewhere classified E11.622 Type 2 diabetes mellitus with other skin ulcer Follow-up Appointments ppointment in 1 week. - Dr. Celine Ahr RM 1 with Vaughan Basta Return A Monday 7/31 @ 2:45 pm Bathing/ Shower/ Hygiene May shower with protection but do not get wound dressing(s) wet.  Edema Control - Lymphedema / SCD / Other Lymphedema Pumps. Use Lymphedema pumps on leg(s) 2-3 times a day for 45-60 minutes. If wearing any wraps or hose, do not remove them. Continue exercising as instructed. - use once per day Elevate legs to the level of the heart or above for 30 minutes daily and/or when sitting, a frequency of: - throughout the day Avoid standing for long periods of time. Exercise regularly Compression stocking or Garment 20-30 mm/Hg pressure to: - left leg daily Wound Treatment Wound #1 - Lower Leg Wound Laterality: Right, Lateral Peri-Wound Care: Sween Lotion (Moisturizing lotion) 1 x Per Week/30 Days Discharge Instructions: Apply moisturizing lotion as directed Prim Dressing: Promogran Prisma Matrix, 4.34 (sq in) (silver collagen) 1 x Per Week/30 Days ary Discharge Instructions: Moisten collagen with saline or hydrogel Secondary Dressing: Woven Gauze Sponge, Non-Sterile 4x4 in 1 x Per Week/30 Days Discharge Instructions: Apply over primary dressing as directed. Compression Wrap: ThreePress (3 layer compression wrap) 1 x Per Week/30 Days Discharge Instructions: Apply three layer compression as directed. Wound #2 - Lower Leg Wound  Laterality: Right, Lateral, Distal Peri-Wound Care: Sween Lotion (Moisturizing lotion) 1 x Per Week/30 Days Discharge Instructions: Apply moisturizing lotion as directed Prim Dressing: Promogran Prisma Matrix, 4.34 (sq in) (silver collagen) 1 x Per Week/30 Days ary Discharge Instructions: Moisten collagen with saline or hydrogel Secondary Dressing: Woven Gauze Sponge, Non-Sterile 4x4 in 1 x Per Week/30 Days Discharge Instructions: Apply over primary dressing as directed. Compression Wrap: ThreePress (3 layer compression wrap) 1 x Per Week/30 Days Discharge Instructions: Apply three layer compression as directed. Wound #3 - Lower Leg Wound Laterality: Right, Posterior Peri-Wound Care: Sween Lotion (Moisturizing lotion) 1 x Per Week/30 Days Discharge Instructions: Apply moisturizing lotion as directed Prim Dressing: Promogran Prisma Matrix, 4.34 (sq in) (silver collagen) 1 x Per Week/30 Days ary Discharge Instructions: Moisten collagen with saline or hydrogel Secondary Dressing: Woven Gauze Sponge, Non-Sterile 4x4 in 1 x Per Week/30 Days Discharge Instructions: Apply over primary dressing as directed. Compression Wrap: ThreePress (3 layer compression wrap) 1 x Per Week/30 Days Discharge Instructions: Apply three layer compression as directed. Patient Medications llergies: No Known Allergies A Notifications Medication Indication Start End prior to debridement 04/17/2022 lidocaine DOSE topical 4 % cream - cream topical Electronic Signature(s) Signed: 04/17/2022 10:45:37 AM By: Robert Maudlin MD FACS Signed: 04/17/2022 5:10:34 PM By: Blanche East RN Entered By: Blanche East on 04/17/2022 08:55:55 -------------------------------------------------------------------------------- Problem List Details Patient Name: Date of Service: Robert Brennan, Robert Brennan 04/17/2022 8:15 A M Medical Record Number: Robert Brennan Patient Account Number: [id number] Date of Birth/Sex: Treating RN: Dec 28, 1970 (51 y.o. Robert Brennan Primary Care Provider: MA Durenda Brennan, [place] Robert Brennan Other Clinician: Referring Provider: Treating Provider/Extender: Robert Maudlin Robert Durenda Guthrie, Robert Robert Brennan Weeks in Treatment: 1 Active Problems ICD-10 Encounter Code Description Active Date MDM Diagnosis [phone] Non-pressure chronic ulcer of 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 04/10/2022 No Yes exposed I89.0 Lymphedema, not elsewhere classified 04/10/2022 No Yes E11.622 Type 2 diabetes mellitus with other skin ulcer 04/10/2022 No Yes Inactive Problems Resolved Problems Electronic Signature(s) Signed: 04/17/2022 8:37:17 AM By: Robert Maudlin MD FACS Entered By: Robert Brennan on 04/17/2022 08:37:17 -------------------------------------------------------------------------------- Progress Note Details Patient Name: Date of Service: Robert Brennan, Robert Brennan 04/17/2022 8:15 A M Medical Record Number: Robert Brennan Patient Account Number: [id number] Date of Birth/Sex: Treating RN: [birth year] (51 y.o. Robert Brennan Primary Care Provider: MA Durenda Brennan, [place] Robert Brennan Other Clinician: Referring Provider: Treating Provider/Extender: Robert Maudlin Robert  THIA S, Robert Robert Brennan Weeks in Treatment: 1 Subjective Chief Complaint Information obtained from Patient Patient seen for complaints of Non-Healing Wound. History of Present Illness (HPI) ADMISSION 04/10/2022 This is a 51 year old male with a past medical history significant for poorly controlled type 2 diabetes mellitus (hemoglobin A1c 11.8% in November 2022). He fell and struck his right anterior tibial surface in October or November 2022. He developed a hematoma but subsequently became infected. He underwent debridement on multiple occasions. Ultimately, he was left with a substantial defect that was skin grafted by plastic surgery. The skin graft has taken well but he still has some open tissue around the periphery. He had 2 new areas of breakdown occur secondary to drainage and moisture.  Plastic surgery referred him to the wound care center for further evaluation and management. At the site of his skin graft, there has been excellent take, greater than 90%. Around the periphery, there is a thin rim of exposed fat layer with a bit of slough accumulation. There is granulation tissue forming. Just distal to this, there is a second wound that exposes the fat layer with a fair amount of slough buildup. A similar, albeit smaller wound is located on the posterior leg. No concern for infection. He does have lymphedema pumps and says that he uses them about every other day. He had venous reflux studies done that were negative. I do not see any evidence of arterial studies. ABIs in clinic today were noncompressible but he has an easily palpable dorsalis pedis and posterior tibialis pulse. 04/17/2022: All of the wounds are little bit smaller today. There is a light layer of slough overlying the surfaces but underneath there is good granulation tissue. The posterior calf wound is nearly closed. Patient History Information obtained from Patient, Chart. Family History Cancer - Father,Paternal Grandparents, Diabetes - Maternal Grandparents,Father, Hypertension - Mother,Father, No family history of Heart Disease, Hereditary Spherocytosis, Kidney Disease, Lung Disease, Seizures, Stroke, Thyroid Problems, Tuberculosis. Social History Never smoker, Marital Status - Married, Alcohol Use - Never, Drug Use - No History, Caffeine Use - Moderate. Medical History Eyes Denies history of Cataracts, Glaucoma, Optic Neuritis Cardiovascular Patient has history of Hypertension Endocrine Patient has history of Type II Diabetes Denies history of Type I Diabetes Integumentary (Skin) Denies history of History of Burn Musculoskeletal Patient has history of Gout Neurologic Patient has history of Neuropathy Oncologic Denies history of Received Chemotherapy, Received Radiation Psychiatric Denies history of  Anorexia/bulimia, Confinement Anxiety Hospitalization/Surgery History - debridements of right leg ulcer. - split thickness skin graft right lower leg. Medical A Surgical History Notes nd Cardiovascular hyperlipidemia Objective Constitutional Hypertensive, asymptomatic. Low-grade fever. Vitals Time Taken: 8:10 AM, Height: 72 in, Weight: 280 lbs, BMI: 38, Temperature: 100.0 F, Pulse: 122 bpm, Respiratory Rate: 18 breaths/min, Blood Pressure: 152/87 mmHg, Capillary Blood Glucose: 112 mg/dl. Respiratory Normal work of breathing on room air.. General Notes: 04/17/2022: All of the wounds are little bit smaller today. There is a light layer of slough overlying the surfaces but underneath there is good granulation tissue. The posterior calf wound is nearly closed. Integumentary (Hair, Skin) Wound #1 status is Open. Original cause of wound was Trauma. The date acquired was: 07/24/2021. The wound has been in treatment 1 weeks. The wound is located on the Right,Lateral Lower Leg. The wound measures 1.3cm length x 1.3cm width x 0.1cm depth; 1.327cm^2 area and 0.133cm^3 volume. There is Fat Layer (Subcutaneous Tissue) exposed. There is no tunneling or undermining noted. There is a medium amount  of serosanguineous drainage noted. The wound margin is flat and intact. There is large (67-100%) red granulation within the wound bed. There is a small (1-33%) amount of necrotic tissue within the wound bed including Adherent Slough. Wound #2 status is Open. Original cause of wound was Trauma. The date acquired was: 09/26/2021. The wound has been in treatment 1 weeks. The wound is located on the Right,Distal,Lateral Lower Leg. The wound measures 2cm length x 2cm width x 0.1cm depth; 3.142cm^2 area and 0.314cm^3 volume. There is Fat Layer (Subcutaneous Tissue) exposed. There is no tunneling or undermining noted. There is a medium amount of serosanguineous drainage noted. The wound margin is flat and intact. There is  medium (34-66%) red granulation within the wound bed. There is a medium (34-66%) amount of necrotic tissue within the wound bed including Adherent Slough. Wound #3 status is Open. Original cause of wound was Gradually Appeared. The date acquired was: 07/24/2021. The wound has been in treatment 1 weeks. The wound is located on the Right,Posterior Lower Leg. The wound measures 0.1cm length x 0.1cm width x 0.1cm depth; 0.008cm^2 area and 0.001cm^3 volume. There is Fat Layer (Subcutaneous Tissue) exposed. There is no tunneling or undermining noted. There is a medium amount of serosanguineous drainage noted. The wound margin is flat and intact. There is large (67-100%) red granulation within the wound bed. There is no necrotic tissue within the wound bed. Assessment Active Problems ICD-10 Non-pressure chronic ulcer of 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 exposed Lymphedema, not elsewhere classified Type 2 diabetes mellitus with other skin ulcer Procedures Wound #1 Pre-procedure diagnosis of Wound #1 is a Diabetic Wound/Ulcer of the Lower Extremity located on the Right,Lateral Lower Leg .Severity of Tissue Pre Debridement is: Fat layer exposed. There was a Selective/Open Wound Non-Viable Tissue Debridement with a total area of 1.69 sq cm performed by Robert Maudlin, MD. With the following instrument(s): Curette to remove Non-Viable tissue/material. Material removed includes [facility] after achieving pain control using Lidocaine 4% T opical Solution. No specimens were taken. A time out was conducted at 08:34, prior to the start of the procedure. A Minimum amount of bleeding was controlled with Pressure. The procedure was tolerated well with a pain level of 1 throughout and a pain level of 0 following the procedure. Post Debridement Measurements: 1.3cm length x 1.3cm width x 0.1cm depth; 0.133cm^3 volume. Character of Wound/Ulcer Post Debridement is improved. Severity of Tissue Post Debridement  is: Fat layer exposed. Post procedure Diagnosis Wound #1: Same as Pre-Procedure Pre-procedure diagnosis of Wound #1 is a Diabetic Wound/Ulcer of the Lower Extremity located on the Right,Lateral Lower Leg . There was a Three Layer Compression Therapy Procedure by Baruch Gouty, RN. Post procedure Diagnosis Wound #1: Same as Pre-Procedure Wound #2 Pre-procedure diagnosis of Wound #2 is a Diabetic Wound/Ulcer of the Lower Extremity located on the Right,Distal,Lateral Lower Leg .Severity of Tissue Pre Debridement is: Fat layer exposed. There was a Selective/Open Wound Non-Viable Tissue Debridement with a total area of 4 sq cm performed by Robert Maudlin, MD. With the following instrument(s): Curette to remove Non-Viable tissue/material. Material removed includes [facility] after achieving pain control using Lidocaine 4% T opical Solution. No specimens were taken. A time out was conducted at 08:34, prior to the start of the procedure. A Minimum amount of bleeding was controlled with Pressure. The procedure was tolerated well with a pain level of 1 throughout and a pain level of 0 following the procedure. Post Debridement Measurements: 2cm length  x 2cm width x 0.1cm depth; 0.314cm^3 volume. Character of Wound/Ulcer Post Debridement is improved. Severity of Tissue Post Debridement is: Fat layer exposed. Post procedure Diagnosis Wound #2: Same as Pre-Procedure Plan 04/17/2022: All of the wounds are little bit smaller today. There is a light layer of slough overlying the surfaces but underneath there is good granulation tissue. The posterior calf wound is nearly closed. I used a curette to debride slough from the wound surface. We will continue using Prisma silver collagen with 3 layer compression. Follow-up in 1 week. Electronic Signature(s) Signed: 04/17/2022 8:50:03 AM By: Duanne Guess MD FACS Entered By: Duanne Brennan on 04/17/2022  08:50:03 -------------------------------------------------------------------------------- HxROS Details Patient Name: Date of Service: Robert Brennan, Robert Brennan 04/17/2022 8:15 A M Medical Record Number: [id number] Patient Account Number: [id number] Date of Birth/Sex: Treating RN: 10-14-1970 (51 y.o. Robert Brennan Primary Care Provider: Other Clinician: MA Phillis Haggis, Robert Robert Brennan Referring Provider: Treating Provider/Extender: Duanne Guess Robert Phillis Haggis, Robert Robert Brennan Weeks in Treatment: 1 Information Obtained From Patient Chart Eyes Medical History: Negative for: Cataracts; Glaucoma; Optic Neuritis Cardiovascular Medical History: Positive for: Hypertension Past Medical History Notes: hyperlipidemia Endocrine Medical History: Positive for: Type II Diabetes Negative for: Type I Diabetes Time with diabetes: 21 yrs Treated with: Insulin, Oral agents Blood sugar tested every day: No Integumentary (Skin) Medical History: Negative for: History of Burn Musculoskeletal Medical History: Positive for: Gout Neurologic Medical History: Positive for: Neuropathy Oncologic Medical History: Negative for: Received Chemotherapy; Received Radiation Psychiatric Medical History: Negative for: Anorexia/bulimia; Confinement Anxiety Immunizations Pneumococcal Vaccine: Received Pneumococcal Vaccination: Yes Received Pneumococcal Vaccination On or After 60th Birthday: No Implantable Devices No devices added Hospitalization / Surgery History Type of Hospitalization/Surgery debridements of right leg ulcer split thickness skin graft right lower leg Family and Social History Cancer: Yes - Father,Paternal Grandparents; Diabetes: Yes - Maternal Grandparents,Father; Heart Disease: No; Hereditary Spherocytosis: No; Hypertension: Yes - Mother,Father; Kidney Disease: No; Lung Disease: No; Seizures: No; Stroke: No; Thyroid Problems: No; Tuberculosis: No; Never smoker; Marital Status - Married; Alcohol Use: Never;  Drug Use: No History; Caffeine Use: Moderate; Financial Concerns: No; Food, Clothing or Shelter Needs: No; Support System Lacking: No; Transportation Concerns: No Psychologist, prison and probation services) Signed: 04/17/2022 10:45:37 AM By: Duanne Guess MD FACS Signed: 04/17/2022 5:44:10 PM By: Robert Deed RN, BSN Entered By: Duanne Brennan on 04/17/2022 08:38:12 -------------------------------------------------------------------------------- SuperBill Details Patient Name: Date of Service: Robert Brennan, Robert Brennan 04/17/2022 Medical Record Number: [id number] Patient Account Number: [id number] Date of Birth/Sex: Treating RN: 1971/02/13 (51 y.o. Robert Brennan Primary Care Provider: MA Phillis Brennan, [place] Robert Brennan Other Clinician: Referring Provider: Treating Provider/Extender: Duanne Guess Robert Phillis Haggis, Robert Robert Brennan Weeks in Treatment: 1 Diagnosis Coding ICD-10 Codes Code Description [phone] Non-pressure chronic ulcer of 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 exposed I89.0 Lymphedema, not elsewhere classified E11.622 Type 2 diabetes mellitus with other skin ulcer Facility Procedures CPT4 Code: [id number] Description: [phone] - DEBRIDE WOUND 1ST 20 SQ CM OR < ICD-10 Diagnosis Description 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 of 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 expos Modifier: ed Quantity: 1 Physician Procedures : CPT4 Code Description Modifier [id number] - WC PHYS LEVEL 3 - EST PT 25 ICD-10 Diagnosis Description 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 of 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 exposed I89.0 Lymphedema, not elsewhere classified E11.622 Type 2  diabetes mellitus with other skin ulcer Quantity: 1 : [phone] [id number] - WC PHYS DEBR WO ANESTH 20 SQ CM ICD-10 Diagnosis Description L97.812  Non-pressure chronic ulcer of 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 exposed Quantity: 1 Electronic Signature(s) Signed: 04/17/2022 8:50:32 AM By: Robert Maudlin MD FACS Entered By: Robert Brennan on  04/17/2022 08:50:31

## 2022-04-20 ENCOUNTER — Ambulatory Visit (INDEPENDENT_AMBULATORY_CARE_PROVIDER_SITE_OTHER): Payer: BC Managed Care – PPO | Admitting: Surgical

## 2022-04-20 DIAGNOSIS — Z945 Skin transplant status: Secondary | ICD-10-CM

## 2022-04-20 NOTE — Progress Notes (Signed)
Patient is a 51 year old male here for follow-up after split-thickness skin graft to his right ankle wound with Dr. Arita Miss on 03/07/2022.  He is 6 weeks postop.  He reports he is doing really well, feels as if the graft is healing very nicely.  He has been seeing wound care office every Monday and they have been applying Unna boots.  He feels as if the wound inferior to the skin graft is doing well.  On exam split-thickness skin graft is well-healed with the exception of a small opening at the inferior most portion that is approximately 3 x 3 mm.  It is beginning to scab over.  Split-thickness skin graft donor site is well-healed.    Patient's Unna boot was removed to evaluate skin graft, it is well-healed.  Recommend following up with wound care to continue to manage the wound inferior to the skin graft.  We applied some Vaseline followed by ABD pad, Kerlix and Coban wrapping to assist with swelling.  He is going to wound care to have his Unna boot reapplied on Monday  Pictures were obtained of the patient and placed in the chart with the patient's or guardian's permission.

## 2022-04-24 ENCOUNTER — Encounter (HOSPITAL_BASED_OUTPATIENT_CLINIC_OR_DEPARTMENT_OTHER): Payer: BC Managed Care – PPO | Admitting: General Surgery

## 2022-04-24 DIAGNOSIS — L97812 Non-pressure chronic ulcer of 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 exposed: Secondary | ICD-10-CM | POA: Diagnosis not present

## 2022-04-24 NOTE — Progress Notes (Addendum)
Zahki, Cypert Field ([id number]) Visit Report for 04/24/2022 Arrival Information Details Patient Name: Date of Service: Robert Brennan, Robert Brennan 04/24/2022 2:45 PM Medical Record Number: [id number] Patient Account Number: [id number] Date of Birth/Sex: Treating RN: 01-16-1971 (51 y.o. M) Primary Care Allison Silva: MA Durenda Guthrie, MA TTHEW Other Clinician: Referring Treylen Gibbs: Treating Uziah Sorter/Extender: Fredirick Maudlin MA Durenda Guthrie, MA TTHEW Weeks in Treatment: 2 Visit Information History Since Last Visit All ordered tests and consults were completed: Yes Patient Arrived: Ambulatory Added or deleted any medications: No Arrival Time: 14:55 Any new allergies or adverse reactions: No Accompanied By: self Had a fall or experienced change in No Transfer Assistance: None activities of daily living that may affect Patient Identification Verified: Yes risk of falls: Secondary Verification Process Completed: Yes Signs or symptoms of abuse/neglect since last visito No Patient Requires Transmission-Based Precautions: No Hospitalized since last visit: No Patient Has Alerts: No Implantable device outside of the clinic excluding No cellular tissue based products placed in the center since last visit: Has Dressing in Place as Prescribed: Yes Has Compression in Place as Prescribed: Yes Pain Present Now: No Electronic Signature(s) Signed: 04/24/2022 4:05:36 PM By: Blanche East RN Entered By: Blanche East on 04/24/2022 14:57:14 -------------------------------------------------------------------------------- Compression Therapy Details Patient Name: Date of Service: Robert Brennan, Robert Brennan 04/24/2022 2:45 PM Medical Record Number: [id number] Patient Account Number: [id number] Date of Birth/Sex: Treating RN: September 09, 1971 (51 y.o. M) Primary Care Emina Ribaudo: MA Durenda Guthrie, MA TTHEW Other Clinician: Referring Devine Dant: Treating Ernesto Zukowski/Extender: Fredirick Maudlin MA Durenda Guthrie, MA TTHEW Weeks in Treatment: 2 Compression Therapy Performed for Wound  Assessment: Wound #1 Right,Lateral Lower Leg Performed By: Clinician Baruch Gouty, RN Compression Type: Three Layer Post Procedure Diagnosis Same as Pre-procedure Electronic Signature(s) Signed: 04/24/2022 4:34:07 PM By: Blanche East RN Entered By: Blanche East on 04/24/2022 16:26:54 -------------------------------------------------------------------------------- Encounter Discharge Information Details Patient Name: Date of Service: Robert Brennan, Robert Brennan 04/24/2022 2:45 PM Medical Record Number: [id number] Patient Account Number: [id number] Date of Birth/Sex: Treating RN: July 23, 1971 (51 y.o. M) Primary Care Bushra Denman: MA Durenda Guthrie, MA TTHEW Other Clinician: Referring Eldena Dede: Treating Homero Hyson/Extender: Fredirick Maudlin MA Durenda Guthrie, MA TTHEW Weeks in Treatment: 2 Encounter Discharge Information Items Post Procedure Vitals Discharge Condition: Stable Temperature (F): 98.7 Ambulatory Status: Ambulatory Pulse (bpm): 103 Discharge Destination: Home Respiratory Rate (breaths/min): 18 Transportation: Private Auto Blood Pressure (mmHg): 147/99 Accompanied By: self Schedule Follow-up Appointment: Yes Clinical Summary of Care: Electronic Signature(s) Signed: 04/24/2022 4:05:36 PM By: Blanche East RN Entered By: Blanche East on 04/24/2022 15:28:55 -------------------------------------------------------------------------------- Lower Extremity Assessment Details Patient Name: Date of Service: Robert Brennan, Robert Brennan 04/24/2022 2:45 PM Medical Record Number: [id number] Patient Account Number: [id number] Date of Birth/Sex: Treating RN: 01-09-1971 (51 y.o. M) Primary Care Courtland Reas: MA Durenda Guthrie, MA TTHEW Other Clinician: Referring Romanita Fager: Treating Yashar Inclan/Extender: Fredirick Maudlin MA Durenda Guthrie, MA TTHEW Weeks in Treatment: 2 Edema Assessment Assessed: [Left: No] [Right: No] Edema: [Left: Ye] [Right: s] Calf Left: Right: Point of Measurement: From Medial Instep 40 cm Ankle Left: Right: Point of  Measurement: From Medial Instep 25 cm Vascular Assessment Pulses: Dorsalis Pedis Palpable: [Right:Yes] Electronic Signature(s) Signed: 04/24/2022 4:05:36 PM By: Blanche East RN Entered By: Blanche East on 04/24/2022 15:06:15 -------------------------------------------------------------------------------- Multi Wound Chart Details Patient Name: Date of Service: Robert Brennan, Robert Brennan 04/24/2022 2:45 PM Medical Record Number: [id number] Patient Account Number: [id number] Date of Birth/Sex: Treating RN: [birth year] (51 y.o. M) Primary Care Teven Mittman: MA Durenda Guthrie, MA TTHEW Other Clinician: Referring Dezmon Conover: Treating Joab Carden/Extender: Fredirick Maudlin MA Durenda Guthrie, MA TTHEW Weeks in  Treatment: 2 Vital Signs Height(in): 72 Capillary Blood Glucose(mg/dl): 826 Weight(lbs): 415 Pulse(bpm): 103 Body Mass Index(BMI): 38 Blood Pressure(mmHg): 147/99 Temperature(F): 98.7 Respiratory Rate(breaths/min): 18 Photos: Right, Lateral Lower Leg Right, Distal, Lateral Lower Leg Right, Posterior Lower Leg Wound Location: Trauma Trauma Gradually Appeared Wounding Event: Diabetic Wound/Ulcer of the Lower Diabetic Wound/Ulcer of the Lower Diabetic Wound/Ulcer of the Lower Primary Etiology: Extremity Extremity 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 Secondary Etiology: Hypertension, Type II Diabetes, Gout, Hypertension, Type II Diabetes, Gout, Hypertension, Type II Diabetes, Gout, Comorbid History: Neuropathy Neuropathy Neuropathy 07/24/2021 09/26/2021 07/24/2021 Date Acquired: 2 2 2  Weeks of Treatment: Open Open Open Wound Status: No No No Wound Recurrence: 1x1x0.1 1.8x1.9x0.1 0x0x0 Measurements L x W x D (cm) 0.785 2.686 0 A (cm) : rea 0.079 0.269 0 Volume (cm) : 87.20% 5.30% 100.00% % Reduction in A rea: 87.10% 5.30% 100.00% % Reduction in Volume: Grade 2 Grade 1 Grade 1 Classification: Medium Medium None Present Exudate A mount: Serosanguineous Serosanguineous  N/A Exudate Type: red, brown red, brown N/A Exudate Color: Flat and Intact Flat and Intact Flat and Intact Wound Margin: Large (67-100%) Medium (34-66%) None Present (0%) Granulation A mount: Red Red N/A Granulation Quality: Small (1-33%) Medium (34-66%) None Present (0%) Necrotic A mount: Fat Layer (Subcutaneous Tissue): Yes Fat Layer (Subcutaneous Tissue): Yes Fat Layer (Subcutaneous Tissue): Yes Exposed Structures: Fascia: No Fascia: No Fascia: No Tendon: No Tendon: No Tendon: No Muscle: No Muscle: No Muscle: No Joint: No Joint: No Joint: No Bone: No Bone: No Bone: No Small (1-33%) Small (1-33%) None Epithelialization: Treatment Notes Electronic Signature(s) Signed: 04/24/2022 3:22:47 PM By: 04/26/2022 MD FACS Entered By: Duanne Guess on 04/24/2022 15:22:47 -------------------------------------------------------------------------------- Multi-Disciplinary Care Plan Details Patient Name: Date of Service: 04/26/2022, Robert Brennan 04/24/2022 2:45 PM Medical Record Number: 04/26/2022 Patient Account Number: [id number] Date of Birth/Sex: Treating RN: 25-Aug-1971 (51 y.o. M) Primary Care Lichelle Viets: MA 42, MA TTHEW Other Clinician: Referring Ruba Outen: Treating Laira Penninger/Extender: Phillis Haggis MA Duanne Guess, MA TTHEW Weeks in Treatment: 2 Multidisciplinary Care Plan reviewed with physician Active Inactive Nutrition Nursing Diagnoses: Impaired glucose control: actual or potential Potential for alteratiion in Nutrition/Potential for imbalanced nutrition Goals: Patient/caregiver will maintain therapeutic glucose control Date Initiated: 04/11/2022 Target Resolution Date: 05/09/2022 Goal Status: Active Interventions: Assess HgA1c results as ordered upon admission and as needed Assess patient nutrition upon admission and as needed per policy Treatment Activities: Dietary management education, guidance and counseling : 04/10/2022 Giving encouragement to exercise :  04/10/2022 Patient referred to Primary Care Physician for further nutritional evaluation : 04/10/2022 Notes: Venous Leg Ulcer Nursing Diagnoses: Knowledge deficit related to disease process and management Potential for venous Insuffiency (use before diagnosis confirmed) Goals: Patient will maintain optimal edema control Date Initiated: 04/11/2022 Target Resolution Date: 05/09/2022 Goal Status: Active Interventions: Assess peripheral edema status every visit. Compression as ordered Provide education on venous insufficiency Treatment Activities: Therapeutic compression applied : 04/10/2022 Notes: Wound/Skin Impairment Nursing Diagnoses: Impaired tissue integrity Knowledge deficit related to ulceration/compromised skin integrity Goals: Patient/caregiver will verbalize understanding of skin care regimen Date Initiated: 04/11/2022 Target Resolution Date: 05/09/2022 Goal Status: Active Ulcer/skin breakdown will have a volume reduction of 30% by week 4 Date Initiated: 04/11/2022 Target Resolution Date: 05/09/2022 Goal Status: Active Interventions: Assess patient/caregiver ability to obtain necessary supplies Assess patient/caregiver ability to perform ulcer/skin care regimen upon admission and as needed Assess ulceration(s) every visit Provide education on ulcer and skin care Treatment Activities: Skin care regimen initiated :  04/10/2022 Topical wound management initiated : 04/10/2022 Notes: Electronic Signature(s) Signed: 04/24/2022 4:05:36 PM By: Tommie Ard RN Entered By: Tommie Ard on 04/24/2022 15:16:02 -------------------------------------------------------------------------------- Pain Assessment Details Patient Name: Date of Service: Robert Brennan, Robert Brennan 04/24/2022 2:45 PM Medical Record Number: [id number] Patient Account Number: [id number] Date of Birth/Sex: Treating RN: 1971/04/24 (51 y.o. M) Primary Care Alisi Lupien: MA Phillis Haggis, MA TTHEW Other Clinician: Referring  Naelle Diegel: Treating Esperansa Sarabia/Extender: Duanne Guess MA Phillis Haggis, MA TTHEW Weeks in Treatment: 2 Active Problems Location of Pain Severity and Description of Pain Patient Has Paino No Site Locations Rate the pain. Current Pain Level: 0 Pain Management and Medication Current Pain Management: Electronic Signature(s) Signed: 04/24/2022 4:05:36 PM By: Tommie Ard RN Entered By: Tommie Ard on 04/24/2022 14:59:03 -------------------------------------------------------------------------------- Patient/Caregiver Education Details Patient Name: Date of Service: Robert Brennan, Robert Brennan 7/31/2023andnbsp2:45 PM Medical Record Number: [id number] Patient Account Number: [id number] Date of Birth/Gender: Treating RN: [birth year] (51 y.o. M) Primary Care Physician: MA Phillis Haggis, MA TTHEW Other Clinician: Referring Physician: Treating Physician/Extender: Duanne Guess MA Phillis Haggis, MA TTHEW Weeks in Treatment: 2 Education Assessment Education Provided To: Patient Education Topics Provided Venous: Methods: Explain/Verbal Responses: Reinforcements needed, State content correctly Wound/Skin Impairment: Methods: Explain/Verbal Responses: Reinforcements needed, State content correctly Electronic Signature(s) Signed: 04/24/2022 4:05:36 PM By: Tommie Ard RN Entered By: Tommie Ard on 04/24/2022 15:16:25 -------------------------------------------------------------------------------- Wound Assessment Details Patient Name: Date of Service: Robert Brennan, Robert Brennan 04/24/2022 2:45 PM Medical Record Number: [id number] Patient Account Number: [id number] Date of Birth/Sex: Treating RN: 1971/07/06 (51 y.o. M) Primary Care Trajan Grove: MA Phillis Haggis, MA TTHEW Other Clinician: Referring Corrion Stirewalt: Treating Diarra Ceja/Extender: Duanne Guess MA Phillis Haggis, MA TTHEW Weeks in Treatment: 2 Wound Status Wound Number: 1 Primary Etiology: Diabetic Wound/Ulcer of the Lower Extremity Wound Location: Right, Lateral Lower Leg Secondary  Etiology: Venous Leg Ulcer Wounding Event: Trauma Wound Status: Open Date Acquired: 07/24/2021 Comorbid History: Hypertension, Type II Diabetes, Gout, Neuropathy Weeks Of Treatment: 2 Clustered Wound: No Photos Wound Measurements Length: (cm) 1 Width: (cm) 1 Depth: (cm) 0.1 Area: (cm) 0.785 Volume: (cm) 0.079 Wound Description Classification: Grade 2 Wound Margin: Flat and Intact Exudate Amount: Medium Exudate Type: Serosanguineous Exudate Color: red, brown Foul Odor After Cleansing: Slough/Fibrino % Reduction in Area: 87.2% % Reduction in Volume: 87.1% Epithelialization: Small (1-33%) Tunneling: No Undermining: No No Yes Wound Bed Granulation Amount: Large (67-100%) Exposed Structure Granulation Quality: Red Fascia Exposed: No Necrotic Amount: Small (1-33%) Fat Layer (Subcutaneous Tissue) Exposed: Yes Necrotic Quality: Adherent Slough Tendon Exposed: No Muscle Exposed: No Joint Exposed: No Bone Exposed: No Treatment Notes Wound #1 (Lower Leg) Wound Laterality: Right, Lateral Cleanser Peri-Wound Care Sween Lotion (Moisturizing lotion) Discharge Instruction: Apply moisturizing lotion as directed Topical Primary Dressing Promogran Prisma Matrix, 4.34 (sq in) (silver collagen) Discharge Instruction: Moisten collagen with saline or hydrogel Secondary Dressing Woven Gauze Sponge, Non-Sterile 4x4 in Discharge Instruction: Apply over primary dressing as directed. Secured With Compression Wrap ThreePress (3 layer compression wrap) Discharge Instruction: Apply three layer compression as directed. Compression Stockings Add-Ons Electronic Signature(s) Signed: 04/24/2022 4:05:36 PM By: Tommie Ard RN Entered By: Tommie Ard on 04/24/2022 15:12:27 -------------------------------------------------------------------------------- Wound Assessment Details Patient Name: Date of Service: Robert Brennan, Robert Brennan 04/24/2022 2:45 PM Medical Record Number: [id number] Patient  Account Number: [id number] Date of Birth/Sex: Treating RN: 1971-07-12 (52 y.o. M) Primary Care [place] Nol[place]: MA Phillis Haggis, MA TTHEW Other Clinician: Referring Chandel Zaun: Treating Nafeesah Lapaglia/Extender: Duanne Guess MA THIA S, MA TTHEW Weeks in Treatment: 2 Wound Status Wound Number: 2 Primary Etiology:  Diabetic Wound/Ulcer of the Lower Extremity Wound Location: Right, Distal, Lateral Lower Leg Secondary Etiology: Venous Leg Ulcer Wounding Event: Trauma Wound Status: Open Date Acquired: 09/26/2021 Comorbid History: Hypertension, Type II Diabetes, Gout, Neuropathy Weeks Of Treatment: 2 Clustered Wound: No Photos Wound Measurements Length: (cm) 1.8 Width: (cm) 1.9 Depth: (cm) 0.1 Area: (cm) 2.686 Volume: (cm) 0.269 % Reduction in Area: 5.3% % Reduction in Volume: 5.3% Epithelialization: Small (1-33%) Tunneling: No Undermining: No Wound Description Classification: Grade 1 Wound Margin: Flat and Intact Exudate Amount: Medium Exudate Type: Serosanguineous Exudate Color: red, brown Foul Odor After Cleansing: No Slough/Fibrino Yes Wound Bed Granulation Amount: Medium (34-66%) Exposed Structure Granulation Quality: Red Fascia Exposed: No Necrotic Amount: Medium (34-66%) Fat Layer (Subcutaneous Tissue) Exposed: Yes Necrotic Quality: Adherent Slough Tendon Exposed: No Muscle Exposed: No Joint Exposed: No Bone Exposed: No Treatment Notes Wound #2 (Lower Leg) Wound Laterality: Right, Lateral, Distal Cleanser Peri-Wound Care Sween Lotion (Moisturizing lotion) Discharge Instruction: Apply moisturizing lotion as directed Topical Primary Dressing Promogran Prisma Matrix, 4.34 (sq in) (silver collagen) Discharge Instruction: Moisten collagen with saline or hydrogel Secondary Dressing Woven Gauze Sponge, Non-Sterile 4x4 in Discharge Instruction: Apply over primary dressing as directed. Secured With Compression Wrap ThreePress (3 layer compression wrap) Discharge Instruction:  Apply three layer compression as directed. Compression Stockings Add-Ons Electronic Signature(s) Signed: 04/24/2022 4:05:36 PM By: Tommie Ard RN Entered By: Tommie Ard on 04/24/2022 15:12:55 -------------------------------------------------------------------------------- Wound Assessment Details Patient Name: Date of Service: Robert Brennan, Robert Brennan 04/24/2022 2:45 PM Medical Record Number: [id number] Patient Account Number: [id number] Date of Birth/Sex: Treating RN: 1970/11/13 (51 y.o. M) Primary Care Tianni Escamilla: MA Phillis Haggis, MA TTHEW Other Clinician: Referring Santonio Speakman: Treating Magic Mohler/Extender: Duanne Guess MA Phillis Haggis, MA TTHEW Weeks in Treatment: 2 Wound Status Wound Number: 3 Primary Etiology: Diabetic Wound/Ulcer of the Lower Extremity Wound Location: Right, Posterior Lower Leg Secondary Etiology: Venous Leg Ulcer Wounding Event: Gradually Appeared Wound Status: Open Date Acquired: 07/24/2021 Comorbid History: Hypertension, Type II Diabetes, Gout, Neuropathy Weeks Of Treatment: 2 Clustered Wound: No Photos Wound Measurements Length: (cm) Width: (cm) Depth: (cm) Area: (cm) Volume: (cm) 0 % Reduction in Area: 100% 0 % Reduction in Volume: 100% 0 Epithelialization: None 0 0 Wound Description Classification: Grade 1 Wound Margin: Flat and Intact Exudate Amount: None Present Foul Odor After Cleansing: No Slough/Fibrino No Wound Bed Granulation Amount: None Present (0%) Exposed Structure Necrotic Amount: None Present (0%) Fascia Exposed: No Fat Layer (Subcutaneous Tissue) Exposed: Yes Tendon Exposed: No Muscle Exposed: No Joint Exposed: No Bone Exposed: No Electronic Signature(s) Signed: 04/24/2022 4:05:36 PM By: Tommie Ard RN Entered By: Tommie Ard on 04/24/2022 15:13:21 -------------------------------------------------------------------------------- Vitals Details Patient Name: Date of Service: Robert Brennan, Robert Brennan 04/24/2022 2:45 PM Medical Record Number:  [id number] Patient Account Number: [id number] Date of Birth/Sex: Treating RN: 07-26-1971 (51 y.o. M) Primary Care Candia Kingsbury: MA Phillis Haggis, MA TTHEW Other Clinician: Referring Samwise Eckardt: Treating Marti Acebo/Extender: Duanne Guess MA Phillis Haggis, MA TTHEW Weeks in Treatment: 2 Vital Signs Time Taken: 14:57 Temperature (F): 98.7 Height (in): 72 Pulse (bpm): 103 Weight (lbs): 280 Respiratory Rate (breaths/min): 18 Body Mass Index (BMI): 38 Blood Pressure (mmHg): 147/99 Capillary Blood Glucose (mg/dl): 562 Reference Range: 80 - 120 mg / dl Electronic Signature(s) Signed: 04/24/2022 4:05:36 PM By: Tommie Ard RN Entered By: Tommie Ard on 04/24/2022 14:57:45

## 2022-04-24 NOTE — Progress Notes (Signed)
Robert Brennan ([id number]) Visit Report for 04/24/2022 Chief Complaint Document Details Patient Name: Date of Service: Robert Brennan, Robert Brennan 04/24/2022 2:45 PM Medical Record Number: [id number] Patient Account Number: [id number] Date of Birth/Sex: Treating RN: 1971-03-12 (51 y.o. M) Primary Care Provider: MA Durenda Guthrie, MA TTHEW Other Clinician: Referring Provider: Treating Provider/Extender: Fredirick Maudlin MA Durenda Guthrie, MA TTHEW Weeks in Treatment: 2 Information Obtained from: Patient Chief Complaint Patient seen for complaints of Non-Healing Wound. Electronic Signature(s) Signed: 04/24/2022 3:22:52 PM By: Fredirick Maudlin MD FACS Entered By: Fredirick Maudlin on 04/24/2022 15:22:52 -------------------------------------------------------------------------------- Debridement Details Patient Name: Date of Service: Robert Brennan, Robert Brennan 04/24/2022 2:45 PM Medical Record Number: [id number] Patient Account Number: [id number] Date of Birth/Sex: Treating RN: 1971-08-22 (51 y.o. M) Primary Care Provider: MA Durenda Guthrie, MA TTHEW Other Clinician: Referring Provider: Treating Provider/Extender: Fredirick Maudlin MA Durenda Guthrie, MA TTHEW Weeks in Treatment: 2 Debridement Performed for Assessment: Wound #2 Right,Distal,Lateral Lower Leg Performed By: Physician Fredirick Maudlin, MD Debridement Type: Debridement Severity of Tissue Pre Debridement: Fat layer exposed Level of Consciousness (Pre-procedure): Awake and Alert Pre-procedure Verification/Time Out Yes - 15:18 Taken: Start Time: 15:19 Pain Control: Lidocaine 4% T opical Solution T Area Debrided (L x W): otal 1.8 (cm) x 1.9 (cm) = 3.42 (cm) Tissue and other material debrided: Non-Viable, Slough, Slough Level: Non-Viable Tissue Debridement Description: Selective/Open Wound Instrument: Curette Bleeding: Minimum Hemostasis Achieved: Pressure Procedural Pain: 0 Post Procedural Pain: 0 Response to Treatment: Procedure was tolerated well Level of Consciousness (Post- Awake  and Alert procedure): Post Debridement Measurements of Total Wound Length: (cm) 1.8 Width: (cm) 1.9 Depth: (cm) 0.1 Volume: (cm) 0.269 Character of Wound/Ulcer Post Debridement: Improved Severity of Tissue Post Debridement: Fat layer exposed Post Procedure Diagnosis Same as Pre-procedure Electronic Signature(s) Signed: 04/24/2022 3:32:30 PM By: Fredirick Maudlin MD FACS Signed: 04/24/2022 4:05:36 PM By: Blanche East RN Entered By: Blanche East on 04/24/2022 15:23:39 -------------------------------------------------------------------------------- HPI Details Patient Name: Date of Service: Robert Brennan, Robert Brennan 04/24/2022 2:45 PM Medical Record Number: [id number] Patient Account Number: [id number] Date of Birth/Sex: Treating RN: 11-16-1970 (51 y.o. M) Primary Care Provider: MA Durenda Guthrie, MA TTHEW Other Clinician: Referring Provider: Treating Provider/Extender: Fredirick Maudlin MA Durenda Guthrie, MA TTHEW Weeks in Treatment: 2 History of Present Illness HPI Description: ADMISSION 04/10/2022 This is a 51 year old male with a past medical history significant for poorly controlled type 2 diabetes mellitus (hemoglobin A1c 11.8% in November 2022). He fell and struck his right anterior tibial surface in October or November 2022. He developed a hematoma but subsequently became infected. He underwent debridement on multiple occasions. Ultimately, he was left with a substantial defect that was skin grafted by plastic surgery. The skin graft has taken well but he still has some open tissue around the periphery. He had 2 new areas of breakdown occur secondary to drainage and moisture. Plastic surgery referred him to the wound care center for further evaluation and management. At the site of his skin graft, there has been excellent take, greater than 90%. Around the periphery, there is a thin rim of exposed fat layer with a bit of slough accumulation. There is granulation tissue forming. Just distal to this, there is a  second wound that exposes the fat layer with a fair amount of slough buildup. A similar, albeit smaller wound is located on the posterior leg. No concern for infection. He does have lymphedema pumps and says that he uses them about every other day. He had venous reflux studies done that were negative. I do  not see any evidence of arterial studies. ABIs in clinic today were noncompressible but he has an easily palpable dorsalis pedis and posterior tibialis pulse. 04/17/2022: All of the wounds are little bit smaller today. There is a light layer of slough overlying the surfaces but underneath there is good granulation tissue. The posterior calf wound is nearly closed. 04/24/2022: The posterior wound has closed. Both of the other wounds are smaller today. There is hypertrophic granulation tissue on the more proximal anterior tibial wound and a light layer of slough on the more distal wound. No concern for infection. Electronic Signature(s) Signed: 04/24/2022 3:23:23 PM By: Duanne Guess MD FACS Entered By: Duanne Guess on 04/24/2022 15:23:23 -------------------------------------------------------------------------------- Chemical Cauterization Details Patient Name: Date of Service: Robert Brennan, Robert Brennan 04/24/2022 2:45 PM Medical Record Number: [id number] Patient Account Number: [id number] Date of Birth/Sex: Treating RN: [birth year] (51 y.o. M) Primary Care Provider: MA Phillis Haggis, MA TTHEW Other Clinician: Referring Provider: Treating Provider/Extender: Duanne Guess MA Phillis Haggis, MA TTHEW Weeks in Treatment: 2 Procedure Performed for: Wound #1 Right,Lateral Lower Leg Performed By: Physician Duanne Guess, MD Post Procedure Diagnosis Same as Pre-procedure Electronic Signature(s) Signed: 04/24/2022 3:32:30 PM By: Duanne Guess MD FACS Signed: 04/24/2022 4:05:36 PM By: Tommie Ard RN Entered By: Tommie Ard on 04/24/2022  15:24:15 -------------------------------------------------------------------------------- Physical Exam Details Patient Name: Date of Service: Robert Brennan, Robert Brennan 04/24/2022 2:45 PM Medical Record Number: [id number] Patient Account Number: [id number] Date of Birth/Sex: Treating RN: [birth year] (51 y.o. M) Primary Care Provider: MA Phillis Haggis, MA TTHEW Other Clinician: Referring Provider: Treating Provider/Extender: Duanne Guess MA THIA S, MA TTHEW Weeks in Treatment: 2 Constitutional Hypertensive, asymptomatic. Slightly tachycardic, asymptomatic. . . No acute distress.Marland Kitchen Respiratory Normal work of breathing on room air.. Notes 04/24/2022: The posterior wound has closed. Both of the other wounds are smaller today. There is hypertrophic granulation tissue on the more proximal anterior tibial wound and a light layer of slough on the more distal wound. No concern for infection. Electronic Signature(s) Signed: 04/24/2022 3:23:58 PM By: Duanne Guess MD FACS Entered By: Duanne Guess on 04/24/2022 15:23:58 -------------------------------------------------------------------------------- Physician Orders Details Patient Name: Date of Service: Robert Brennan, Robert Brennan 04/24/2022 2:45 PM Medical Record Number: [id number] Patient Account Number: [id number] Date of Birth/Sex: Treating RN: 10/17/1970 (51 y.o. M) Primary Care Provider: MA Phillis Haggis, MA TTHEW Other Clinician: Referring Provider: Treating Provider/Extender: Duanne Guess MA Phillis Haggis, MA TTHEW Weeks in Treatment: 2 Verbal / Phone Orders: No Diagnosis Coding ICD-10 Coding Code Description [phone] Non-pressure chronic ulcer of 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 exposed I89.0 Lymphedema, not elsewhere classified E11.622 Type 2 diabetes mellitus with other skin ulcer Follow-up Appointments ppointment in 1 week. - Dr. Lady Gary RM 1 with [facility] Return A Wednesday 05/02/22 @ 08:30am Bathing/ Shower/ Hygiene May shower with protection but do  not get wound dressing(s) wet. Edema Control - Lymphedema / SCD / Other Lymphedema Pumps. Use Lymphedema pumps on leg(s) 2-3 times a day for 45-60 minutes. If wearing any wraps or hose, do not remove them. Continue exercising as instructed. - use once per day Elevate legs to the level of the heart or above for 30 minutes daily and/or when sitting, a frequency of: - throughout the day Avoid standing for long periods of time. Exercise regularly Compression stocking or Garment 20-30 mm/Hg pressure to: - left leg daily Wound Treatment Wound #1 - Lower Leg Wound Laterality: Right, Lateral Peri-Wound Care: Sween Lotion (Moisturizing lotion) 1 x Per Week/30 Days Discharge Instructions: Apply  moisturizing lotion as directed Prim Dressing: Promogran Prisma Matrix, 4.34 (sq in) (silver collagen) 1 x Per Week/30 Days ary Discharge Instructions: Moisten collagen with saline or hydrogel Secondary Dressing: Woven Gauze Sponge, Non-Sterile 4x4 in 1 x Per Week/30 Days Discharge Instructions: Apply over primary dressing as directed. Compression Wrap: ThreePress (3 layer compression wrap) 1 x Per Week/30 Days Discharge Instructions: Apply three layer compression as directed. Wound #2 - Lower Leg Wound Laterality: Right, Lateral, Distal Peri-Wound Care: Sween Lotion (Moisturizing lotion) 1 x Per Week/30 Days Discharge Instructions: Apply moisturizing lotion as directed Prim Dressing: Promogran Prisma Matrix, 4.34 (sq in) (silver collagen) 1 x Per Week/30 Days ary Discharge Instructions: Moisten collagen with saline or hydrogel Secondary Dressing: Woven Gauze Sponge, Non-Sterile 4x4 in 1 x Per Week/30 Days Discharge Instructions: Apply over primary dressing as directed. Compression Wrap: ThreePress (3 layer compression wrap) 1 x Per Week/30 Days Discharge Instructions: Apply three layer compression as directed. Wound #3 - Lower Leg Wound Laterality: Right, Posterior Peri-Wound Care: Sween Lotion  (Moisturizing lotion) 1 x Per Week/30 Days Discharge Instructions: Apply moisturizing lotion as directed Prim Dressing: Promogran Prisma Matrix, 4.34 (sq in) (silver collagen) 1 x Per Week/30 Days ary Discharge Instructions: Moisten collagen with saline or hydrogel Secondary Dressing: Woven Gauze Sponge, Non-Sterile 4x4 in 1 x Per Week/30 Days Discharge Instructions: Apply over primary dressing as directed. Compression Wrap: ThreePress (3 layer compression wrap) 1 x Per Week/30 Days Discharge Instructions: Apply three layer compression as directed. Electronic Signature(s) Signed: 04/24/2022 3:32:30 PM By: Duanne Guess MD FACS Signed: 04/24/2022 4:05:36 PM By: Tommie Ard RN Previous Signature: 04/24/2022 3:24:12 PM Version By: Duanne Guess MD FACS Entered By: Tommie Ard on 04/24/2022 15:26:29 -------------------------------------------------------------------------------- Problem List Details Patient Name: Date of Service: Robert Brennan, Robert Brennan 04/24/2022 2:45 PM Medical Record Number: [id number] Patient Account Number: [id number] Date of Birth/Sex: Treating RN: 10-14-1970 (51 y.o. M) Primary Care Provider: MA Phillis Haggis, MA TTHEW Other Clinician: Referring Provider: Treating Provider/Extender: Duanne Guess MA Phillis Haggis, MA TTHEW Weeks in Treatment: 2 Active Problems ICD-10 Encounter Code Description Active Date MDM Diagnosis 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 of 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 04/10/2022 No Yes exposed I89.0 Lymphedema, not elsewhere classified 04/10/2022 No Yes E11.622 Type 2 diabetes mellitus with other skin ulcer 04/10/2022 No Yes Inactive Problems Resolved Problems Electronic Signature(s) Signed: 04/24/2022 3:22:33 PM By: Duanne Guess MD FACS Entered By: Duanne Guess on 04/24/2022 15:22:32 -------------------------------------------------------------------------------- Progress Note Details Patient Name: Date of Service: Robert Brennan, Robert Brennan  04/24/2022 2:45 PM Medical Record Number: [id number] Patient Account Number: [id number] Date of Birth/Sex: Treating RN: [birth year] (51 y.o. M) Primary Care Provider: MA Phillis Haggis, MA TTHEW Other Clinician: Referring Provider: Treating Provider/Extender: Duanne Guess MA Phillis Haggis, MA TTHEW Weeks in Treatment: 2 Subjective Chief Complaint Information obtained from Patient Patient seen for complaints of Non-Healing Wound. History of Present Illness (HPI) ADMISSION 04/10/2022 This is a 51 year old male with a past medical history significant for poorly controlled type 2 diabetes mellitus (hemoglobin A1c 11.8% in November 2022). He fell and struck his right anterior tibial surface in October or November 2022. He developed a hematoma but subsequently became infected. He underwent debridement on multiple occasions. Ultimately, he was left with a substantial defect that was skin grafted by plastic surgery. The skin graft has taken well but he still has some open tissue around the periphery. He had 2 new areas of breakdown occur secondary to drainage and moisture. Plastic surgery referred him to the  wound care center for further evaluation and management. At the site of his skin graft, there has been excellent take, greater than 90%. Around the periphery, there is a thin rim of exposed fat layer with a bit of slough accumulation. There is granulation tissue forming. Just distal to this, there is a second wound that exposes the fat layer with a fair amount of slough buildup. A similar, albeit smaller wound is located on the posterior leg. No concern for infection. He does have lymphedema pumps and says that he uses them about every other day. He had venous reflux studies done that were negative. I do not see any evidence of arterial studies. ABIs in clinic today were noncompressible but he has an easily palpable dorsalis pedis and posterior tibialis pulse. 04/17/2022: All of the wounds are little bit smaller  today. There is a light layer of slough overlying the surfaces but underneath there is good granulation tissue. The posterior calf wound is nearly closed. 04/24/2022: The posterior wound has closed. Both of the other wounds are smaller today. There is hypertrophic granulation tissue on the more proximal anterior tibial wound and a light layer of slough on the more distal wound. No concern for infection. Patient History Information obtained from Patient, Chart. Family History Cancer - Father,Paternal Grandparents, Diabetes - Maternal Grandparents,Father, Hypertension - Mother,Father, No family history of Heart Disease, Hereditary Spherocytosis, Kidney Disease, Lung Disease, Seizures, Stroke, Thyroid Problems, Tuberculosis. Social History Never smoker, Marital Status - Married, Alcohol Use - Never, Drug Use - No History, Caffeine Use - Moderate. Medical History Eyes Denies history of Cataracts, Glaucoma, Optic Neuritis Cardiovascular Patient has history of Hypertension Endocrine Patient has history of Type II Diabetes Denies history of Type I Diabetes Integumentary (Skin) Denies history of History of Burn Musculoskeletal Patient has history of Gout Neurologic Patient has history of Neuropathy Oncologic Denies history of Received Chemotherapy, Received Radiation Psychiatric Denies history of Anorexia/bulimia, Confinement Anxiety Hospitalization/Surgery History - debridements of right leg ulcer. - split thickness skin graft right lower leg. Medical A Surgical History Notes nd Cardiovascular hyperlipidemia Objective Constitutional Hypertensive, asymptomatic. Slightly tachycardic, asymptomatic. No acute distress.. Vitals Time Taken: 2:57 PM, Height: 72 in, Weight: 280 lbs, BMI: 38, Temperature: 98.7 F, Pulse: 103 bpm, Respiratory Rate: 18 breaths/min, Blood Pressure: 147/99 mmHg, Capillary Blood Glucose: 120 mg/dl. Respiratory Normal work of breathing on room air.. General Notes:  04/24/2022: The posterior wound has closed. Both of the other wounds are smaller today. There is hypertrophic granulation tissue on the more proximal anterior tibial wound and a light layer of slough on the more distal wound. No concern for infection. Integumentary (Hair, Skin) Wound #1 status is Open. Original cause of wound was Trauma. The date acquired was: 07/24/2021. The wound has been in treatment 2 weeks. The wound is located on the Right,Lateral Lower Leg. The wound measures 1cm length x 1cm width x 0.1cm depth; 0.785cm^2 area and 0.079cm^3 volume. There is Fat Layer (Subcutaneous Tissue) exposed. There is no tunneling or undermining noted. There is a medium amount of serosanguineous drainage noted. The wound margin is flat and intact. There is large (67-100%) red granulation within the wound bed. There is a small (1-33%) amount of necrotic tissue within the wound bed including Adherent Slough. Wound #2 status is Open. Original cause of wound was Trauma. The date acquired was: 09/26/2021. The wound has been in treatment 2 weeks. The wound is located on the Right,Distal,Lateral Lower Leg. The wound measures 1.8cm length x 1.9cm width x  0.1cm depth; 2.686cm^2 area and 0.269cm^3 volume. There is Fat Layer (Subcutaneous Tissue) exposed. There is no tunneling or undermining noted. There is a medium amount of serosanguineous drainage noted. The wound margin is flat and intact. There is medium (34-66%) red granulation within the wound bed. There is a medium (34-66%) amount of necrotic tissue within the wound bed including Adherent Slough. Wound #3 status is Open. Original cause of wound was Gradually Appeared. The date acquired was: 07/24/2021. The wound has been in treatment 2 weeks. The wound is located on the Right,Posterior Lower Leg. The wound measures 0cm length x 0cm width x 0cm depth; 0cm^2 area and 0cm^3 volume. There is Fat Layer (Subcutaneous Tissue) exposed. There is a none present amount of  drainage noted. The wound margin is flat and intact. There is no granulation within the wound bed. There is no necrotic tissue within the wound bed. Assessment Active Problems ICD-10 Non-pressure chronic ulcer of 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 exposed Lymphedema, not elsewhere classified Type 2 diabetes mellitus with other skin ulcer Procedures Wound #2 Pre-procedure diagnosis of Wound #2 is a Diabetic Wound/Ulcer of the Lower Extremity located on the Right,Distal,Lateral Lower Leg .Severity of Tissue Pre Debridement is: Fat layer exposed. There was a Selective/Open Wound Non-Viable Tissue Debridement with a total area of 3.42 sq cm performed by Fredirick Maudlin, MD. With the following instrument(s): Curette to remove Non-Viable tissue/material. Material removed includes [facility] after achieving pain control using Lidocaine 4% T opical Solution. No specimens were taken. A time out was conducted at 15:18, prior to the start of the procedure. A Minimum amount of bleeding was controlled with Pressure. The procedure was tolerated well with a pain level of 0 throughout and a pain level of 0 following the procedure. Post Debridement Measurements: 1.8cm length x 1.9cm width x 0.1cm depth; 0.269cm^3 volume. Character of Wound/Ulcer Post Debridement is improved. Severity of Tissue Post Debridement is: Fat layer exposed. Post procedure Diagnosis Wound #2: Same as Pre-Procedure Wound #1 Pre-procedure diagnosis of Wound #1 is a Diabetic Wound/Ulcer of the Lower Extremity located on the Right,Lateral Lower Leg . There was a Three Layer Compression Therapy Procedure by Baruch Gouty, RN. Post procedure Diagnosis Wound #1: Same as Pre-Procedure Pre-procedure diagnosis of Wound #1 is a Diabetic Wound/Ulcer of the Lower Extremity located on the Right,Lateral Lower Leg . An Chemical Cauterization procedure was performed by Fredirick Maudlin, MD. Post procedure Diagnosis Wound #1: Same as  Pre-Procedure Plan Follow-up Appointments: Return Appointment in 1 week. - Dr. Celine Ahr RM 1 with [facility] Wednesday 05/02/22 @ 08:30am Bathing/ Shower/ Hygiene: May shower with protection but do not get wound dressing(s) wet. Edema Control - Lymphedema / SCD / Other: Lymphedema Pumps. Use Lymphedema pumps on leg(s) 2-3 times a day for 45-60 minutes. If wearing any wraps or hose, do not remove them. Continue exercising as instructed. - use once per day Elevate legs to the level of the heart or above for 30 minutes daily and/or when sitting, a frequency of: - throughout the day Avoid standing for long periods of time. Exercise regularly Compression stocking or Garment 20-30 mm/Hg pressure to: - left leg daily WOUND #1: - Lower Leg Wound Laterality: Right, Lateral Peri-Wound Care: Sween Lotion (Moisturizing lotion) 1 x Per Week/30 Days Discharge Instructions: Apply moisturizing lotion as directed Prim Dressing: Promogran Prisma Matrix, 4.34 (sq in) (silver collagen) 1 x Per Week/30 Days ary Discharge Instructions: Moisten collagen with saline or hydrogel Secondary Dressing: Woven Gauze Sponge,  Non-Sterile 4x4 in 1 x Per Week/30 Days Discharge Instructions: Apply over primary dressing as directed. Com pression Wrap: ThreePress (3 layer compression wrap) 1 x Per Week/30 Days Discharge Instructions: Apply three layer compression as directed. WOUND #2: - Lower Leg Wound Laterality: Right, Lateral, Distal Peri-Wound Care: Sween Lotion (Moisturizing lotion) 1 x Per Week/30 Days Discharge Instructions: Apply moisturizing lotion as directed Prim Dressing: Promogran Prisma Matrix, 4.34 (sq in) (silver collagen) 1 x Per Week/30 Days ary Discharge Instructions: Moisten collagen with saline or hydrogel Secondary Dressing: Woven Gauze Sponge, Non-Sterile 4x4 in 1 x Per Week/30 Days Discharge Instructions: Apply over primary dressing as directed. Com pression Wrap: ThreePress (3 layer compression wrap) 1 x  Per Week/30 Days Discharge Instructions: Apply three layer compression as directed. WOUND #3: - Lower Leg Wound Laterality: Right, Posterior Peri-Wound Care: Sween Lotion (Moisturizing lotion) 1 x Per Week/30 Days Discharge Instructions: Apply moisturizing lotion as directed Prim Dressing: Promogran Prisma Matrix, 4.34 (sq in) (silver collagen) 1 x Per Week/30 Days ary Discharge Instructions: Moisten collagen with saline or hydrogel Secondary Dressing: Woven Gauze Sponge, Non-Sterile 4x4 in 1 x Per Week/30 Days Discharge Instructions: Apply over primary dressing as directed. Com pression Wrap: ThreePress (3 layer compression wrap) 1 x Per Week/30 Days Discharge Instructions: Apply three layer compression as directed. 04/24/2022: The posterior wound has closed. Both of the other wounds are smaller today. There is hypertrophic granulation tissue on the more proximal anterior tibial wound and a light layer of slough on the more distal wound. No concern for infection. I used a curette to debride slough from the distal wound and silver nitrate to chemically cauterize the hypertrophic granulation tissue on the proximal wound. We will continue using silver alginate and 3 layer compression. Follow-up in 1 week. Electronic Signature(s) Signed: 04/24/2022 4:34:07 PM By: Blanche East RN Signed: 04/25/2022 7:36:24 AM By: Fredirick Maudlin MD FACS Previous Signature: 04/24/2022 3:24:37 PM Version By: Fredirick Maudlin MD FACS Entered By: Blanche East on 04/24/2022 16:27:14 -------------------------------------------------------------------------------- HxROS Details Patient Name: Date of Service: Robert Brennan, Robert Brennan 04/24/2022 2:45 PM Medical Record Number: [id number] Patient Account Number: [id number] Date of Birth/Sex: Treating RN: [birth year] (51 y.o. M) Primary Care Provider: MA Durenda Guthrie, MA TTHEW Other Clinician: Referring Provider: Treating Provider/Extender: Fredirick Maudlin MA Durenda Guthrie, MA TTHEW Weeks in  Treatment: 2 Information Obtained From Patient Chart Eyes Medical History: Negative for: Cataracts; Glaucoma; Optic Neuritis Cardiovascular Medical History: Positive for: Hypertension Past Medical History Notes: hyperlipidemia Endocrine Medical History: Positive for: Type II Diabetes Negative for: Type I Diabetes Time with diabetes: 21 yrs Treated with: Insulin, Oral agents Blood sugar tested every day: No Integumentary (Skin) Medical History: Negative for: History of Burn Musculoskeletal Medical History: Positive for: Gout Neurologic Medical History: Positive for: Neuropathy Oncologic Medical History: Negative for: Received Chemotherapy; Received Radiation Psychiatric Medical History: Negative for: Anorexia/bulimia; Confinement Anxiety Immunizations Pneumococcal Vaccine: Received Pneumococcal Vaccination: Yes Received Pneumococcal Vaccination On or After 60th Birthday: No Implantable Devices No devices added Hospitalization / Surgery History Type of Hospitalization/Surgery debridements of right leg ulcer split thickness skin graft right lower leg Family and Social History Cancer: Yes - Father,Paternal Grandparents; Diabetes: Yes - Maternal Grandparents,Father; Heart Disease: No; Hereditary Spherocytosis: No; Hypertension: Yes - Mother,Father; Kidney Disease: No; Lung Disease: No; Seizures: No; Stroke: No; Thyroid Problems: No; Tuberculosis: No; Never smoker; Marital Status - Married; Alcohol Use: Never; Drug Use: No History; Caffeine Use: Moderate; Financial Concerns: No; Food, Clothing or Shelter Needs: No; Support System Lacking: No; Transportation  Concerns: No Electronic Signature(s) Signed: 04/24/2022 3:32:30 PM By: Fredirick Maudlin MD FACS Entered By: Fredirick Maudlin on 04/24/2022 15:23:29 -------------------------------------------------------------------------------- SuperBill Details Patient Name: Date of Service: Robert Brennan, Robert Brennan 04/24/2022 Medical Record  Number: [id number] Patient Account Number: [id number] Date of Birth/Sex: Treating RN: 1971/04/13 (51 y.o. M) Primary Care Provider: MA Durenda Guthrie, MA TTHEW Other Clinician: Referring Provider: Treating Provider/Extender: Fredirick Maudlin MA Durenda Guthrie, MA TTHEW Weeks in Treatment: 2 Diagnosis Coding ICD-10 Codes Code Description [phone] Non-pressure chronic ulcer of 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 exposed I89.0 Lymphedema, not elsewhere classified E11.622 Type 2 diabetes mellitus with other skin ulcer Facility Procedures CPT4 Code: [id number] Description: [phone] - DEBRIDE WOUND 1ST 20 SQ CM OR < ICD-10 Diagnosis Description [id number] Non-pressure chronic ulcer of 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 expos Modifier: ed Quantity: 1 CPT4 Code: [id number] Description: [id number] - CHEM CAUT GRANULATION TISS ICD-10 Diagnosis Description 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 of 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 expos Modifier: ed Quantity: 1 Physician Procedures : CPT4 Code Description Modifier [id number] - WC PHYS LEVEL 3 - EST PT 25 ICD-10 Diagnosis Description 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 of 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 exposed I89.0 Lymphedema, not elsewhere classified E11.622 Type 2  diabetes mellitus with other skin ulcer Quantity: 1 : [id number] - WC PHYS DEBR WO ANESTH 20 SQ CM ICD-10 Diagnosis Description [id number] Non-pressure chronic ulcer of 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 exposed Quantity: 1 : [id number] - WC PHYS CHEM CAUT GRAN TISSUE ICD-10 Diagnosis Description [id number] Non-pressure chronic ulcer of 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 exposed Quantity: 1 Electronic Signature(s) Signed: 04/24/2022 4:34:07 PM By: Blanche East RN Signed: 04/25/2022 7:36:24 AM By: Fredirick Maudlin MD FACS Previous Signature: 04/24/2022 3:25:03 PM Version By: Fredirick Maudlin MD FACS Entered By: Blanche East on 04/24/2022 16:27:23

## 2022-05-02 ENCOUNTER — Encounter (HOSPITAL_BASED_OUTPATIENT_CLINIC_OR_DEPARTMENT_OTHER): Payer: BC Managed Care – PPO | Attending: General Surgery | Admitting: General Surgery

## 2022-05-02 DIAGNOSIS — E1151 Type 2 diabetes mellitus with diabetic peripheral angiopathy without gangrene: Secondary | ICD-10-CM | POA: Diagnosis not present

## 2022-05-02 DIAGNOSIS — L97812 Non-pressure chronic ulcer of 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 exposed: Secondary | ICD-10-CM | POA: Diagnosis present

## 2022-05-02 DIAGNOSIS — I890 Lymphedema, not elsewhere classified: Secondary | ICD-10-CM | POA: Diagnosis not present

## 2022-05-02 DIAGNOSIS — E11622 Type 2 diabetes mellitus with other skin ulcer: Secondary | ICD-10-CM | POA: Insufficient documentation

## 2022-05-02 DIAGNOSIS — I10 Essential (primary) hypertension: Secondary | ICD-10-CM | POA: Insufficient documentation

## 2022-05-02 DIAGNOSIS — E1140 Type 2 diabetes mellitus with diabetic neuropathy, unspecified: Secondary | ICD-10-CM | POA: Insufficient documentation

## 2022-05-02 DIAGNOSIS — E11621 Type 2 diabetes mellitus with foot ulcer: Secondary | ICD-10-CM | POA: Diagnosis not present

## 2022-05-02 DIAGNOSIS — E785 Hyperlipidemia, unspecified: Secondary | ICD-10-CM | POA: Diagnosis not present

## 2022-05-02 NOTE — Progress Notes (Signed)
Tripp, Goins Demere ([id number]) Visit Report for 05/02/2022 Chief Complaint Document Details Patient Name: Date of Service: Robert Brennan, Robert Brennan 05/02/2022 8:30 A M Medical Record Number: [id number] Patient Account Number: [id number] Date of Birth/Sex: Treating RN: 11/06/1970 (51 y.o. Marlan Palau Primary Care Provider: MA Phillis Haggis, [place] TTHEW Other Clinician: Referring Provider: Treating Provider/Extender: Duanne Guess MA Phillis Haggis, MA TTHEW Weeks in Treatment: 3 Information Obtained from: Patient Chief Complaint Patient seen for complaints of Non-Healing Wound. Electronic Signature(s) Signed: 05/02/2022 9:06:58 AM By: Duanne Guess MD FACS Entered By: Duanne Guess on 05/02/2022 09:06:58 -------------------------------------------------------------------------------- Debridement Details Patient Name: Date of Service: Robert Brennan, Robert Brennan 05/02/2022 8:30 A M Medical Record Number: [id number] Patient Account Number: [id number] Date of Birth/Sex: Treating RN: [birth year] (51 y.o. Marlan Palau Primary Care Provider: MA Phillis Haggis, [place] TTHEW Other Clinician: Referring Provider: Treating Provider/Extender: Duanne Guess MA Phillis Haggis, MA TTHEW Weeks in Treatment: 3 Debridement Performed for Assessment: Wound #2 Right,Distal,Lateral Lower Leg Performed By: Physician Duanne Guess, MD Debridement Type: Debridement Severity of Tissue Pre Debridement: Fat layer exposed Level of Consciousness (Pre-procedure): Awake and Alert Pre-procedure Verification/Time Out Yes - 08:47 Taken: Start Time: 08:47 Pain Control: Lidocaine 4% T opical Solution T Area Debrided (L x W): otal 1.6 (cm) x 1.6 (cm) = 2.56 (cm) Tissue and other material debrided: Non-Viable, Eschar, Slough, Subcutaneous, Slough Level: Skin/Subcutaneous Tissue Debridement Description: Excisional Instrument: Curette Bleeding: Minimum Hemostasis Achieved: Pressure Procedural Pain: 0 Post Procedural Pain: 0 Response to Treatment: Procedure  was tolerated well Level of Consciousness (Post- Awake and Alert procedure): Post Debridement Measurements of Total Wound Length: (cm) 1.6 Width: (cm) 1.6 Depth: (cm) 0.1 Volume: (cm) 0.201 Character of Wound/Ulcer Post Debridement: Improved Severity of Tissue Post Debridement: Fat layer exposed Post Procedure Diagnosis Same as Pre-procedure Electronic Signature(s) Signed: 05/02/2022 9:59:22 AM By: Duanne Guess MD FACS Signed: 05/02/2022 4:38:24 PM By: Samuella Bruin Entered By: Samuella Bruin on 05/02/2022 08:48:31 -------------------------------------------------------------------------------- Debridement Details Patient Name: Date of Service: Robert Brennan, Robert Brennan 05/02/2022 8:30 A M Medical Record Number: [id number] Patient Account Number: [id number] Date of Birth/Sex: Treating RN: 1971/08/06 (51 y.o. Marlan Palau Primary Care Provider: MA Phillis Haggis, [place] TTHEW Other Clinician: Referring Provider: Treating Provider/Extender: Duanne Guess MA Phillis Haggis, MA TTHEW Weeks in Treatment: 3 Debridement Performed for Assessment: Wound #1 Right,Lateral Lower Leg Performed By: Physician Duanne Guess, MD Debridement Type: Debridement Severity of Tissue Pre Debridement: Fat layer exposed Level of Consciousness (Pre-procedure): Awake and Alert Pre-procedure Verification/Time Out Yes - 08:47 Taken: Start Time: 08:47 Pain Control: Lidocaine 4% T opical Solution T Area Debrided (L x W): otal 0.4 (cm) x 0.4 (cm) = 0.16 (cm) Tissue and other material debrided: Non-Viable, Eschar Level: Non-Viable Tissue Debridement Description: Selective/Open Wound Instrument: Curette Bleeding: Minimum Hemostasis Achieved: Pressure Procedural Pain: 0 Post Procedural Pain: 0 Response to Treatment: Procedure was tolerated well Level of Consciousness (Post- Awake and Alert procedure): Post Debridement Measurements of Total Wound Length: (cm) 0.4 Width: (cm) 0.4 Depth: (cm) 0.1 Volume: (cm)  0.013 Character of Wound/Ulcer Post Debridement: Improved Severity of Tissue Post Debridement: Fat layer exposed Post Procedure Diagnosis Same as Pre-procedure Electronic Signature(s) Signed: 05/02/2022 9:59:22 AM By: Duanne Guess MD FACS Signed: 05/02/2022 4:38:24 PM By: Samuella Bruin Entered By: Samuella Bruin on 05/02/2022 08:51:05 -------------------------------------------------------------------------------- HPI Details Patient Name: Date of Service: Robert Brennan, Robert Brennan 05/02/2022 8:30 A M Medical Record Number: [id number] Patient Account Number: [id number] Date of Birth/Sex: Treating RN: [birth year] (51 y.o. Marlan Palau Primary Care Provider: MA  Phillis Haggis, MA TTHEW Other Clinician: Referring Provider: Treating Provider/Extender: Duanne Guess MA Phillis Haggis, MA TTHEW Weeks in Treatment: 3 History of Present Illness HPI Description: ADMISSION 04/10/2022 This is a 51 year old male with a past medical history significant for poorly controlled type 2 diabetes mellitus (hemoglobin A1c 11.8% in November 2022). He fell and struck his right anterior tibial surface in October or November 2022. He developed a hematoma but subsequently became infected. He underwent debridement on multiple occasions. Ultimately, he was left with a substantial defect that was skin grafted by plastic surgery. The skin graft has taken well but he still has some open tissue around the periphery. He had 2 new areas of breakdown occur secondary to drainage and moisture. Plastic surgery referred him to the wound care center for further evaluation and management. At the site of his skin graft, there has been excellent take, greater than 90%. Around the periphery, there is a thin rim of exposed fat layer with a bit of slough accumulation. There is granulation tissue forming. Just distal to this, there is a second wound that exposes the fat layer with a fair amount of slough buildup. A similar, albeit smaller wound  is located on the posterior leg. No concern for infection. He does have lymphedema pumps and says that he uses them about every other day. He had venous reflux studies done that were negative. I do not see any evidence of arterial studies. ABIs in clinic today were noncompressible but he has an easily palpable dorsalis pedis and posterior tibialis pulse. 04/17/2022: All of the wounds are little bit smaller today. There is a light layer of slough overlying the surfaces but underneath there is good granulation tissue. The posterior calf wound is nearly closed. 04/24/2022: The posterior wound has closed. Both of the other wounds are smaller today. There is hypertrophic granulation tissue on the more proximal anterior tibial wound and a light layer of slough on the more distal wound. No concern for infection. 05/02/2022: The proximal anterior tibial wound initially appeared to be closed, but there is a small opening underneath the eschar that was present. The more distal wound has contracted and has a light layer of slough with perimeter eschar. No concern for infection. Electronic Signature(s) Signed: 05/02/2022 9:07:48 AM By: Duanne Guess MD FACS Entered By: Duanne Guess on 05/02/2022 09:07:48 -------------------------------------------------------------------------------- Physical Exam Details Patient Name: Date of Service: Robert Brennan, Robert Brennan 05/02/2022 8:30 A M Medical Record Number: [id number] Patient Account Number: [id number] Date of Birth/Sex: Treating RN: 22-Oct-1970 (51 y.o. Marlan Palau Primary Care Provider: MA Phillis Haggis, [place] TTHEW Other Clinician: Referring Provider: Treating Provider/Extender: Duanne Guess MA THIA S, MA TTHEW Weeks in Treatment: 3 Constitutional Slightly hypertensive. Slightly tachycardic, asymptomatic. . . No acute distress.Marland Kitchen Respiratory Normal work of breathing on room air.. Notes 05/02/2022: The proximal anterior tibial wound initially appeared to be closed,  but there is a small opening underneath the eschar that was present. The more distal wound has contracted and has a light layer of slough with perimeter eschar. No concern for infection. Electronic Signature(s) Signed: 05/02/2022 9:08:20 AM By: Duanne Guess MD FACS Entered By: Duanne Guess on 05/02/2022 09:08:20 -------------------------------------------------------------------------------- Physician Orders Details Patient Name: Date of Service: Robert Brennan, Robert Brennan 05/02/2022 8:30 A M Medical Record Number: [id number] Patient Account Number: [id number] Date of Birth/Sex: Treating RN: July 16, 1971 (51 y.o. Marlan Palau Primary Care Provider: MA Phillis Haggis, [place] TTHEW Other Clinician: Referring Provider: Treating Provider/Extender: Duanne Guess MA Phillis Haggis, [place] TTHEW Tania Ade  in Treatment: 3 Verbal / Phone Orders: No Diagnosis Coding ICD-10 Coding Code Description 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 of 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 exposed I89.0 Lymphedema, not elsewhere classified E11.622 Type 2 diabetes mellitus with other skin ulcer Follow-up Appointments ppointment in 1 week. - Dr. Lady Gary RM 1 with [facility] Return A Wednesday 05/09/22 @ 08:15am Bathing/ Shower/ Hygiene May shower with protection but do not get wound dressing(s) wet. Edema Control - Lymphedema / SCD / Other Lymphedema Pumps. Use Lymphedema pumps on leg(s) 2-3 times a day for 45-60 minutes. If wearing any wraps or hose, do not remove them. Continue exercising as instructed. - use once per day Elevate legs to the level of the heart or above for 30 minutes daily and/or when sitting, a frequency of: - throughout the day Avoid standing for long periods of time. Exercise regularly Compression stocking or Garment 20-30 mm/Hg pressure to: - left leg daily Wound Treatment Wound #1 - Lower Leg Wound Laterality: Right, Lateral Peri-Wound Care: Sween Lotion (Moisturizing lotion) 1 x Per Week/30 Days Discharge  Instructions: Apply moisturizing lotion as directed Prim Dressing: Promogran Prisma Matrix, 4.34 (sq in) (silver collagen) 1 x Per Week/30 Days ary Discharge Instructions: Moisten collagen with saline or hydrogel Secondary Dressing: Woven Gauze Sponge, Non-Sterile 4x4 in 1 x Per Week/30 Days Discharge Instructions: Apply over primary dressing as directed. Compression Wrap: ThreePress (3 layer compression wrap) 1 x Per Week/30 Days Discharge Instructions: Apply three layer compression as directed. Wound #2 - Lower Leg Wound Laterality: Right, Lateral, Distal Peri-Wound Care: Sween Lotion (Moisturizing lotion) 1 x Per Week/30 Days Discharge Instructions: Apply moisturizing lotion as directed Prim Dressing: Promogran Prisma Matrix, 4.34 (sq in) (silver collagen) 1 x Per Week/30 Days ary Discharge Instructions: Moisten collagen with saline or hydrogel Secondary Dressing: Woven Gauze Sponge, Non-Sterile 4x4 in 1 x Per Week/30 Days Discharge Instructions: Apply over primary dressing as directed. Compression Wrap: ThreePress (3 layer compression wrap) 1 x Per Week/30 Days Discharge Instructions: Apply three layer compression as directed. Electronic Signature(s) Signed: 05/02/2022 9:59:22 AM By: Duanne Guess MD FACS Entered By: Duanne Guess on 05/02/2022 09:08:33 -------------------------------------------------------------------------------- Problem List Details Patient Name: Date of Service: Robert Brennan, Robert Brennan 05/02/2022 8:30 A M Medical Record Number: [id number] Patient Account Number: [id number] Date of Birth/Sex: Treating RN: [birth year] (51 y.o. Marlan Palau Primary Care Provider: Other Clinician: MA Phillis Haggis, [place] TTHEW Referring Provider: Treating Provider/Extender: Duanne Guess MA Phillis Haggis, [place] TTHEW Weeks in Treatment: 3 Active Problems ICD-10 Encounter Code Description Active Date MDM Diagnosis [phone] Non-pressure chronic ulcer of other part of right lower leg with fat  layer 04/10/2022 No Yes exposed I89.0 Lymphedema, not elsewhere classified 04/10/2022 No Yes E11.622 Type 2 diabetes mellitus with other skin ulcer 04/10/2022 No Yes Inactive Problems Resolved Problems Electronic Signature(s) Signed: 05/02/2022 9:06:42 AM By: Duanne Guess MD FACS Entered By: Duanne Guess on 05/02/2022 09:06:42 -------------------------------------------------------------------------------- Progress Note Details Patient Name: Date of Service: Robert Brennan, Robert Brennan 05/02/2022 8:30 A M Medical Record Number: [id number] Patient Account Number: [id number] Date of Birth/Sex: Treating RN: 14-Jan-1971 (51 y.o. Marlan Palau Primary Care Provider: MA Phillis Haggis, [place] TTHEW Other Clinician: Referring Provider: Treating Provider/Extender: Duanne Guess MA Phillis Haggis, [place] TTHEW Weeks in Treatment: 3 Subjective Chief Complaint Information obtained from Patient Patient seen for complaints of Non-Healing Wound. History of Present Illness (HPI) ADMISSION 04/10/2022 This is a 51 year old male with a past medical history significant for poorly controlled type 2 diabetes mellitus (hemoglobin A1c  11.8% in November 2022). He fell and struck his right anterior tibial surface in October or November 2022. He developed a hematoma but subsequently became infected. He underwent debridement on multiple occasions. Ultimately, he was left with a substantial defect that was skin grafted by plastic surgery. The skin graft has taken well but he still has some open tissue around the periphery. He had 2 new areas of breakdown occur secondary to drainage and moisture. Plastic surgery referred him to the wound care center for further evaluation and management. At the site of his skin graft, there has been excellent take, greater than 90%. Around the periphery, there is a thin rim of exposed fat layer with a bit of slough accumulation. There is granulation tissue forming. Just distal to this, there is a second wound  that exposes the fat layer with a fair amount of slough buildup. A similar, albeit smaller wound is located on the posterior leg. No concern for infection. He does have lymphedema pumps and says that he uses them about every other day. He had venous reflux studies done that were negative. I do not see any evidence of arterial studies. ABIs in clinic today were noncompressible but he has an easily palpable dorsalis pedis and posterior tibialis pulse. 04/17/2022: All of the wounds are little bit smaller today. There is a light layer of slough overlying the surfaces but underneath there is good granulation tissue. The posterior calf wound is nearly closed. 04/24/2022: The posterior wound has closed. Both of the other wounds are smaller today. There is hypertrophic granulation tissue on the more proximal anterior tibial wound and a light layer of slough on the more distal wound. No concern for infection. 05/02/2022: The proximal anterior tibial wound initially appeared to be closed, but there is a small opening underneath the eschar that was present. The more distal wound has contracted and has a light layer of slough with perimeter eschar. No concern for infection. Patient History Information obtained from Patient, Chart. Family History Cancer - Father,Paternal Grandparents, Diabetes - Maternal Grandparents,Father, Hypertension - Mother,Father, No family history of Heart Disease, Hereditary Spherocytosis, Kidney Disease, Lung Disease, Seizures, Stroke, Thyroid Problems, Tuberculosis. Social History Never smoker, Marital Status - Married, Alcohol Use - Never, Drug Use - No History, Caffeine Use - Moderate. Medical History Eyes Denies history of Cataracts, Glaucoma, Optic Neuritis Cardiovascular Patient has history of Hypertension Endocrine Patient has history of Type II Diabetes Denies history of Type I Diabetes Integumentary (Skin) Denies history of History of Burn Musculoskeletal Patient has  history of Gout Neurologic Patient has history of Neuropathy Oncologic Denies history of Received Chemotherapy, Received Radiation Psychiatric Denies history of Anorexia/bulimia, Confinement Anxiety Hospitalization/Surgery History - debridements of right leg ulcer. - split thickness skin graft right lower leg. Medical A Surgical History Notes nd Cardiovascular hyperlipidemia Objective Constitutional Slightly hypertensive. Slightly tachycardic, asymptomatic. No acute distress.. Vitals Time Taken: 8:37 AM, Height: 72 in, Weight: 280 lbs, BMI: 38, Temperature: 98.2 F, Pulse: 105 bpm, Respiratory Rate: 18 breaths/min, Blood Pressure: 143/78 mmHg, Capillary Blood Glucose: 120 mg/dl. Respiratory Normal work of breathing on room air.. General Notes: 05/02/2022: The proximal anterior tibial wound initially appeared to be closed, but there is a small opening underneath the eschar that was present. The more distal wound has contracted and has a light layer of slough with perimeter eschar. No concern for infection. Integumentary (Hair, Skin) Wound #1 status is Open. Original cause of wound was Trauma. The date acquired was: 07/24/2021. The wound has been  in treatment 3 weeks. The wound is located on the Right,Lateral Lower Leg. The wound measures 0.4cm length x 0.4cm width x 0.1cm depth; 0.126cm^2 area and 0.013cm^3 volume. There is Fat Layer (Subcutaneous Tissue) exposed. There is no tunneling or undermining noted. There is a medium amount of serosanguineous drainage noted. The wound margin is flat and intact. There is large (67-100%) red granulation within the wound bed. There is no necrotic tissue within the wound bed. Wound #2 status is Open. Original cause of wound was Trauma. The date acquired was: 09/26/2021. The wound has been in treatment 3 weeks. The wound is located on the Right,Distal,Lateral Lower Leg. The wound measures 1.6cm length x 1.6cm width x 0.1cm depth; 2.011cm^2 area and  0.201cm^3 volume. There is Fat Layer (Subcutaneous Tissue) exposed. There is no tunneling or undermining noted. There is a medium amount of serosanguineous drainage noted. The wound margin is flat and intact. There is large (67-100%) red granulation within the wound bed. There is a small (1-33%) amount of necrotic tissue within the wound bed including Adherent Slough. Assessment Active Problems ICD-10 Non-pressure chronic ulcer of 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 exposed Lymphedema, not elsewhere classified Type 2 diabetes mellitus with other skin ulcer Procedures Wound #1 Pre-procedure diagnosis of Wound #1 is a Diabetic Wound/Ulcer of the Lower Extremity located on the Right,Lateral Lower Leg .Severity of Tissue Pre Debridement is: Fat layer exposed. There was a Selective/Open Wound Non-Viable Tissue Debridement with a total area of 0.16 sq cm performed by Duanne Guess, MD. With the following instrument(s): Curette to remove Non-Viable tissue/material. Material removed includes Eschar after achieving pain control using Lidocaine 4% T opical Solution. No specimens were taken. A time out was conducted at 08:47, prior to the start of the procedure. A Minimum amount of bleeding was controlled with Pressure. The procedure was tolerated well with a pain level of 0 throughout and a pain level of 0 following the procedure. Post Debridement Measurements: 0.4cm length x 0.4cm width x 0.1cm depth; 0.013cm^3 volume. Character of Wound/Ulcer Post Debridement is improved. Severity of Tissue Post Debridement is: Fat layer exposed. Post procedure Diagnosis Wound #1: Same as Pre-Procedure Wound #2 Pre-procedure diagnosis of Wound #2 is a Diabetic Wound/Ulcer of the Lower Extremity located on the Right,Distal,Lateral Lower Leg .Severity of Tissue Pre Debridement is: Fat layer exposed. There was a Excisional Skin/Subcutaneous Tissue Debridement with a total area of 2.56 sq cm performed by  Duanne Guess, MD. With the following instrument(s): Curette to remove Non-Viable tissue/material. Material removed includes Eschar, Subcutaneous Tissue, and Slough after achieving pain control using Lidocaine 4% T opical Solution. No specimens were taken. A time out was conducted at 08:47, prior to the start of the procedure. A Minimum amount of bleeding was controlled with Pressure. The procedure was tolerated well with a pain level of 0 throughout and a pain level of 0 following the procedure. Post Debridement Measurements: 1.6cm length x 1.6cm width x 0.1cm depth; 0.201cm^3 volume. Character of Wound/Ulcer Post Debridement is improved. Severity of Tissue Post Debridement is: Fat layer exposed. Post procedure Diagnosis Wound #2: Same as Pre-Procedure Plan Follow-up Appointments: Return Appointment in 1 week. - Dr. Lady Gary RM 1 with [facility] Wednesday 05/09/22 @ 08:15am Bathing/ Shower/ Hygiene: May shower with protection but do not get wound dressing(s) wet. Edema Control - Lymphedema / SCD / Other: Lymphedema Pumps. Use Lymphedema pumps on leg(s) 2-3 times a day for 45-60 minutes. If wearing any wraps or hose, do not  remove them. Continue exercising as instructed. - use once per day Elevate legs to the level of the heart or above for 30 minutes daily and/or when sitting, a frequency of: - throughout the day Avoid standing for long periods of time. Exercise regularly Compression stocking or Garment 20-30 mm/Hg pressure to: - left leg daily WOUND #1: - Lower Leg Wound Laterality: Right, Lateral Peri-Wound Care: Sween Lotion (Moisturizing lotion) 1 x Per Week/30 Days Discharge Instructions: Apply moisturizing lotion as directed Prim Dressing: Promogran Prisma Matrix, 4.34 (sq in) (silver collagen) 1 x Per Week/30 Days ary Discharge Instructions: Moisten collagen with saline or hydrogel Secondary Dressing: Woven Gauze Sponge, Non-Sterile 4x4 in 1 x Per Week/30 Days Discharge Instructions:  Apply over primary dressing as directed. Com pression Wrap: ThreePress (3 layer compression wrap) 1 x Per Week/30 Days Discharge Instructions: Apply three layer compression as directed. WOUND #2: - Lower Leg Wound Laterality: Right, Lateral, Distal Peri-Wound Care: Sween Lotion (Moisturizing lotion) 1 x Per Week/30 Days Discharge Instructions: Apply moisturizing lotion as directed Prim Dressing: Promogran Prisma Matrix, 4.34 (sq in) (silver collagen) 1 x Per Week/30 Days ary Discharge Instructions: Moisten collagen with saline or hydrogel Secondary Dressing: Woven Gauze Sponge, Non-Sterile 4x4 in 1 x Per Week/30 Days Discharge Instructions: Apply over primary dressing as directed. Com pression Wrap: ThreePress (3 layer compression wrap) 1 x Per Week/30 Days Discharge Instructions: Apply three layer compression as directed. 05/02/2022: The proximal anterior tibial wound initially appeared to be closed, but there is a small opening underneath the eschar that was present. The more distal wound has contracted and has a light layer of slough with perimeter eschar. No concern for infection. I used a curette to debride the eschar from the proximal wound as well as eschar, non-viable subcu tissue, and slough from the more distal wound. We will continue to use Prisma silver collagen and 3 layer compression. Follow-up in 1 week. Electronic Signature(s) Signed: 05/02/2022 9:09:30 AM By: Duanne Guess MD FACS Previous Signature: 05/02/2022 9:09:00 AM Version By: Duanne Guess MD FACS Entered By: Duanne Guess on 05/02/2022 09:09:30 -------------------------------------------------------------------------------- HxROS Details Patient Name: Date of Service: Robert Brennan, Robert Brennan 05/02/2022 8:30 A M Medical Record Number: [id number] Patient Account Number: [id number] Date of Birth/Sex: Treating RN: 01/23/1971 (51 y.o. Marlan Palau Primary Care Provider: MA Phillis Haggis, [place] TTHEW Other Clinician: Referring  Provider: Treating Provider/Extender: Duanne Guess MA Phillis Haggis, MA TTHEW Weeks in Treatment: 3 Information Obtained From Patient Chart Eyes Medical History: Negative for: Cataracts; Glaucoma; Optic Neuritis Cardiovascular Medical History: Positive for: Hypertension Past Medical History Notes: hyperlipidemia Endocrine Medical History: Positive for: Type II Diabetes Negative for: Type I Diabetes Time with diabetes: 21 yrs Treated with: Insulin, Oral agents Blood sugar tested every day: No Integumentary (Skin) Medical History: Negative for: History of Burn Musculoskeletal Medical History: Positive for: Gout Neurologic Medical History: Positive for: Neuropathy Oncologic Medical History: Negative for: Received Chemotherapy; Received [facility] Medical History: Negative for: Anorexia/bulimia; Confinement Anxiety Immunizations Pneumococcal Vaccine: Received Pneumococcal Vaccination: Yes Received Pneumococcal Vaccination On or After 60th Birthday: No Implantable Devices No devices added Hospitalization / Surgery History Type of Hospitalization/Surgery debridements of right leg ulcer split thickness skin graft right lower leg Family and Social History Cancer: Yes - Father,Paternal Grandparents; Diabetes: Yes - Maternal Grandparents,Father; Heart Disease: No; Hereditary Spherocytosis: No; Hypertension: Yes - Mother,Father; Kidney Disease: No; Lung Disease: No; Seizures: No; Stroke: No; Thyroid Problems: No; Tuberculosis: No; Never smoker; Marital Status - Married; Alcohol Use: Never; Drug Use:  No History; Caffeine Use: Moderate; Financial Concerns: No; Food, Clothing or Shelter Needs: No; Support System Lacking: No; Transportation Concerns: No Electronic Signature(s) Signed: 05/02/2022 9:59:22 AM By: Duanne Guessannon, Swain Acree MD FACS Signed: 05/02/2022 4:38:24 PM By: Gelene MinkHerrington, Taylor Entered By: Duanne Guessannon, Mckenlee Mangham on 05/02/2022  09:07:54 -------------------------------------------------------------------------------- SuperBill Details Patient Name: Date of Service: Robert PrimerBA NKS, Mitchell 05/02/2022 Medical Record Number: [id number] Patient Account Number: [id number] Date of Birth/Sex: Treating RN: 09/14/1971 (51 y.o. 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 I89.0 Lymphedema, not elsewhere classified E11.622 Type 2 diabetes mellitus with other skin ulcer Facility Procedures CPT4 Code: [id number] Description: 11042 - DEB SUBQ TISSUE 20 SQ CM/< ICD-10 Diagnosis Description 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 of 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 expos Modifier: ed Quantity: 1 CPT4 Code: [id number] Description: 97597 - DEBRIDE WOUND 1ST 20 SQ CM OR < ICD-10 Diagnosis Description 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 of 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 expos Modifier: ed Quantity: 1 Physician Procedures : CPT4 Code Description Modifier [id number] 99213 - WC PHYS LEVEL 3 - EST PT 25 ICD-10 Diagnosis Description 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 of 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 exposed I89.0 Lymphedema, not elsewhere classified E11.622 Type 2  diabetes mellitus with other skin ulcer Quantity: 1 : [id number] 11042 - WC PHYS SUBQ TISS 20 SQ CM ICD-10 Diagnosis Description 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 of 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 exposed Quantity: 1 : [id number] 97597 - WC PHYS DEBR WO ANESTH 20 SQ CM ICD-10 Diagnosis Description 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 of 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 exposed Quantity: 1 Electronic Signature(s) Signed: 05/02/2022 9:09:48 AM By: Duanne Guessannon,  Nakayla Rorabaugh MD FACS Entered By: Duanne Guessannon, Shantil Vallejo on 05/02/2022 09:09:47

## 2022-05-09 ENCOUNTER — Encounter (HOSPITAL_BASED_OUTPATIENT_CLINIC_OR_DEPARTMENT_OTHER): Payer: BC Managed Care – PPO | Admitting: General Surgery

## 2022-05-09 DIAGNOSIS — E11621 Type 2 diabetes mellitus with foot ulcer: Secondary | ICD-10-CM | POA: Diagnosis not present

## 2022-05-10 NOTE — Progress Notes (Signed)
Varnell, Orvis Garry ([id number]) Visit Report for 05/09/2022 Chief Complaint Document Details Patient Name: Date of Service: Robert Brennan, Robert Brennan 05/09/2022 8:15 A M Medical Record Number: [id number] Patient Account Number: [id number] Date of Birth/Sex: Treating RN: Feb 11, 1971 (51 y.o. M) Primary Care Provider: MA Phillis Haggis, MA TTHEW Other Clinician: Referring Provider: Treating Provider/Extender: Duanne Guess MA Phillis Haggis, MA TTHEW Weeks in Treatment: 4 Information Obtained from: Patient Chief Complaint Patient seen for complaints of Non-Healing Wound. Electronic Signature(s) Signed: 05/09/2022 9:12:10 AM By: Duanne Guess MD FACS Entered By: Duanne Guess on 05/09/2022 09:12:10 -------------------------------------------------------------------------------- Debridement Details Patient Name: Date of Service: Robert Brennan, Robert Brennan 05/09/2022 8:15 A M Medical Record Number: [id number] Patient Account Number: [id number] Date of Birth/Sex: Treating RN: 1971/08/28 (50 y.o. [name] Primary Care Provider: MA Phillis Haggis, [place] TTHEW Other Clinician: Referring Provider: Treating Provider/Extender: Duanne Guess MA Phillis Haggis, MA TTHEW Weeks in Treatment: 4 Debridement Performed for Assessment: Wound #2 Right,Distal,Lateral Lower Leg Performed By: Physician Duanne Guess, MD Debridement Type: Debridement Severity of Tissue Pre Debridement: Fat layer exposed Level of Consciousness (Pre-procedure): Awake and Alert Pre-procedure Verification/Time Out Yes - 08:55 Taken: Start Time: 08:55 Pain Control: Lidocaine 4% T opical Solution T Area Debrided (L x W): otal 1.7 (cm) x 1.6 (cm) = 2.72 (cm) Tissue and other material debrided: Non-Viable, Slough, Slough Level: Non-Viable Tissue Debridement Description: Selective/Open Wound Instrument: Curette Bleeding: Minimum Hemostasis Achieved: Pressure Procedural Pain: 0 Post Procedural Pain: 0 Response to Treatment: Procedure was tolerated well Level of  Consciousness (Post- Awake and Alert procedure): Post Debridement Measurements of Total Wound Length: (cm) 1.7 Width: (cm) 1.6 Depth: (cm) 0.1 Volume: (cm) 0.214 Character of Wound/Ulcer Post Debridement: Improved Severity of Tissue Post Debridement: Fat layer exposed Post Procedure Diagnosis Same as Pre-procedure Electronic Signature(s) Signed: 05/09/2022 9:47:31 AM By: Duanne Guess MD FACS Signed: 05/09/2022 5:03:29 PM By: Zenaida Deed RN, BSN Entered By: Zenaida Deed on 05/09/2022 08:56:39 -------------------------------------------------------------------------------- Debridement Details Patient Name: Date of Service: Robert Brennan, Robert Brennan 05/09/2022 8:15 A M Medical Record Number: [id number] Patient Account Number: [id number] Date of Birth/Sex: Treating RN: 12/03/1970 (51 y.o. [name] Primary Care Provider: MA Phillis Haggis, [place] TTHEW Other Clinician: Referring Provider: Treating Provider/Extender: Duanne Guess MA Phillis Haggis, MA TTHEW Weeks in Treatment: 4 Debridement Performed for Assessment: Wound #1 Right,Lateral Lower Leg Performed By: Physician Duanne Guess, MD Debridement Type: Debridement Severity of Tissue Pre Debridement: Fat layer exposed Level of Consciousness (Pre-procedure): Awake and Alert Pre-procedure Verification/Time Out Yes - 08:55 Taken: Start Time: 08:55 Pain Control: Lidocaine 4% T opical Solution T Area Debrided (L x W): otal 0.3 (cm) x 0.3 (cm) = 0.09 (cm) Tissue and other material debrided: Non-Viable, Slough, Slough Level: Non-Viable Tissue Debridement Description: Selective/Open Wound Instrument: Curette Bleeding: Minimum Hemostasis Achieved: Pressure Procedural Pain: 0 Post Procedural Pain: 0 Response to Treatment: Procedure was tolerated well Level of Consciousness (Post- Awake and Alert procedure): Post Debridement Measurements of Total Wound Length: (cm) 0.3 Width: (cm) 0.3 Depth: (cm) 0.1 Volume: (cm) 0.007 Character  of Wound/Ulcer Post Debridement: Improved Severity of Tissue Post Debridement: Fat layer exposed Post Procedure Diagnosis Same as Pre-procedure Electronic Signature(s) Signed: 05/09/2022 9:47:31 AM By: Duanne Guess MD FACS Signed: 05/09/2022 5:03:29 PM By: Zenaida Deed RN, BSN Entered By: Zenaida Deed on 05/09/2022 08:57:22 -------------------------------------------------------------------------------- HPI Details Patient Name: Date of Service: Robert Brennan, Robert Brennan 05/09/2022 8:15 A M Medical Record Number: [id number] Patient Account Number: [id number] Date of Birth/Sex: Treating RN: 24-Jul-1971 (51 y.o. M) Primary Care Provider: MA  Phillis HaggisHIA S, MA TTHEW Other Clinician: Referring Provider: Treating Provider/Extender: Duanne Guessannon, Jadier Rockers MA Phillis HaggisHIA S, MA TTHEW Weeks in Treatment: 4 History of Present Illness HPI Description: ADMISSION 04/10/2022 This is a 51 year old male with a past medical history significant for poorly controlled type 2 diabetes mellitus (hemoglobin A1c 11.8% in November 2022). He fell and struck his right anterior tibial surface in October or November 2022. He developed a hematoma but subsequently became infected. He underwent debridement on multiple occasions. Ultimately, he was left with a substantial defect that was skin grafted by plastic surgery. The skin graft has taken well but he still has some open tissue around the periphery. He had 2 new areas of breakdown occur secondary to drainage and moisture. Plastic surgery referred him to the wound care center for further evaluation and management. At the site of his skin graft, there has been excellent take, greater than 90%. Around the periphery, there is a thin rim of exposed fat layer with a bit of slough accumulation. There is granulation tissue forming. Just distal to this, there is a second wound that exposes the fat layer with a fair amount of slough buildup. A similar, albeit smaller wound is located on the posterior leg.  No concern for infection. He does have lymphedema pumps and says that he uses them about every other day. He had venous reflux studies done that were negative. I do not see any evidence of arterial studies. ABIs in clinic today were noncompressible but he has an easily palpable dorsalis pedis and posterior tibialis pulse. 04/17/2022: All of the wounds are little bit smaller today. There is a light layer of slough overlying the surfaces but underneath there is good granulation tissue. The posterior calf wound is nearly closed. 04/24/2022: The posterior wound has closed. Both of the other wounds are smaller today. There is hypertrophic granulation tissue on the more proximal anterior tibial wound and a light layer of slough on the more distal wound. No concern for infection. 05/02/2022: The proximal anterior tibial wound initially appeared to be closed, but there is a small opening underneath the eschar that was present. The more distal wound has contracted and has a light layer of slough with perimeter eschar. No concern for infection. 05/09/2022: The proximal wound is down to just a couple of millimeters. The more distal wound has a band of epithelialized tissue creating 2 separate wounds. The smaller one is very superficial; the larger is a little bit deeper but has good granulation tissue present with just a little bit of slough. Electronic Signature(s) Signed: 05/09/2022 9:12:57 AM By: Duanne Guessannon, Chanta Bauers MD FACS Entered By: Duanne Guessannon, Emmery Seiler on 05/09/2022 09:12:56 -------------------------------------------------------------------------------- Physical Exam Details Patient Name: Date of Service: Robert PrimerBA NKS, Leny 05/09/2022 8:15 A M Medical Record Number: [id number] Patient Account Number: [id number] Date of Birth/Sex: Treating RN: 03/01/1971 (51 y.o. M) Primary Care Provider: MA Phillis HaggisHIA S, MA TTHEW Other Clinician: Referring Provider: Treating Provider/Extender: Duanne Guessannon, Jaqwon Manfred MA THIA S, MA TTHEW Weeks in  Treatment: 4 Constitutional Hypertensive, asymptomatic. Slightly tachycardic, asymptomatic. . . No acute distress.Marland Kitchen. Respiratory Normal work of breathing on room air.. Notes 05/09/2022: The proximal wound is down to just a couple of millimeters. The more distal wound has a band of epithelialized tissue creating 2 separate wounds. The smaller one is very superficial; the larger is a little bit deeper but has good granulation tissue present with just a little bit of slough. Electronic Signature(s) Signed: 05/09/2022 9:13:26 AM By: Duanne Guessannon, Minah Axelrod MD FACS Entered By: Duanne Guessannon, Newell Wafer on  05/09/2022 09:13:26 -------------------------------------------------------------------------------- Physician Orders Details Patient Name: Date of Service: Robert Brennan, Ayush 05/09/2022 8:15 A M Medical Record Number: [id number] Patient Account Number: [id number] Date of Birth/Sex: Treating RN: [birth year] (51 y.o. [name] Primary Care Provider: MA Phillis Haggis, [place] TTHEW Other Clinician: Referring Provider: Treating Provider/Extender: Duanne Guess MA Phillis Haggis, MA TTHEW Weeks in Treatment: 4 Verbal / Phone Orders: No Diagnosis Coding ICD-10 Coding Code Description [phone] Non-pressure chronic ulcer of 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 exposed I89.0 Lymphedema, not elsewhere classified E11.622 Type 2 diabetes mellitus with other skin ulcer Follow-up Appointments ppointment in 1 week. - Dr. Lady Gary RM 1 with [facility] Return A Wednesday 05/17/22 @ 08:15am Anesthetic (In clinic) Topical Lidocaine 4% applied to wound bed Bathing/ Shower/ Hygiene May shower with protection but do not get wound dressing(s) wet. Edema Control - Lymphedema / SCD / Other Lymphedema Pumps. Use Lymphedema pumps on leg(s) 2-3 times a day for 45-60 minutes. If wearing any wraps or hose, do not remove them. Continue exercising as instructed. - use once per day Elevate legs to the level of the heart or above for 30 minutes  daily and/or when sitting, a frequency of: - throughout the day Avoid standing for long periods of time. Exercise regularly Compression stocking or Garment 20-30 mm/Hg pressure to: - left leg daily Wound Treatment Wound #1 - Lower Leg Wound Laterality: Right, Lateral Peri-Wound Care: Sween Lotion (Moisturizing lotion) 1 x Per Week/30 Days Discharge Instructions: Apply moisturizing lotion as directed Prim Dressing: Promogran Prisma Matrix, 4.34 (sq in) (silver collagen) 1 x Per Week/30 Days ary Discharge Instructions: Moisten collagen with saline or hydrogel Secondary Dressing: Woven Gauze Sponge, Non-Sterile 4x4 in 1 x Per Week/30 Days Discharge Instructions: Apply over primary dressing as directed. Compression Wrap: ThreePress (3 layer compression wrap) 1 x Per Week/30 Days Discharge Instructions: Apply three layer compression as directed. Wound #2 - Lower Leg Wound Laterality: Right, Lateral, Distal Peri-Wound Care: Sween Lotion (Moisturizing lotion) 1 x Per Week/30 Days Discharge Instructions: Apply moisturizing lotion as directed Prim Dressing: Promogran Prisma Matrix, 4.34 (sq in) (silver collagen) 1 x Per Week/30 Days ary Discharge Instructions: Moisten collagen with saline or hydrogel Secondary Dressing: Woven Gauze Sponge, Non-Sterile 4x4 in 1 x Per Week/30 Days Discharge Instructions: Apply over primary dressing as directed. Compression Wrap: ThreePress (3 layer compression wrap) 1 x Per Week/30 Days Discharge Instructions: Apply three layer compression as directed. Electronic Signature(s) Signed: 05/09/2022 9:47:31 AM By: Duanne Guess MD FACS Entered By: Duanne Guess on 05/09/2022 09:13:38 -------------------------------------------------------------------------------- Problem List Details Patient Name: Date of Service: Robert Brennan, Robert Brennan 05/09/2022 8:15 A M Medical Record Number: [id number] Patient Account Number: [id number] Date of Birth/Sex: Treating RN: 1970/11/17 (51  y.o. [name] Primary Care Provider: MA Phillis Haggis, [place] TTHEW Other Clinician: Referring Provider: Treating Provider/Extender: Duanne Guess MA Phillis Haggis, MA TTHEW Weeks in Treatment: 4 Active Problems ICD-10 Encounter Code Description Active Date MDM Diagnosis [phone] Non-pressure chronic ulcer of 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 04/10/2022 No Yes exposed I89.0 Lymphedema, not elsewhere classified 04/10/2022 No Yes E11.622 Type 2 diabetes mellitus with other skin ulcer 04/10/2022 No Yes Inactive Problems Resolved Problems Electronic Signature(s) Signed: 05/09/2022 9:11:55 AM By: Duanne Guess MD FACS Entered By: Duanne Guess on 05/09/2022 09:11:55 -------------------------------------------------------------------------------- Progress Note Details Patient Name: Date of Service: Robert Brennan, Lunden 05/09/2022 8:15 A M Medical Record Number: [id number] Patient Account Number: [id number] Date of Birth/Sex: Treating RN: 08-Jul-1971 (50 y.o.  M) Primary Care Provider: MA Phillis Haggis, MA TTHEW Other Clinician: Referring Provider: Treating Provider/Extender: Duanne Guess MA Phillis Haggis, MA TTHEW Weeks in Treatment: 4 Subjective Chief Complaint Information obtained from Patient Patient seen for complaints of Non-Healing Wound. History of Present Illness (HPI) ADMISSION 04/10/2022 This is a 51 year old male with a past medical history significant for poorly controlled type 2 diabetes mellitus (hemoglobin A1c 11.8% in November 2022). He fell and struck his right anterior tibial surface in October or November 2022. He developed a hematoma but subsequently became infected. He underwent debridement on multiple occasions. Ultimately, he was left with a substantial defect that was skin grafted by plastic surgery. The skin graft has taken well but he still has some open tissue around the periphery. He had 2 new areas of breakdown occur secondary to drainage and moisture. Plastic surgery  referred him to the wound care center for further evaluation and management. At the site of his skin graft, there has been excellent take, greater than 90%. Around the periphery, there is a thin rim of exposed fat layer with a bit of slough accumulation. There is granulation tissue forming. Just distal to this, there is a second wound that exposes the fat layer with a fair amount of slough buildup. A similar, albeit smaller wound is located on the posterior leg. No concern for infection. He does have lymphedema pumps and says that he uses them about every other day. He had venous reflux studies done that were negative. I do not see any evidence of arterial studies. ABIs in clinic today were noncompressible but he has an easily palpable dorsalis pedis and posterior tibialis pulse. 04/17/2022: All of the wounds are little bit smaller today. There is a light layer of slough overlying the surfaces but underneath there is good granulation tissue. The posterior calf wound is nearly closed. 04/24/2022: The posterior wound has closed. Both of the other wounds are smaller today. There is hypertrophic granulation tissue on the more proximal anterior tibial wound and a light layer of slough on the more distal wound. No concern for infection. 05/02/2022: The proximal anterior tibial wound initially appeared to be closed, but there is a small opening underneath the eschar that was present. The more distal wound has contracted and has a light layer of slough with perimeter eschar. No concern for infection. 05/09/2022: The proximal wound is down to just a couple of millimeters. The more distal wound has a band of epithelialized tissue creating 2 separate wounds. The smaller one is very superficial; the larger is a little bit deeper but has good granulation tissue present with just a little bit of slough. Patient History Information obtained from Patient, Chart. Family History Cancer - Father,Paternal Grandparents,  Diabetes - Maternal Grandparents,Father, Hypertension - Mother,Father, No family history of Heart Disease, Hereditary Spherocytosis, Kidney Disease, Lung Disease, Seizures, Stroke, Thyroid Problems, Tuberculosis. Social History Never smoker, Marital Status - Married, Alcohol Use - Never, Drug Use - No History, Caffeine Use - Moderate. Medical History Eyes Denies history of Cataracts, Glaucoma, Optic Neuritis Cardiovascular Patient has history of Hypertension Endocrine Patient has history of Type II Diabetes Denies history of Type I Diabetes Integumentary (Skin) Denies history of History of Burn Musculoskeletal Patient has history of Gout Neurologic Patient has history of Neuropathy Oncologic Denies history of Received Chemotherapy, Received Radiation Psychiatric Denies history of Anorexia/bulimia, Confinement Anxiety Hospitalization/Surgery History - debridements of right leg ulcer. - split thickness skin graft right lower leg. Medical A Surgical History Notes nd Cardiovascular  hyperlipidemia Objective Constitutional Hypertensive, asymptomatic. Slightly tachycardic, asymptomatic. No acute distress.. Vitals Time Taken: 8:32 AM, Height: 72 in, Weight: 280 lbs, BMI: 38, Temperature: 98.2 F, Pulse: 108 bpm, Respiratory Rate: 18 breaths/min, Blood Pressure: 172/84 mmHg. Respiratory Normal work of breathing on room air.. General Notes: 05/09/2022: The proximal wound is down to just a couple of millimeters. The more distal wound has a band of epithelialized tissue creating 2 separate wounds. The smaller one is very superficial; the larger is a little bit deeper but has good granulation tissue present with just a little bit of slough. Integumentary (Hair, Skin) Wound #1 status is Open. Original cause of wound was Trauma. The date acquired was: 07/24/2021. The wound has been in treatment 4 weeks. The wound is located on the Right,Lateral Lower Leg. The wound measures 0.3cm length x  0.3cm width x 0.1cm depth; 0.071cm^2 area and 0.007cm^3 volume. There is Fat Layer (Subcutaneous Tissue) exposed. There is no tunneling or undermining noted. There is a small amount of serosanguineous drainage noted. The wound margin is flat and intact. There is large (67-100%) red granulation within the wound bed. There is no necrotic tissue within the wound bed. Wound #2 status is Open. Original cause of wound was Trauma. The date acquired was: 09/26/2021. The wound has been in treatment 4 weeks. The wound is located on the Right,Distal,Lateral Lower Leg. The wound measures 1.7cm length x 1.6cm width x 0.1cm depth; 2.136cm^2 area and 0.214cm^3 volume. There is Fat Layer (Subcutaneous Tissue) exposed. There is no tunneling or undermining noted. There is a medium amount of serosanguineous drainage noted. The wound margin is flat and intact. There is large (67-100%) red granulation within the wound bed. There is a small (1-33%) amount of necrotic tissue within the wound bed including Adherent Slough. Assessment Active Problems ICD-10 Non-pressure chronic ulcer of 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 exposed Lymphedema, not elsewhere classified Type 2 diabetes mellitus with other skin ulcer Procedures Wound #1 Pre-procedure diagnosis of Wound #1 is a Diabetic Wound/Ulcer of the Lower Extremity located on the Right,Lateral Lower Leg .Severity of Tissue Pre Debridement is: Fat layer exposed. There was a Selective/Open Wound Non-Viable Tissue Debridement with a total area of 0.09 sq cm performed by Duanne Guess, MD. With the following instrument(s): Curette to remove Non-Viable tissue/material. Material removed includes [facility] after achieving pain control using Lidocaine 4% T opical Solution. No specimens were taken. A time out was conducted at 08:55, prior to the start of the procedure. A Minimum amount of bleeding was controlled with Pressure. The procedure was tolerated well with a pain  level of 0 throughout and a pain level of 0 following the procedure. Post Debridement Measurements: 0.3cm length x 0.3cm width x 0.1cm depth; 0.007cm^3 volume. Character of Wound/Ulcer Post Debridement is improved. Severity of Tissue Post Debridement is: Fat layer exposed. Post procedure Diagnosis Wound #1: Same as Pre-Procedure Pre-procedure diagnosis of Wound #1 is a Diabetic Wound/Ulcer of the Lower Extremity located on the Right,Lateral Lower Leg . There was a Three Layer Compression Therapy Procedure by Zenaida Deed, RN. Post procedure Diagnosis Wound #1: Same as Pre-Procedure Wound #2 Pre-procedure diagnosis of Wound #2 is a Diabetic Wound/Ulcer of the Lower Extremity located on the Right,Distal,Lateral Lower Leg .Severity of Tissue Pre Debridement is: Fat layer exposed. There was a Selective/Open Wound Non-Viable Tissue Debridement with a total area of 2.72 sq cm performed by Duanne Guess, MD. With the following instrument(s): Curette to remove Non-Viable tissue/material. Material removed  includes Slough after achieving pain control using Lidocaine 4% T opical Solution. No specimens were taken. A time out was conducted at 08:55, prior to the start of the procedure. A Minimum amount of bleeding was controlled with Pressure. The procedure was tolerated well with a pain level of 0 throughout and a pain level of 0 following the procedure. Post Debridement Measurements: 1.7cm length x 1.6cm width x 0.1cm depth; 0.214cm^3 volume. Character of Wound/Ulcer Post Debridement is improved. Severity of Tissue Post Debridement is: Fat layer exposed. Post procedure Diagnosis Wound #2: Same as Pre-Procedure Plan Follow-up Appointments: Return Appointment in 1 week. - Dr. Lady Gary RM 1 with [facility] Wednesday 05/17/22 @ 08:15am Anesthetic: (In clinic) Topical Lidocaine 4% applied to wound bed Bathing/ Shower/ Hygiene: May shower with protection but do not get wound dressing(s) wet. Edema Control -  Lymphedema / SCD / Other: Lymphedema Pumps. Use Lymphedema pumps on leg(s) 2-3 times a day for 45-60 minutes. If wearing any wraps or hose, do not remove them. Continue exercising as instructed. - use once per day Elevate legs to the level of the heart or above for 30 minutes daily and/or when sitting, a frequency of: - throughout the day Avoid standing for long periods of time. Exercise regularly Compression stocking or Garment 20-30 mm/Hg pressure to: - left leg daily WOUND #1: - Lower Leg Wound Laterality: Right, Lateral Peri-Wound Care: Sween Lotion (Moisturizing lotion) 1 x Per Week/30 Days Discharge Instructions: Apply moisturizing lotion as directed Prim Dressing: Promogran Prisma Matrix, 4.34 (sq in) (silver collagen) 1 x Per Week/30 Days ary Discharge Instructions: Moisten collagen with saline or hydrogel Secondary Dressing: Woven Gauze Sponge, Non-Sterile 4x4 in 1 x Per Week/30 Days Discharge Instructions: Apply over primary dressing as directed. Com pression Wrap: ThreePress (3 layer compression wrap) 1 x Per Week/30 Days Discharge Instructions: Apply three layer compression as directed. WOUND #2: - Lower Leg Wound Laterality: Right, Lateral, Distal Peri-Wound Care: Sween Lotion (Moisturizing lotion) 1 x Per Week/30 Days Discharge Instructions: Apply moisturizing lotion as directed Prim Dressing: Promogran Prisma Matrix, 4.34 (sq in) (silver collagen) 1 x Per Week/30 Days ary Discharge Instructions: Moisten collagen with saline or hydrogel Secondary Dressing: Woven Gauze Sponge, Non-Sterile 4x4 in 1 x Per Week/30 Days Discharge Instructions: Apply over primary dressing as directed. Com pression Wrap: ThreePress (3 layer compression wrap) 1 x Per Week/30 Days Discharge Instructions: Apply three layer compression as directed. 05/09/2022: The proximal wound is down to just a couple of millimeters. The more distal wound has a band of epithelialized tissue creating 2 separate  wounds. The smaller one is very superficial; the larger is a little bit deeper but has good granulation tissue present with just a little bit of slough. I used a curette to debride slough from all of the wound surfaces. We will continue to use Prisma silver collagen and 3 layer compression. Follow-up in 1 week. Electronic Signature(s) Signed: 05/09/2022 9:14:01 AM By: Duanne Guess MD FACS Entered By: Duanne Guess on 05/09/2022 09:14:00 -------------------------------------------------------------------------------- HxROS Details Patient Name: Date of Service: Robert Brennan, Robert Brennan 05/09/2022 8:15 A M Medical Record Number: [id number] Patient Account Number: [id number] Date of Birth/Sex: Treating RN: 10-Jun-1971 (51 y.o. M) Primary Care Provider: MA Phillis Haggis, MA TTHEW Other Clinician: Referring Provider: Treating Provider/Extender: Duanne Guess MA Phillis Haggis, MA TTHEW Weeks in Treatment: 4 Information Obtained From Patient Chart Eyes Medical History: Negative for: Cataracts; Glaucoma; Optic Neuritis Cardiovascular Medical History: Positive for: Hypertension Past Medical History Notes: hyperlipidemia Endocrine Medical History: Positive  for: Type II Diabetes Negative for: Type I Diabetes Time with diabetes: 21 yrs Treated with: Insulin, Oral agents Blood sugar tested every day: No Integumentary (Skin) Medical History: Negative for: History of Burn Musculoskeletal Medical History: Positive for: Gout Neurologic Medical History: Positive for: Neuropathy Oncologic Medical History: Negative for: Received Chemotherapy; Received Radiation Psychiatric Medical History: Negative for: Anorexia/bulimia; Confinement Anxiety Immunizations Pneumococcal Vaccine: Received Pneumococcal Vaccination: Yes Received Pneumococcal Vaccination On or After 60th Birthday: No Implantable Devices No devices added Hospitalization / Surgery History Type of Hospitalization/Surgery debridements of  right leg ulcer split thickness skin graft right lower leg Family and Social History Cancer: Yes - Father,Paternal Grandparents; Diabetes: Yes - Maternal Grandparents,Father; Heart Disease: No; Hereditary Spherocytosis: No; Hypertension: Yes - Mother,Father; Kidney Disease: No; Lung Disease: No; Seizures: No; Stroke: No; Thyroid Problems: No; Tuberculosis: No; Never smoker; Marital Status - Married; Alcohol Use: Never; Drug Use: No History; Caffeine Use: Moderate; Financial Concerns: No; Food, Clothing or Shelter Needs: No; Support System Lacking: No; Transportation Concerns: No Electronic Signature(s) Signed: 05/09/2022 9:47:31 AM By: Duanne Guess MD FACS Entered By: Duanne Guess on 05/09/2022 09:13:03 -------------------------------------------------------------------------------- SuperBill Details Patient Name: Date of Service: Robert Brennan, Kyler 05/09/2022 Medical Record Number: [id number] Patient Account Number: [id number] Date of Birth/Sex: Treating RN: [birth year] (51 y.o. M) Primary Care Provider: MA Phillis Haggis, MA TTHEW Other Clinician: Referring Provider: Treating Provider/Extender: Duanne Guess MA Phillis Haggis, MA TTHEW Weeks in Treatment: 4 Diagnosis Coding ICD-10 Codes Code Description [phone] Non-pressure chronic ulcer of 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 exposed I89.0 Lymphedema, not elsewhere classified E11.622 Type 2 diabetes mellitus with other skin ulcer Facility Procedures CPT4 Code: [id number] Description: [phone] - DEBRIDE WOUND 1ST 20 SQ CM OR < ICD-10 Diagnosis Description 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 of 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 expos Modifier: ed Quantity: 1 Physician Procedures : CPT4 Code Description Modifier [id number] - WC PHYS LEVEL 3 - EST PT 25 ICD-10 Diagnosis Description 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 of 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 exposed I89.0 Lymphedema, not elsewhere classified E11.622 Type 2   diabetes mellitus with other skin ulcer Quantity: 1 : [id number] - WC PHYS DEBR WO ANESTH 20 SQ CM ICD-10 Diagnosis Description 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 of 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 exposed Quantity: 1 Electronic Signature(s) Signed: 05/09/2022 9:14:21 AM By: Duanne Guess MD FACS Entered By: Duanne Guess on 05/09/2022 09:14:21

## 2022-05-10 NOTE — Progress Notes (Signed)
Robert Brennan, Robert Brennan ([id number]) Visit Report for 05/09/2022 Arrival Information Details Patient Name: Date of Service: Robert Brennan, Robert Brennan 05/09/2022 8:15 A M Medical Record Number: [id number] Patient Account Number: [id number] Date of Birth/Sex: Treating RN: January 17, 1971 (51 y.o. Robert Brennan Primary Care Provider: MA Durenda Guthrie, [place] TTHEW Other Clinician: Referring Provider: Treating Provider/Extender: Fredirick Maudlin MA Durenda Guthrie, MA TTHEW Weeks in Treatment: 4 Visit Information History Since Last Visit Added or deleted any medications: No Patient Arrived: Ambulatory Any new allergies or adverse reactions: No Arrival Time: 08:32 Had a fall or experienced change in No Accompanied By: self activities of daily living that may affect Transfer Assistance: None risk of falls: Patient Identification Verified: Yes Signs or symptoms of abuse/neglect since last visito No Secondary Verification Process Completed: Yes Hospitalized since last visit: No Patient Requires Transmission-Based Precautions: No Implantable device outside of the clinic excluding No Patient Has Alerts: No cellular tissue based products placed in the center since last visit: Has Dressing in Place as Prescribed: Yes Has Compression in Place as Prescribed: Yes Pain Present Now: No Electronic Signature(s) Signed: 05/09/2022 4:25:48 PM By: Adline Peals Entered By: Adline Peals on 05/09/2022 08:32:45 -------------------------------------------------------------------------------- Compression Therapy Details Patient Name: Date of Service: Robert Brennan, Robert Brennan 05/09/2022 8:15 A M Medical Record Number: [id number] Patient Account Number: [id number] Date of Birth/Sex: Treating RN: 1971/06/11 (51 y.o. Robert Brennan Primary Care Provider: MA Durenda Guthrie, [place] TTHEW Other Clinician: Referring Provider: Treating Provider/Extender: Fredirick Maudlin MA Durenda Guthrie, MA TTHEW Weeks in Treatment: 4 Compression Therapy Performed for Wound Assessment:  Wound #1 Right,Lateral Lower Leg Performed By: Clinician Baruch Gouty, RN Compression Type: Three Layer Post Procedure Diagnosis Same as Pre-procedure Electronic Signature(s) Signed: 05/09/2022 5:03:29 PM By: Baruch Gouty RN, BSN Entered By: Baruch Gouty on 05/09/2022 08:57:43 -------------------------------------------------------------------------------- Encounter Discharge Information Details Patient Name: Date of Service: Robert Brennan, Robert Brennan 05/09/2022 8:15 A M Medical Record Number: [id number] Patient Account Number: [id number] Date of Birth/Sex: Treating RN: Oct 05, 1970 (51 y.o. Robert Brennan Primary Care Provider: MA Durenda Guthrie, [place] TTHEW Other Clinician: Referring Provider: Treating Provider/Extender: Fredirick Maudlin MA Durenda Guthrie, MA TTHEW Weeks in Treatment: 4 Encounter Discharge Information Items Post Procedure Vitals Discharge Condition: Stable Temperature (F): 98.2 Ambulatory Status: Ambulatory Pulse (bpm): 108 Discharge Destination: Home Respiratory Rate (breaths/min): 18 Transportation: Private Auto Blood Pressure (mmHg): 172/84 Accompanied By: self Schedule Follow-up Appointment: Yes Clinical Summary of Care: Patient Declined Electronic Signature(s) Signed: 05/09/2022 5:03:29 PM By: Baruch Gouty RN, BSN Entered By: Baruch Gouty on 05/09/2022 09:16:55 -------------------------------------------------------------------------------- Lower Extremity Assessment Details Patient Name: Date of Service: Robert Brennan, Robert Brennan 05/09/2022 8:15 A M Medical Record Number: [id number] Patient Account Number: [id number] Date of Birth/Sex: Treating RN: 02-15-1971 (51 y.o. Robert Brennan Primary Care Provider: MA Durenda Guthrie, [place] TTHEW Other Clinician: Referring Provider: Treating Provider/Extender: Fredirick Maudlin MA Durenda Guthrie, MA TTHEW Weeks in Treatment: 4 Edema Assessment Assessed: [Left: No] [Right: No] Edema: [Left: Ye] [Right: s] Calf Left: Right: Point of Measurement:  From Medial Instep 41.2 cm Ankle Left: Right: Point of Measurement: From Medial Instep 24.3 cm Vascular Assessment Pulses: Dorsalis Pedis Palpable: [Right:Yes] Electronic Signature(s) Signed: 05/09/2022 4:25:48 PM By: Adline Peals Entered By: Adline Peals on 05/09/2022 08:35:30 -------------------------------------------------------------------------------- Multi Wound Chart Details Patient Name: Date of Service: Robert Brennan, Robert Brennan 05/09/2022 8:15 A M Medical Record Number: [id number] Patient Account Number: [id number] Date of Birth/Sex: Treating RN: [birth year] (51 y.o. M) Primary Care Provider: MA Durenda Guthrie, MA TTHEW Other Clinician: Referring Provider: Treating Provider/Extender: Fredirick Maudlin  MA THIA S, MA TTHEW Weeks in Treatment: 4 Vital Signs Height(in): 72 Pulse(bpm): 108 Weight(lbs): 280 Blood Pressure(mmHg): 172/84 Body Mass Index(BMI): 38 Temperature(F): 98.2 Respiratory Rate(breaths/min): 18 Photos: [N/A:N/A] Right, Lateral Lower Leg Right, Distal, Lateral Lower Leg N/A Wound Location: Trauma Trauma N/A Wounding Event: Diabetic Wound/Ulcer of the Lower Diabetic Wound/Ulcer of the Lower N/A 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 N/A Secondary Etiology: Hypertension, Type II Diabetes, Gout, Hypertension, Type II Diabetes, Gout, N/A Comorbid History: Neuropathy Neuropathy 07/24/2021 09/26/2021 N/A Date Acquired: 4 4 N/A Weeks of Treatment: Open Open N/A Wound Status: No No N/A Wound Recurrence: 0.3x0.3x0.1 1.7x1.6x0.1 N/A Measurements L x W x D (cm) 0.071 2.136 N/A A (cm) : rea 0.007 0.214 N/A Volume (cm) : 98.80% 24.70% N/A % Reduction in A rea: 98.90% 24.60% N/A % Reduction in Volume: Grade 2 Grade 1 N/A Classification: Small Medium N/A Exudate A mount: Serosanguineous Serosanguineous N/A Exudate Type: red, brown red, brown N/A Exudate Color: Flat and Intact Flat and Intact N/A Wound  Margin: Large (67-100%) Large (67-100%) N/A Granulation A mount: Red Red N/A Granulation Quality: None Present (0%) Small (1-33%) N/A Necrotic A mount: Fat Layer (Subcutaneous Tissue): Yes Fat Layer (Subcutaneous Tissue): Yes N/A Exposed Structures: Fascia: No Fascia: No Tendon: No Tendon: No Muscle: No Muscle: No Joint: No Joint: No Bone: No Bone: No Medium (34-66%) Medium (34-66%) N/A Epithelialization: Debridement - Selective/Open Wound Debridement - Selective/Open Wound N/A Debridement: Pre-procedure Verification/Time Out 08:55 08:55 N/A Taken: Lidocaine 4% Topical Solution Lidocaine 4% Topical Solution N/A Pain Control: [employer] N/A Tissue Debrided: Non-Viable Tissue Non-Viable Tissue N/A Level: 0.09 2.72 N/A Debridement A (sq cm): rea Curette Curette N/A Instrument: Minimum Minimum N/A Bleeding: Pressure Pressure N/A Hemostasis A chieved: 0 0 N/A Procedural Pain: 0 0 N/A Post Procedural Pain: Procedure was tolerated well Procedure was tolerated well N/A Debridement Treatment Response: 0.3x0.3x0.1 1.7x1.6x0.1 N/A Post Debridement Measurements L x W x D (cm) 0.007 0.214 N/A Post Debridement Volume: (cm) Compression Therapy Debridement N/A Procedures Performed: Debridement Treatment Notes Electronic Signature(s) Signed: 05/09/2022 9:12:03 AM By: Fredirick Maudlin MD FACS Entered By: Fredirick Maudlin on 05/09/2022 09:12:03 -------------------------------------------------------------------------------- Multi-Disciplinary Care Plan Details Patient Name: Date of Service: Robert Brennan, Robert Brennan 05/09/2022 8:15 A M Medical Record Number: [id number] Patient Account Number: [id number] Date of Birth/Sex: Treating RN: 08-17-1971 (51 y.o. Robert Brennan Primary Care Provider: MA Durenda Guthrie, [place] TTHEW Other Clinician: Referring Provider: Treating Provider/Extender: Fredirick Maudlin MA Durenda Guthrie, MA TTHEW Weeks in Treatment: 4 Multidisciplinary Care Plan reviewed  with physician Active Inactive Nutrition Nursing Diagnoses: Impaired glucose control: actual or potential Potential for alteratiion in Nutrition/Potential for imbalanced nutrition Goals: Patient/caregiver will maintain therapeutic glucose control Date Initiated: 04/11/2022 Target Resolution Date: 06/23/2022 Goal Status: Active Interventions: Assess HgA1c results as ordered upon admission and as needed Assess patient nutrition upon admission and as needed per policy Treatment Activities: Dietary management education, guidance and counseling : 04/10/2022 Giving encouragement to exercise : 04/10/2022 Patient referred to Primary Care Physician for further nutritional evaluation : 04/10/2022 Notes: Venous Leg Ulcer Nursing Diagnoses: Knowledge deficit related to disease process and management Potential for venous Insuffiency (use before diagnosis confirmed) Goals: Patient will maintain optimal edema control Date Initiated: 04/11/2022 Target Resolution Date: 06/23/2022 Goal Status: Active Interventions: Assess peripheral edema status every visit. Compression as ordered Provide education on venous insufficiency Treatment Activities: Therapeutic compression applied : 04/10/2022 Notes: Wound/Skin Impairment Nursing Diagnoses: Impaired tissue integrity Knowledge deficit related to ulceration/compromised skin integrity  Goals: Patient/caregiver will verbalize understanding of skin care regimen Date Initiated: 04/11/2022 Date Inactivated: 05/02/2022 Target Resolution Date: 05/09/2022 Goal Status: Met Ulcer/skin breakdown will have a volume reduction of 30% by week 4 Date Initiated: 04/11/2022 Target Resolution Date: 05/09/2022 Goal Status: Active Interventions: Assess patient/caregiver ability to obtain necessary supplies Assess patient/caregiver ability to perform ulcer/skin care regimen upon admission and as needed Assess ulceration(s) every visit Provide education on ulcer and skin  care Treatment Activities: Skin care regimen initiated : 04/10/2022 Topical wound management initiated : 04/10/2022 Notes: Electronic Signature(s) Signed: 05/09/2022 4:25:48 PM By: Adline Peals Entered By: Adline Peals on 05/09/2022 08:36:50 -------------------------------------------------------------------------------- Pain Assessment Details Patient Name: Date of Service: Robert Brennan, Robert Brennan 05/09/2022 8:15 A M Medical Record Number: [id number] Patient Account Number: [id number] Date of Birth/Sex: Treating RN: 08/08/1971 (51 y.o. Robert Brennan Primary Care Provider: MA Durenda Guthrie, [place] TTHEW Other Clinician: Referring Provider: Treating Provider/Extender: Fredirick Maudlin MA Durenda Guthrie, MA TTHEW Weeks in Treatment: 4 Active Problems Location of Pain Severity and Description of Pain Patient Has Paino No Site Locations Rate the pain. Current Pain Level: 0 Pain Management and Medication Current Pain Management: Electronic Signature(s) Signed: 05/09/2022 4:25:48 PM By: Adline Peals Signed: 05/09/2022 4:25:48 PM By: Adline Peals Entered By: Adline Peals on 05/09/2022 08:33:25 -------------------------------------------------------------------------------- Patient/Caregiver Education Details Patient Name: Date of Service: Robert Brennan, Robert Brennan 8/15/2023andnbsp8:15 A M Medical Record Number: [id number] Patient Account Number: [id number] Date of Birth/Gender: Treating RN: [birth year] (51 y.o. Robert Brennan Primary Care Physician: MA Durenda Guthrie, [place] TTHEW Other Clinician: Referring Physician: Treating Physician/Extender: Fredirick Maudlin MA Durenda Guthrie, [place] TTHEW Weeks in Treatment: 4 Education Assessment Education Provided To: Patient Education Topics Provided Wound/Skin Impairment: Methods: Explain/Verbal Responses: Reinforcements needed, State content correctly Electronic Signature(s) Signed: 05/09/2022 4:25:48 PM By: Adline Peals Entered By: Adline Peals on 05/09/2022 08:37:02 -------------------------------------------------------------------------------- Wound Assessment Details Patient Name: Date of Service: Robert Brennan, Robert Brennan 05/09/2022 8:15 A M Medical Record Number: [id number] Patient Account Number: [id number] Date of Birth/Sex: Treating RN: [birth year] (51 y.o. Robert Brennan Primary Care Provider: MA Durenda Guthrie, [place] TTHEW Other Clinician: Referring Provider: Treating Provider/Extender: Fredirick Maudlin MA Durenda Guthrie, [place] TTHEW Weeks in Treatment: 4 Wound Status Wound Number: 1 Primary Etiology: Diabetic Wound/Ulcer of the Lower Extremity Wound Location: Right, Lateral Lower Leg Secondary Etiology: Venous Leg Ulcer Wounding Event: Trauma Wound Status: Open Date Acquired: 07/24/2021 Comorbid History: Hypertension, Type II Diabetes, Gout, Neuropathy Weeks Of Treatment: 4 Clustered Wound: No Photos Wound Measurements Length: (cm) 0.3 Width: (cm) 0.3 Depth: (cm) 0.1 Area: (cm) 0.071 Volume: (cm) 0.007 % Reduction in Area: 98.8% % Reduction in Volume: 98.9% Epithelialization: Medium (34-66%) Tunneling: No Undermining: No Wound Description Classification: Grade 2 Wound Margin: Flat and Intact Exudate Amount: Small Exudate Type: Serosanguineous Exudate Color: red, brown Foul Odor After Cleansing: No Slough/Fibrino No Wound Bed Granulation Amount: Large (67-100%) Exposed Structure Granulation Quality: Red Fascia Exposed: No Necrotic Amount: None Present (0%) Fat Layer (Subcutaneous Tissue) Exposed: Yes Tendon Exposed: No Muscle Exposed: No Joint Exposed: No Bone Exposed: No Treatment Notes Wound #1 (Lower Leg) Wound Laterality: Right, Lateral Cleanser Peri-Wound Care Sween Lotion (Moisturizing lotion) Discharge Instruction: Apply moisturizing lotion as directed Topical Primary Dressing Promogran Prisma Matrix, 4.34 (sq in) (silver collagen) Discharge Instruction: Moisten collagen with saline or  hydrogel Secondary Dressing Woven Gauze Sponge, Non-Sterile 4x4 in Discharge Instruction: Apply over primary dressing as directed. Secured With Compression Wrap ThreePress (3 layer compression wrap) Discharge Instruction: Apply three layer compression as directed.  Compression Stockings Add-Ons Electronic Signature(s) Signed: 05/09/2022 5:03:29 PM By: Baruch Gouty RN, BSN Entered By: Baruch Gouty on 05/09/2022 08:50:09 -------------------------------------------------------------------------------- Wound Assessment Details Patient Name: Date of Service: Robert Brennan, Robert Brennan 05/09/2022 8:15 A M Medical Record Number: [id number] Patient Account Number: [id number] Date of Birth/Sex: Treating RN: [birth year] (51 y.o. Robert Brennan Primary Care Robert Brennan: MA Durenda Guthrie, [place] TTHEW Other Clinician: Referring Mckenzie Toruno: Treating Charvi Gammage/Extender: Fredirick Maudlin MA Durenda Guthrie, [place] TTHEW Weeks in Treatment: 4 Wound Status Wound Number: 2 Primary Etiology: Diabetic Wound/Ulcer of the Lower Extremity Wound Location: Right, Distal, Lateral Lower Leg Secondary Etiology: Venous Leg Ulcer Wounding Event: Trauma Wound Status: Open Date Acquired: 09/26/2021 Comorbid History: Hypertension, Type II Diabetes, Gout, Neuropathy Weeks Of Treatment: 4 Clustered Wound: No Photos Wound Measurements Length: (cm) 1.7 Width: (cm) 1.6 Depth: (cm) 0.1 Area: (cm) 2.136 Volume: (cm) 0.214 % Reduction in Area: 24.7% % Reduction in Volume: 24.6% Epithelialization: Medium (34-66%) Tunneling: No Undermining: No Wound Description Classification: Grade 1 Wound Margin: Flat and Intact Exudate Amount: Medium Exudate Type: Serosanguineous Exudate Color: red, brown Foul Odor After Cleansing: No Slough/Fibrino Yes Wound Bed Granulation Amount: Large (67-100%) Exposed Structure Granulation Quality: Red Fascia Exposed: No Necrotic Amount: Small (1-33%) Fat Layer (Subcutaneous Tissue) Exposed: Yes Necrotic  Quality: Adherent Slough Tendon Exposed: No Muscle Exposed: No Joint Exposed: No Bone Exposed: No Treatment Notes Wound #2 (Lower Leg) Wound Laterality: Right, Lateral, Distal Cleanser Peri-Wound Care Sween Lotion (Moisturizing lotion) Discharge Instruction: Apply moisturizing lotion as directed Topical Primary Dressing Promogran Prisma Matrix, 4.34 (sq in) (silver collagen) Discharge Instruction: Moisten collagen with saline or hydrogel Secondary Dressing Woven Gauze Sponge, Non-Sterile 4x4 in Discharge Instruction: Apply over primary dressing as directed. Secured With Compression Wrap ThreePress (3 layer compression wrap) Discharge Instruction: Apply three layer compression as directed. Compression Stockings Add-Ons Electronic Signature(s) Signed: 05/09/2022 4:25:48 PM By: Adline Peals Entered By: Adline Peals on 05/09/2022 08:39:06 -------------------------------------------------------------------------------- Vitals Details Patient Name: Date of Service: Robert Brennan, Robert Brennan 05/09/2022 8:15 A M Medical Record Number: [id number] Patient Account Number: [id number] Date of Birth/Sex: Treating RN: 10-10-1970 (51 y.o. Robert Brennan Primary Care Tyshon Fanning: MA Durenda Guthrie, [place] TTHEW Other Clinician: Referring Darick Fetters: Treating Jentry Mcqueary/Extender: Fredirick Maudlin MA Durenda Guthrie, [place] TTHEW Weeks in Treatment: 4 Vital Signs Time Taken: 08:32 Temperature (F): 98.2 Height (in): 72 Pulse (bpm): 108 Weight (lbs): 280 Respiratory Rate (breaths/min): 18 Body Mass Index (BMI): 38 Blood Pressure (mmHg): 172/84 Reference Range: 80 - 120 mg / dl Electronic Signature(s) Signed: 05/09/2022 4:25:48 PM By: Adline Peals Entered By: Adline Peals on 05/09/2022 08:33:15

## 2022-05-12 NOTE — Progress Notes (Signed)
Robert, Bagshaw Brennan ([id number]) Visit Report for 05/02/2022 Arrival Information Details Patient Name: Date of Service: Robert Brennan, Robert Brennan 05/02/2022 8:30 A M Medical Record Number: [id number] Patient Account Number: [id number] Date of Birth/Sex: Treating RN: Jun 07, 1971 (51 y.o. Robert Brennan Primary Care Robert Brennan: [place] Robert Brennan, [place] Robert Brennan Other Clinician: Referring Robert Brennan: Treating Robert Brennan/Extender: Robert Maudlin MA Robert Guthrie, MA Robert Brennan Weeks in Treatment: 3 Visit Information History Since Last Visit Added or deleted any medications: No Patient Arrived: Ambulatory Any new allergies or adverse reactions: No Arrival Time: 08:30 Had a fall or experienced change in No Accompanied By: self activities of daily living that may affect Transfer Assistance: None risk of falls: Patient Identification Verified: Yes Signs or symptoms of abuse/neglect since last visito No Secondary Verification Process Completed: Yes Hospitalized since last visit: No Patient Requires Transmission-Based Precautions: No Implantable device outside of the clinic excluding No Patient Has Alerts: No cellular tissue based products placed in the center since last visit: Has Dressing in Place as Prescribed: Yes Has Compression in Place as Prescribed: Yes Pain Present Now: No Electronic Signature(s) Signed: 05/02/2022 4:38:24 PM By: Robert Brennan Entered By: Robert Brennan on 05/02/2022 08:36:24 -------------------------------------------------------------------------------- Encounter Discharge Information Details Patient Name: Date of Service: Robert Brennan, Robert Brennan 05/02/2022 8:30 A M Medical Record Number: [id number] Patient Account Number: [id number] Date of Birth/Sex: Treating RN: [birth year] (51 y.o. Robert Brennan Primary Care Robert Brennan: [place] Robert Brennan, [place] Robert Brennan Other Clinician: Referring Robert Brennan: Treating Robert Brennan/Extender: Robert Maudlin MA Robert Guthrie, MA Robert Brennan Weeks in Treatment: 3 Encounter Discharge Information Items  Post Procedure Vitals Discharge Condition: Stable Temperature (F): 98.2 Ambulatory Status: Ambulatory Pulse (bpm): 105 Discharge Destination: Home Respiratory Rate (breaths/min): 18 Transportation: Private Auto Blood Pressure (mmHg): 143/78 Accompanied By: self Schedule Follow-up Appointment: Yes Clinical Summary of Care: Patient Declined Electronic Signature(s) Signed: 05/02/2022 4:38:24 PM By: Robert Brennan Entered By: Robert Brennan on 05/02/2022 09:00:54 -------------------------------------------------------------------------------- Lower Extremity Assessment Details Patient Name: Date of Service: Robert Brennan, Robert Brennan 05/02/2022 8:30 A M Medical Record Number: [id number] Patient Account Number: [id number] Date of Birth/Sex: Treating RN: Nov 19, 1970 (51 y.o. Robert Brennan Primary Care Dameisha Tschida: MA Robert Brennan, [place] Robert Brennan Other Clinician: Referring Gatlin Kittell: Treating Rogue Rafalski/Extender: Robert Maudlin MA Robert Guthrie, MA Robert Brennan Weeks in Treatment: 3 Edema Assessment Assessed: [Left: No] [Right: No] Edema: [Left: Ye] [Right: s] Calf Left: Right: Point of Measurement: From Medial Instep 41.5 cm Ankle Left: Right: Point of Measurement: From Medial Instep 24 cm Vascular Assessment Pulses: Dorsalis Pedis Palpable: [Right:Yes] Electronic Signature(s) Signed: 05/02/2022 4:38:24 PM By: Robert Brennan Entered By: Robert Brennan on 05/02/2022 08:41:43 -------------------------------------------------------------------------------- Multi Wound Chart Details Patient Name: Date of Service: Robert Brennan, Robert Brennan 05/02/2022 8:30 A M Medical Record Number: [id number] Patient Account Number: [id number] Date of Birth/Sex: Treating RN: [birth year] (51 y.o. Robert Brennan Primary Care Kati Riggenbach: MA Robert Brennan, [place] Robert Brennan Other Clinician: Referring Shomari Scicchitano: Treating Milarose Savich/Extender: Robert Maudlin MA Robert Guthrie, MA Robert Brennan Weeks in Treatment: 3 Vital Signs Height(in): 72 Capillary Blood  Glucose(mg/dl): 120 Weight(lbs): 280 Pulse(bpm): 105 Body Mass Index(BMI): 67 Blood Pressure(mmHg): 143/78 Temperature(F): 98.2 Respiratory Rate(breaths/min): 18 Photos: [N/A:N/A] Right, Lateral Lower Leg Right, Distal, Lateral Lower Leg N/A Wound Location: Trauma Trauma N/A Wounding Event: Diabetic Wound/Ulcer of the Lower Diabetic Wound/Ulcer of the Lower N/A 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 N/A Secondary Etiology: Hypertension, Type II Diabetes, Gout, Hypertension, Type II Diabetes, Gout, N/A Comorbid History: Neuropathy Neuropathy 07/24/2021 09/26/2021 N/A Date Acquired: 3 3 N/A Weeks of Treatment: Open Open N/A  Wound Status: No No N/A Wound Recurrence: 0.4x0.4x0.1 1.6x1.6x0.1 N/A Measurements L x W x D (cm) 0.126 2.011 N/A A (cm) : rea 0.013 0.201 N/A Volume (cm) : 97.90% 29.10% N/A % Reduction in A rea: 97.90% 29.20% N/A % Reduction in Volume: Grade 2 Grade 1 N/A Classification: Medium Medium N/A Exudate A mount: Serosanguineous Serosanguineous N/A Exudate Type: red, brown red, brown N/A Exudate Color: Flat and Intact Flat and Intact N/A Wound Margin: Large (67-100%) Large (67-100%) N/A Granulation A mount: Red Red N/A Granulation Quality: None Present (0%) Small (1-33%) N/A Necrotic A mount: Fat Layer (Subcutaneous Tissue): Yes Fat Layer (Subcutaneous Tissue): Yes N/A Exposed Structures: Fascia: No Fascia: No Tendon: No Tendon: No Muscle: No Muscle: No Joint: No Joint: No Bone: No Bone: No Medium (34-66%) Medium (34-66%) N/A Epithelialization: Debridement - Selective/Open Wound Debridement - Excisional N/A Debridement: Pre-procedure Verification/Time Out 08:47 08:47 N/A Taken: Lidocaine 4% Topical Solution Lidocaine 4% Topical Solution N/A Pain Control: Necrotic/Eschar Necrotic/Eschar, Subcutaneous, N/A Tissue Debrided: Slough Non-Viable Tissue Skin/Subcutaneous Tissue N/A Level: 0.16 2.56  N/A Debridement A (sq cm): rea Curette Curette N/A Instrument: Minimum Minimum N/A Bleeding: Pressure Pressure N/A Hemostasis Achieved: 0 0 N/A Procedural Pain: 0 0 N/A Post Procedural Pain: Procedure was tolerated well Procedure was tolerated well N/A Debridement Treatment Response: 0.4x0.4x0.1 1.6x1.6x0.1 N/A Post Debridement Measurements L x W x D (cm) 0.013 0.201 N/A Post Debridement Volume: (cm) Debridement Debridement N/A Procedures Performed: Treatment Notes Wound #1 (Lower Leg) Wound Laterality: Right, Lateral Cleanser Peri-Wound Care Sween Lotion (Moisturizing lotion) Discharge Instruction: Apply moisturizing lotion as directed Topical Primary Dressing Promogran Prisma Matrix, 4.34 (sq in) (silver collagen) Discharge Instruction: Moisten collagen with saline or hydrogel Secondary Dressing Woven Gauze Sponge, Non-Sterile 4x4 in Discharge Instruction: Apply over primary dressing as directed. Secured With Compression Wrap ThreePress (3 layer compression wrap) Discharge Instruction: Apply three layer compression as directed. Compression Stockings Add-Ons Wound #2 (Lower Leg) Wound Laterality: Right, Lateral, Distal Cleanser Peri-Wound Care Sween Lotion (Moisturizing lotion) Discharge Instruction: Apply moisturizing lotion as directed Topical Primary Dressing Promogran Prisma Matrix, 4.34 (sq in) (silver collagen) Discharge Instruction: Moisten collagen with saline or hydrogel Secondary Dressing Woven Gauze Sponge, Non-Sterile 4x4 in Discharge Instruction: Apply over primary dressing as directed. Secured With Compression Wrap ThreePress (3 layer compression wrap) Discharge Instruction: Apply three layer compression as directed. Compression Stockings Add-Ons Electronic Signature(s) Signed: 05/02/2022 9:06:50 AM By: Robert Maudlin MD FACS Signed: 05/02/2022 4:38:24 PM By: Sabas Sous By: Robert Brennan on 05/02/2022  09:06:50 -------------------------------------------------------------------------------- Multi-Disciplinary Care Plan Details Patient Name: Date of Service: Robert Brennan, Robert Brennan 05/02/2022 8:30 A M Medical Record Number: [id number] Patient Account Number: [id number] Date of Birth/Sex: Treating RN: 10-19-1970 (51 y.o. Robert Brennan Primary Care Trennon Torbeck: MA Robert Brennan, [place] Robert Brennan Other Clinician: Referring Maryna Yeagle: Treating Kieren Ricci/Extender: Robert Maudlin MA Robert Guthrie, MA Robert Brennan Weeks in Treatment: 3 Multidisciplinary Care Plan reviewed with physician Active Inactive Nutrition Nursing Diagnoses: Impaired glucose control: actual or potential Potential for alteratiion in Nutrition/Potential for imbalanced nutrition Goals: Patient/caregiver will maintain therapeutic glucose control Date Initiated: 04/11/2022 Target Resolution Date: 06/23/2022 Goal Status: Active Interventions: Assess HgA1c results as ordered upon admission and as needed Assess patient nutrition upon admission and as needed per policy Treatment Activities: Dietary management education, guidance and counseling : 04/10/2022 Giving encouragement to exercise : 04/10/2022 Patient referred to Primary Care Physician for further nutritional evaluation : 04/10/2022 Notes: Venous Leg Ulcer Nursing Diagnoses: Knowledge deficit related to disease process and management Potential for venous  Insuffiency (use before diagnosis confirmed) Goals: Patient will maintain optimal edema control Date Initiated: 04/11/2022 Target Resolution Date: 06/23/2022 Goal Status: Active Interventions: Assess peripheral edema status every visit. Compression as ordered Provide education on venous insufficiency Treatment Activities: Therapeutic compression applied : 04/10/2022 Notes: Wound/Skin Impairment Nursing Diagnoses: Impaired tissue integrity Knowledge deficit related to ulceration/compromised skin integrity Goals: Patient/caregiver will  verbalize understanding of skin care regimen Date Initiated: 04/11/2022 Date Inactivated: 05/02/2022 Target Resolution Date: 05/09/2022 Goal Status: Met Ulcer/skin breakdown will have a volume reduction of 30% by week 4 Date Initiated: 04/11/2022 Target Resolution Date: 05/09/2022 Goal Status: Active Interventions: Assess patient/caregiver ability to obtain necessary supplies Assess patient/caregiver ability to perform ulcer/skin care regimen upon admission and as needed Assess ulceration(s) every visit Provide education on ulcer and skin care Treatment Activities: Skin care regimen initiated : 04/10/2022 Topical wound management initiated : 04/10/2022 Notes: Electronic Signature(s) Signed: 05/02/2022 4:38:24 PM By: Robert Brennan Entered By: Robert Brennan on 05/02/2022 08:45:43 -------------------------------------------------------------------------------- Pain Assessment Details Patient Name: Date of Service: Robert Brennan, Robert Brennan 05/02/2022 8:30 A M Medical Record Number: [id number] Patient Account Number: [id number] Date of Birth/Sex: Treating RN: [birth year] (51 y.o. Robert Brennan Primary Care Korrina Zern: MA Robert Brennan, [place] Robert Brennan Other Clinician: Referring Special Ranes: Treating Rufina Kimery/Extender: Robert Maudlin MA Robert Guthrie, MA Robert Brennan Weeks in Treatment: 3 Active Problems Location of Pain Severity and Description of Pain Patient Has Paino No Site Locations Rate the pain. Rate the pain. Current Pain Level: 0 Pain Management and Medication Current Pain Management: Electronic Signature(s) Signed: 05/02/2022 4:38:24 PM By: Robert Brennan Entered By: Robert Brennan on 05/02/2022 08:37:50 -------------------------------------------------------------------------------- Patient/Caregiver Education Details Patient Name: Date of Service: Robert Brennan, Gibbs 8/8/2023andnbsp8:30 A M Medical Record Number: [id number] Patient Account Number: [id number] Date of Birth/Gender: Treating  RN: 24-Dec-1970 (51 y.o. Robert Brennan Primary Care Physician: MA Robert Brennan, [place] Robert Brennan Other Clinician: Referring Physician: Treating Physician/Extender: Robert Maudlin MA Robert Guthrie, MA Robert Brennan Weeks in Treatment: 3 Education Assessment Education Provided To: Patient Education Topics Provided Wound/Skin Impairment: Methods: Explain/Verbal Responses: Reinforcements needed, State content correctly Electronic Signature(s) Signed: 05/02/2022 4:38:24 PM By: Robert Brennan Entered By: Robert Brennan on 05/02/2022 08:45:52 -------------------------------------------------------------------------------- Wound Assessment Details Patient Name: Date of Service: Robert Brennan, Robert Brennan 05/02/2022 8:30 A M Medical Record Number: [id number] Patient Account Number: [id number] Date of Birth/Sex: Treating RN: [birth year] (51 y.o. Robert Brennan Primary Care Calaya Gildner: MA Robert Brennan, [place] Robert Brennan Other Clinician: Referring Eular Panek: Treating Daxx Tiggs/Extender: Robert Maudlin MA Robert Guthrie, MA Robert Brennan Weeks in Treatment: 3 Wound Status Wound Number: 1 Primary Etiology: Diabetic Wound/Ulcer of the Lower Extremity Wound Location: Right, Lateral Lower Leg Secondary Etiology: Venous Leg Ulcer Wounding Event: Trauma Wound Status: Open Date Acquired: 07/24/2021 Comorbid History: Hypertension, Type II Diabetes, Gout, Neuropathy Weeks Of Treatment: 3 Clustered Wound: No Photos Wound Measurements Length: (cm) 0.4 Width: (cm) 0.4 Depth: (cm) 0.1 Area: (cm) 0.126 Volume: (cm) 0.013 % Reduction in Area: 97.9% % Reduction in Volume: 97.9% Epithelialization: Medium (34-66%) Tunneling: No Undermining: No Wound Description Classification: Grade 2 Wound Margin: Flat and Intact Exudate Amount: Medium Exudate Type: Serosanguineous Exudate Color: red, brown Foul Odor After Cleansing: No Slough/Fibrino No Wound Bed Granulation Amount: Large (67-100%) Exposed Structure Granulation Quality: Red Fascia  Exposed: No Necrotic Amount: None Present (0%) Fat Layer (Subcutaneous Tissue) Exposed: Yes Tendon Exposed: No Muscle Exposed: No Joint Exposed: No Bone Exposed: No Electronic Signature(s) Signed: 05/02/2022 4:38:24 PM By: Robert Brennan Entered By: Robert Brennan on 05/02/2022 08:50:37 -------------------------------------------------------------------------------- Wound Assessment Details Patient  Name: Date of Service: Robert Brennan, Robert Brennan 05/02/2022 8:30 A M Medical Record Number: [id number] Patient Account Number: [id number] Date of Birth/Sex: Treating RN: 10/25/1970 (51 y.o. Robert Brennan Primary Care Dorsie Burich: MA Robert Brennan, [place] Robert Brennan Other Clinician: Referring Renelda Kilian: Treating Garrick Midgley/Extender: Robert Maudlin MA Robert Guthrie, MA Robert Brennan Weeks in Treatment: 3 Wound Status Wound Number: 2 Primary Etiology: Diabetic Wound/Ulcer of the Lower Extremity Wound Location: Right, Distal, Lateral Lower Leg Secondary Etiology: Venous Leg Ulcer Wounding Event: Trauma Wound Status: Open Date Acquired: 09/26/2021 Comorbid History: Hypertension, Type II Diabetes, Gout, Neuropathy Weeks Of Treatment: 3 Clustered Wound: No Photos Wound Measurements Length: (cm) 1.6 Width: (cm) 1.6 Depth: (cm) 0.1 Area: (cm) 2.011 Volume: (cm) 0.201 % Reduction in Area: 29.1% % Reduction in Volume: 29.2% Epithelialization: Medium (34-66%) Tunneling: No Undermining: No Wound Description Classification: Grade 1 Wound Margin: Flat and Intact Exudate Amount: Medium Exudate Type: Serosanguineous Exudate Color: red, brown Foul Odor After Cleansing: No Slough/Fibrino Yes Wound Bed Granulation Amount: Large (67-100%) Exposed Structure Granulation Quality: Red Fascia Exposed: No Necrotic Amount: Small (1-33%) Fat Layer (Subcutaneous Tissue) Exposed: Yes Necrotic Quality: Adherent Slough Tendon Exposed: No Muscle Exposed: No Joint Exposed: No Bone Exposed: No Electronic Signature(s) Signed:  05/02/2022 4:38:24 PM By: Robert Brennan Signed: 05/12/2022 8:07:51 PM By: Donavan Burnet CHT EMT BS , , Entered By: Donavan Burnet on 05/02/2022 08:43:41 -------------------------------------------------------------------------------- Vitals Details Patient Name: Date of Service: Robert Brennan, Robert Brennan 05/02/2022 8:30 A M Medical Record Number: [id number] Patient Account Number: [id number] Date of Birth/Sex: Treating RN: Mar 19, 1971 (51 y.o. Robert Brennan Primary Care Euel Castile: MA Robert Brennan, [place] Robert Brennan Other Clinician: Referring Tayo Maute: Treating Emmalina Espericueta/Extender: Robert Maudlin MA Robert Guthrie, MA Robert Brennan Weeks in Treatment: 3 Vital Signs Time Taken: 08:37 Temperature (F): 98.2 Height (in): 72 Pulse (bpm): 105 Weight (lbs): 280 Respiratory Rate (breaths/min): 18 Body Mass Index (BMI): 38 Blood Pressure (mmHg): 143/78 Capillary Blood Glucose (mg/dl): 120 Reference Range: 80 - 120 mg / dl Electronic Signature(s) Signed: 05/02/2022 4:38:24 PM By: Robert Brennan Signed: 05/02/2022 4:38:24 PM By: Robert Brennan Entered By: Robert Brennan on 05/02/2022 08:37:45

## 2022-05-17 ENCOUNTER — Encounter (HOSPITAL_BASED_OUTPATIENT_CLINIC_OR_DEPARTMENT_OTHER): Payer: BC Managed Care – PPO | Admitting: General Surgery

## 2022-05-17 DIAGNOSIS — E11621 Type 2 diabetes mellitus with foot ulcer: Secondary | ICD-10-CM | POA: Diagnosis not present

## 2022-05-17 NOTE — Progress Notes (Signed)
[name], [name] ([id number]) Visit Report for 05/17/2022 Arrival Information Details Patient Name: Date of Service: Robert Brennan, Robert Brennan 05/17/2022 8:15 A M Medical Record Number: [id number] Patient Account Number: [id number] Date of Birth/Sex: Treating RN: 1971-08-24 (51 y.o. [name] Primary Care Dezmond Downie: MA Durenda [place], [place] TTHEW Other Clinician: Referring Sie Formisano: Treating Arnell Mausolf/Extender: Fredirick Maudlin MA Durenda [place], [place] TTHE[place] Weeks in Treatment: 5 Visit Information History Since Last Visit Added or deleted any medications: No Patient Arrived: Ambulatory Any new allergies or adverse reactions: No Arrival Time: 08:38 Had a fall or experienced change in No Accompanied By: self activities of daily living that may affect Transfer Assistance: None risk of falls: Patient Identification Verified: Yes Signs or symptoms of abuse/neglect since last visito No Secondary Verification Process Completed: Yes Hospitalized since last visit: No Patient Requires Transmission-Based Precautions: No Implantable device outside of the clinic excluding No Patient Has Alerts: No cellular tissue based products placed in the center since last visit: Has Dressing in Place as Prescribed: Yes Has Compression in Place as Prescribed: Yes Pain Present Now: No Electronic Signature(s) Signed: 05/17/2022 6:18:08 PM By: Baruch Gouty RN, BSN Entered By: Baruch Gouty on 05/17/2022 08:38:40 -------------------------------------------------------------------------------- Compression Therapy Details Patient Name: Date of Service: Robert Brennan, Robert Brennan 05/17/2022 8:15 A M Medical Record Number: [id number] Patient Account Number: [id number] Date of Birth/Sex: Treating RN: 10-07-1970 (51 y.o. [name] Primary Care Tatiyanna Lashley: MA Durenda G[place], [place] TTHEW Other Clinician: Referring Jaree Dwight: Treating Surah Pelley/Extender: Fredirick Maudlin MA Durenda [place], [place] TTHE[place] Weeks in Treatment: 5 Compression Therapy Performed for Wound Assessment:  Wound #2 Right,Distal,Lateral Lower Leg Performed By: Clinician Baruch Gouty, RN Compression Type: Three Layer Post Procedure Diagnosis Same as Pre-procedure Electronic Signature(s) Signed: 05/17/2022 6:18:08 PM By: Baruch Gouty RN, BSN Entered By: Baruch Gouty on 05/17/2022 08:54:34 -------------------------------------------------------------------------------- Encounter Discharge Information Details Patient Name: Date of Service: Robert Brennan, Robert Brennan 05/17/2022 8:15 A M Medical Record Number: [id number] Patient Account Number: [id number] Date of Birth/Sex: Treating RN: March 01, 1971 (51 y.o. [name] Primary Care Leeyah Heather: MA Durenda [place], [place] TTHEW Other Clinician: Referring Derrick Orris: Treating Josias Tomerlin/Extender: Fredirick Maudlin MA Durenda [place], [place] TTHE[place] Weeks in Treatment: 5 Encounter Discharge Information Items Post Procedure Vitals Discharge Condition: Stable Temperature (F): 98.5 Ambulatory Status: Ambulatory Pulse (bpm): 89 Discharge Destination: Home Respiratory Rate (breaths/min): 18 Transportation: Private Auto Blood Pressure (mmHg): 148/96 Accompanied By: self Schedule Follow-up Appointment: Yes Clinical Summary of Care: Patient Declined Electronic Signature(s) Signed: 05/17/2022 6:18:08 PM By: Baruch Gouty RN, BSN Entered By: Baruch Gouty on 05/17/2022 09:17:34 -------------------------------------------------------------------------------- Lower Extremity Assessment Details Patient Name: Date of Service: Robert Brennan, Robert Brennan 05/17/2022 8:15 A M Medical Record Number: [id number] Patient Account Number: [id number] Date of Birth/Sex: Treating RN: 19-Feb-1971 (51 y.o. [name] Primary Care Leanna Hamid: MA Durenda [place], [place] TTHEW Other Clinician: Referring Salli Bodin: Treating Marvel Mcphillips/Extender: Fredirick Maudlin MA Durenda [place], [place] T[place] Weeks in Treatment: 5 Edema Assessment Assessed: [Left: No] [Right: No] Edema: [Left: Ye] [Right: s] Calf Left: Right: Point of  Measurement: From Medial Instep 39.8 cm Ankle Left: Right: Point of Measurement: From Medial Instep 24 cm Vascular Assessment Pulses: Dorsalis Pedis Palpable: [Right:Yes] Electronic Signature(s) Signed: 05/17/2022 6:18:08 PM By: Baruch Gouty RN, BSN Entered By: Baruch Gouty on 05/17/2022 08:42:18 -------------------------------------------------------------------------------- Multi Wound Chart Details Patient Name: Date of Service: Robert Brennan, Robert Brennan 05/17/2022 8:15 A M Medical Record Number: [id number] Patient Account Number: [id number] Date of Birth/Sex: Treating RN: [birth year] (51 y.o. [name] Primary Care Cedricka Sackrider: MA Durenda [place], [place] TTHEW Other Clinician:  Referring Davetta Olliff: Treating Valeriano Bain/Extender: Fredirick Maudlin MA THIA Chauncey Cruel, [place] TTHE[place] Weeks in Treatment: 5 Vital Signs Height(in): 72 Capillary Blood Glucose(mg/dl): 130 Weight(lbs): 280 Pulse(bpm): 23 Body Mass Index(BMI): 38 Blood Pressure(mmHg): 148/96 Temperature(F): 98.5 Respiratory Rate(breaths/min): 18 Photos: [N/A:N/A] Right, Lateral Lower Leg Right, Distal, Lateral Lower Leg N/A Wound Location: Trauma Trauma N/A Wounding Event: Diabetic Wound/Ulcer of the Lower Diabetic Wound/Ulcer of the Lower N/A 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 N/A Secondary Etiology: Hypertension, Type II Diabetes, Gout, Hypertension, Type II Diabetes, Gout, N/A Comorbid History: Neuropathy Neuropathy 07/24/2021 09/26/2021 N/A Date Acquired: 5 5 N/A Weeks of Treatment: Healed - Epithelialized Open N/A Wound Status: No No N/A Wound Recurrence: 0x0x0 1.6x1.6x0.1 N/A Measurements L x W x D (cm) 0 2.011 N/A A (cm) : rea 0 0.201 N/A Volume (cm) : 100.00% 29.10% N/A % Reduction in A rea: 100.00% 29.20% N/A % Reduction in Volume: Grade 2 Grade 1 N/A Classification: None Present Medium N/A Exudate A mount: N/A Serosanguineous N/A Exudate Type: N/A red, brown N/A Exudate  Color: N/A Flat and Intact N/A Wound Margin: None Present (0%) Large (67-100%) N/A Granulation A mount: N/A Red N/A Granulation Quality: None Present (0%) Small (1-33%) N/A Necrotic A mount: Fascia: No Fat Layer (Subcutaneous Tissue): Yes N/A Exposed Structures: Fat Layer (Subcutaneous Tissue): No Fascia: No Tendon: No Tendon: No Muscle: No Muscle: No Joint: No Joint: No Bone: No Bone: No Large (67-100%) Medium (34-66%) N/A Epithelialization: N/A Debridement - Selective/Open Wound N/A Debridement: Pre-procedure Verification/Time Out N/A 08:50 N/A Taken: N/A Lidocaine 4% Topical Solution N/A Pain Control: N/A Slough N/A Tissue Debrided: N/A Non-Viable Tissue N/A Level: N/A 2.56 N/A Debridement A (sq cm): rea N/A Curette N/A Instrument: N/A Minimum N/A Bleeding: N/A Pressure N/A Hemostasis A chieved: N/A 0 N/A Procedural Pain: N/A 0 N/A Post Procedural Pain: N/A Procedure was tolerated well N/A Debridement Treatment Response: N/A 1.6x1.6x0.1 N/A Post Debridement Measurements L x W x D (cm) N/A 0.201 N/A Post Debridement Volume: (cm) N/A Compression Therapy N/A Procedures Performed: Debridement Treatment Notes Electronic Signature(s) Signed: 05/17/2022 9:01:19 AM By: Fredirick Maudlin MD FACS Signed: 05/17/2022 6:18:08 PM By: Baruch Gouty RN, BSN Entered By: Fredirick Maudlin on 05/17/2022 09:01:19 -------------------------------------------------------------------------------- Multi-Disciplinary Care Plan Details Patient Name: Date of Service: Robert Brennan, Robert Brennan 05/17/2022 8:15 A M Medical Record Number: [id number] Patient Account Number: [id number] Date of Birth/Sex: Treating RN: April 29, 1971 (51 y.o. [name] Primary Care Rashon Westrup: MA Durenda [place], [place] TTHEW Other Clinician: Referring Devaughn Savant: Treating Adeja Sarratt/Extender: Fredirick Maudlin MA Du[place]da [place], [place] T[place] Weeks in Treatment: 5 Multidisciplinary Care Plan reviewed with physician Active  Inactive Nutrition Nursing Diagnoses: Impaired glucose control: actual or potential Potential for alteratiion in Nutrition/Potential for imbalanced nutrition Goals: Patient/caregiver will maintain therapeutic glucose control Date Initiated: 04/11/2022 Target Resolution Date: 06/23/2022 Goal Status: Active Interventions: Assess HgA1c results as ordered upon admission and as needed Assess patient nutrition upon admission and as needed per policy Treatment Activities: Dietary management education, guidance and counseling : 04/10/2022 Giving encouragement to exercise : 04/10/2022 Patient referred to Primary Care Physician for further nutritional evaluation : 04/10/2022 Notes: Venous Leg Ulcer Nursing Diagnoses: Knowledge deficit related to disease process and management Potential for venous Insuffiency (use before diagnosis confirmed) Goals: Patient will maintain optimal edema control Date Initiated: 04/11/2022 Target Resolution Date: 06/23/2022 Goal Status: Active Interventions: Assess peripheral edema status every visit. Compression as ordered Provide education on venous insufficiency Treatment Activities: Therapeutic compression applied : 04/10/2022 Notes: Wound/Skin Impairment Nursing Diagnoses:  Impaired tissue integrity Knowledge deficit related to ulceration/compromised skin integrity Goals: Patient/caregiver will verbalize understanding of skin care regimen Date Initiated: 04/11/2022 Date Inactivated: 05/02/2022 Target Resolution Date: 05/09/2022 Goal Status: Met Ulcer/skin breakdown will have a volume reduction of 30% by week 4 Date Initiated: 04/11/2022 Date Inactivated: 05/17/2022 Target Resolution Date: 05/09/2022 Goal Status: Met Ulcer/skin breakdown will have a volume reduction of 50% by week 8 Date Initiated: 05/17/2022 Target Resolution Date: 06/06/2022 Goal Status: Active Interventions: Assess patient/caregiver ability to obtain necessary supplies Assess  patient/caregiver ability to perform ulcer/skin care regimen upon admission and as needed Assess ulceration(s) every visit Provide education on ulcer and skin care Treatment Activities: Skin care regimen initiated : 04/10/2022 Topical wound management initiated : 04/10/2022 Notes: Electronic Signature(s) Signed: 05/17/2022 6:18:08 PM By: Baruch Gouty RN, BSN Entered By: Baruch Gouty on 05/17/2022 08:54:01 -------------------------------------------------------------------------------- Pain Assessment Details Patient Name: Date of Service: Robert Brennan, Robert Brennan 05/17/2022 8:15 A M Medical Record Number: [id number] Patient Account Number: [id number] Date of Birth/Sex: Treating RN: 08/25/1971 (51 y.o. [name] Primary Care Dez Stauffer: MA Durenda [place], [place] TTHEW Other Clinician: Referring Zayvon Alicea: Treating Reilley Valentine/Extender: Fredirick Maudlin MA Du[place], [place] T[place] Weeks in Treatment: 5 Active Problems Location of Pain Severity and Description of Pain Patient Has Paino No Site Locations Rate the pain. Current Pain Level: 0 Pain Management and Medication Current Pain Management: Electronic Signature(s) Signed: 05/17/2022 6:18:08 PM By: Baruch Gouty RN, BSN Entered By: Baruch Gouty on 05/17/2022 08:39:18 -------------------------------------------------------------------------------- Patient/Caregiver Education Details Patient Name: Date of Service: Robert Brennan, Cyler 8/23/2023andnbsp8:15 A M Medical Record Number: [id number] Patient Account Number: [id number] Date of Birth/Gender: Treating RN: Oct 05, 1970 (51 y.o. [name] Primary Care Physician: MA Durenda G[place], [place] TTHEW Other Clinician: Referring Physician: Treating Physician/Extender: Fredirick Maudlin MA Du[place]da [place], [place] Weeks in Treatment: 5 Education Assessment Education Provided To: Patient Education Topics Provided Venous: Methods: Explain/Verbal Responses: Reinforcements needed, State content correctly Electronic  Signature(s) Signed: 05/17/2022 6:18:08 PM By: Baruch Gouty RN, BSN Entered By: Baruch Gouty on 05/17/2022 08:54:16 -------------------------------------------------------------------------------- Wound Assessment Details Patient Name: Date of Service: Robert Brennan, Robert Brennan 05/17/2022 8:15 A M Medical Record Number: [id number] Patient Account Number: [id number] Date of Birth/Sex: Treating RN: Jul 11, 1971 (51 y.o. [name] Primary Care Lyrick Lagrand: MA Durenda [place], [place] TTHEW Other Clinician: Referring Donique Hammonds: Treating Morganne Haile/Extender: Fredirick Maudlin MA Du[place]da [place], [place] Weeks in Treatment: 5 Wound Status Wound Number: 1 Primary Etiology: Diabetic Wound/Ulcer of the Lower Extremity Wound Location: Right, Lateral Lower Leg Secondary Etiology: Venous Leg Ulcer Wounding Event: Trauma Wound Status: Healed - Epithelialized Date Acquired: 07/24/2021 Comorbid History: Hypertension, Type II Diabetes, Gout, Neuropathy Weeks Of Treatment: 5 Clustered Wound: No Photos Wound Measurements Length: (cm) Width: (cm) Depth: (cm) Area: (cm) Volume: (cm) 0 % Reduction in Area: 100% 0 % Reduction in Volume: 100% 0 Epithelialization: Large (67-100%) 0 Tunneling: No 0 Undermining: No Wound Description Classification: Grade 2 Exudate Amount: None Present Foul Odor After Cleansing: No Slough/Fibrino No Wound Bed Granulation Amount: None Present (0%) Exposed Structure Necrotic Amount: None Present (0%) Fascia Exposed: No Fat Layer (Subcutaneous Tissue) Exposed: No Tendon Exposed: No Muscle Exposed: No Joint Exposed: No Bone Exposed: No Electronic Signature(s) Signed: 05/17/2022 6:18:08 PM By: Baruch Gouty RN, BSN Entered By: Baruch Gouty on 05/17/2022 08:49:26 -------------------------------------------------------------------------------- Wound Assessment Details Patient Name: Date of Service: Robert Brennan, Robert Brennan 05/17/2022 8:15 A M Medical Record Number: [id number] Patient Account  Number: [id number] Date of Birth/Sex: Treating RN: 04/18/1971 (51 y.o. [name] Primary  Care Socrates Cahoon: MA Durenda [place], [place] TTHEW Other Clinician: Referring Arely Tinner: Treating Aneshia Jacquet/Extender: Fredirick Maudlin MA Durenda [place], [place] T[place] Weeks in Treatment: 5 Wound Status Wound Number: 2 Primary Etiology: Diabetic Wound/Ulcer of the Lower Extremity Wound Location: Right, Distal, Lateral Lower Leg Secondary Etiology: Venous Leg Ulcer Wounding Event: Trauma Wound Status: Open Date Acquired: 09/26/2021 Comorbid History: Hypertension, Type II Diabetes, Gout, Neuropathy Weeks Of Treatment: 5 Clustered Wound: No Photos Wound Measurements Length: (cm) 1.6 Width: (cm) 1.6 Depth: (cm) 0.1 Area: (cm) 2.011 Volume: (cm) 0.201 % Reduction in Area: 29.1% % Reduction in Volume: 29.2% Epithelialization: Medium (34-66%) Tunneling: No Undermining: No Wound Description Classification: Grade 1 Wound Margin: Flat and Intact Exudate Amount: Medium Exudate Type: Serosanguineous Exudate Color: red, brown Foul Odor After Cleansing: No Slough/Fibrino Yes Wound Bed Granulation Amount: Large (67-100%) Exposed Structure Granulation Quality: Red Fascia Exposed: No Necrotic Amount: Small (1-33%) Fat Layer (Subcutaneous Tissue) Exposed: Yes Necrotic Quality: Adherent Slough Tendon Exposed: No Muscle Exposed: No Joint Exposed: No Bone Exposed: No Treatment Notes Wound #2 (Lower Leg) Wound Laterality: Right, Lateral, Distal Cleanser Peri-Wound Care Sween Lotion (Moisturizing lotion) Discharge Instruction: Apply moisturizing lotion as directed Topical Primary Dressing Promogran Prisma Matrix, 4.34 (sq in) (silver collagen) Discharge Instruction: Moisten collagen with saline or hydrogel Secondary Dressing Drawtex 4x4 in Discharge Instruction: Apply over primary dressing as directed. Woven Gauze Sponge, Non-Sterile 4x4 in Discharge Instruction: Apply over primary dressing as  directed. Secured With Compression Wrap ThreePress (3 layer compression wrap) Discharge Instruction: Apply three layer compression as directed. Compression Stockings Add-Ons Electronic Signature(s) Signed: 05/17/2022 6:18:08 PM By: Baruch Gouty RN, BSN Entered By: Baruch Gouty on 05/17/2022 08:49:54 -------------------------------------------------------------------------------- [place] Details Patient Name: Date of Service: Robert Brennan, Robert Brennan 05/17/2022 8:15 A M Medical Record Number: [id number] Patient Account Number: [id number] Date of Birth/Sex: Treating RN: 1971/09/23 (51 y.o. [name] Primary Care Jonaven Hilgers: MA Durenda [place], [place] TTHEW Other Clinician: Referring Otilio Groleau: Treating Mikaili Flippin/Extender: Fredirick Maudlin MA [place], [place] T[place] Weeks in Treatment: 5 Vital Signs Time Taken: 08:38 Temperature (F): 98.5 Height (in): 72 Pulse (bpm): 89 Source: Stated Respiratory Rate (breaths/min): 18 Weight (lbs): 280 Blood Pressure (mmHg): 148/96 Source: Stated Capillary Blood Glucose (mg/dl): 130 Body Mass Index (BMI): 38 Reference Range: 80 - 120 mg / dl Notes glucose per pt report this am Electronic Signature(s) Signed: 05/17/2022 6:18:08 PM By: Baruch Gouty RN, BSN Entered By: Baruch Gouty on 05/17/2022 08:52:41

## 2022-05-17 NOTE — Progress Notes (Signed)
Robert Brennan ([id number]) Visit Report for 05/17/2022 Chief Complaint Document Details Patient Name: Date of Service: Robert Brennan, Jeffery 05/17/2022 8:15 A M Medical Record Number: [id number] Patient Account Number: [id number] Date of Birth/Sex: Treating RN: October 26, 1970 (51 y.o. [name] Primary Care Provider: MA Durenda Guthrie, [place] TTHEW Other Clinician: Referring Provider: Treating Provider/Extender: Fredirick Maudlin MA Durenda Guthrie, [place] TTHEW Weeks in Treatment: 5 Information Obtained from: Patient Chief Complaint Patient seen for complaints of Non-Healing Wound. Electronic Signature(s) Signed: 05/17/2022 9:01:25 AM By: Fredirick Maudlin MD FACS Entered By: Fredirick Maudlin on 05/17/2022 09:01:25 -------------------------------------------------------------------------------- Debridement Details Patient Name: Date of Service: Robert Brennan, Robert Brennan 05/17/2022 8:15 A M Medical Record Number: [id number] Patient Account Number: [id number] Date of Birth/Sex: Treating RN: August 06, 1971 (51 y.o. [name] Primary Care Provider: MA Durenda Guthrie, [place] TTHEW Other Clinician: Referring Provider: Treating Provider/Extender: Fredirick Maudlin MA Durenda Guthrie, [place] TTHEW Weeks in Treatment: 5 Debridement Performed for Assessment: Wound #2 Right,Distal,Lateral Lower Leg Performed By: Physician Fredirick Maudlin, MD Debridement Type: Debridement Severity of Tissue Pre Debridement: Fat layer exposed Level of Consciousness (Pre-procedure): Awake and Alert Pre-procedure Verification/Time Out Yes - 08:50 Taken: Start Time: 08:57 Pain Control: Lidocaine 4% T opical Solution T Area Debrided (L x W): otal 1.6 (cm) x 1.6 (cm) = 2.56 (cm) Tissue and other material debrided: Non-Viable, Slough, Biofilm, Slough Level: Non-Viable Tissue Debridement Description: Selective/Open Wound Instrument: Curette Bleeding: Minimum Hemostasis Achieved: Pressure Procedural Pain: 0 Post Procedural Pain: 0 Response to Treatment: Procedure was tolerated  well Level of Consciousness (Post- Awake and Alert procedure): Post Debridement Measurements of Total Wound Length: (cm) 1.6 Width: (cm) 1.6 Depth: (cm) 0.1 Volume: (cm) 0.201 Character of Wound/Ulcer Post Debridement: Improved Severity of Tissue Post Debridement: Fat layer exposed Post Procedure Diagnosis Same as Pre-procedure Electronic Signature(s) Signed: 05/17/2022 10:08:14 AM By: Fredirick Maudlin MD FACS Signed: 05/17/2022 6:18:08 PM By: Baruch Gouty RN, BSN Entered By: Baruch Gouty on 05/17/2022 08:59:43 -------------------------------------------------------------------------------- HPI Details Patient Name: Date of Service: Robert Brennan, Robert Brennan 05/17/2022 8:15 A M Medical Record Number: [id number] Patient Account Number: [id number] Date of Birth/Sex: Treating RN: [birth year] (51 y.o. [name] Primary Care Provider: MA Durenda Guthrie, [place] TTHEW Other Clinician: Referring Provider: Treating Provider/Extender: Fredirick Maudlin MA Durenda Guthrie, [place] TTHEW Weeks in Treatment: 5 History of Present Illness HPI Description: ADMISSION 04/10/2022 This is a 51 year old male with a past medical history significant for poorly controlled type 2 diabetes mellitus (hemoglobin A1c 11.8% in November 2022). He fell and struck his right anterior tibial surface in October or November 2022. He developed a hematoma but subsequently became infected. He underwent debridement on multiple occasions. Ultimately, he was left with a substantial defect that was skin grafted by plastic surgery. The skin graft has taken well but he still has some open tissue around the periphery. He had 2 new areas of breakdown occur secondary to drainage and moisture. Plastic surgery referred him to the wound care center for further evaluation and management. At the site of his skin graft, there has been excellent take, greater than 90%. Around the periphery, there is a thin rim of exposed fat layer with a bit of slough accumulation.  There is granulation tissue forming. Just distal to this, there is a second wound that exposes the fat layer with a fair amount of slough buildup. A similar, albeit smaller wound is located on the posterior leg. No concern for infection. He does have lymphedema pumps and says that he uses them about every other day.  He had venous reflux studies done that were negative. I do not see any evidence of arterial studies. ABIs in clinic today were noncompressible but he has an easily palpable dorsalis pedis and posterior tibialis pulse. 04/17/2022: All of the wounds are little bit smaller today. There is a light layer of slough overlying the surfaces but underneath there is good granulation tissue. The posterior calf wound is nearly closed. 04/24/2022: The posterior wound has closed. Both of the other wounds are smaller today. There is hypertrophic granulation tissue on the more proximal anterior tibial wound and a light layer of slough on the more distal wound. No concern for infection. 05/02/2022: The proximal anterior tibial wound initially appeared to be closed, but there is a small opening underneath the eschar that was present. The more distal wound has contracted and has a light layer of slough with perimeter eschar. No concern for infection. 05/09/2022: The proximal wound is down to just a couple of millimeters. The more distal wound has a band of epithelialized tissue creating 2 separate wounds. The smaller one is very superficial; the larger is a little bit deeper but has good granulation tissue present with just a little bit of slough. 05/17/2022: The proximal wound is closed. The more distal wound is down to just the one larger site. There is granulation tissue and epithelialization present. Light layer of slough and biofilm. Electronic Signature(s) Signed: 05/17/2022 9:02:10 AM By: Fredirick Maudlin MD FACS Entered By: Fredirick Maudlin on 05/17/2022  09:02:10 -------------------------------------------------------------------------------- Physical Exam Details Patient Name: Date of Service: Robert Brennan, Cashel 05/17/2022 8:15 A M Medical Record Number: [id number] Patient Account Number: [id number] Date of Birth/Sex: Treating RN: 1970-10-10 (51 y.o. [name] Primary Care Provider: MA Durenda Guthrie, [place] TTHEW Other Clinician: Referring Provider: Treating Provider/Extender: Fredirick Maudlin MA THIA S, MA TTHEW Weeks in Treatment: 5 Constitutional Slightly hypertensive. . . . No acute distress.Marland Kitchen Respiratory Normal work of breathing on room air.. Notes 05/17/2022: The proximal wound is closed. The more distal wound is down to just the one larger site. There is granulation tissue and epithelialization present. Light layer of slough and biofilm. Electronic Signature(s) Signed: 05/17/2022 9:02:38 AM By: Fredirick Maudlin MD FACS Entered By: Fredirick Maudlin on 05/17/2022 09:02:37 -------------------------------------------------------------------------------- Physician Orders Details Patient Name: Date of Service: Robert Brennan, Densil 05/17/2022 8:15 A M Medical Record Number: [id number] Patient Account Number: [id number] Date of Birth/Sex: Treating RN: 1971-08-31 (51 y.o. [name] Primary Care Provider: MA Durenda Guthrie, [place] TTHEW Other Clinician: Referring Provider: Treating Provider/Extender: Fredirick Maudlin MA Durenda Guthrie, [place] TTHEW Weeks in Treatment: 5 Verbal / Phone Orders: No Diagnosis Coding ICD-10 Coding Code Description [phone] Non-pressure chronic ulcer of 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 exposed I89.0 Lymphedema, not elsewhere classified E11.622 Type 2 diabetes mellitus with other skin ulcer Follow-up Appointments ppointment in 1 week. - Dr. Celine Ahr RM 1 with [facility] Return A Wednesday 05/24/22 @ 08:15am ppointment in 2 weeks. - Friday 06/02/22 @ 08:15 am Return A Anesthetic (In clinic) Topical Lidocaine 4% applied to wound  bed Bathing/ Shower/ Hygiene May shower with protection but do not get wound dressing(s) wet. Edema Control - Lymphedema / SCD / Other Lymphedema Pumps. Use Lymphedema pumps on leg(s) 2-3 times a day for 45-60 minutes. If wearing any wraps or hose, do not remove them. Continue exercising as instructed. - use once per day Elevate legs to the level of the heart or above for 30 minutes daily and/or when sitting, a frequency of: -  throughout the day Avoid standing for long periods of time. Exercise regularly Compression stocking or Garment 20-30 mm/Hg pressure to: - left leg daily Wound Treatment Wound #2 - Lower Leg Wound Laterality: Right, Lateral, Distal Peri-Wound Care: Sween Lotion (Moisturizing lotion) 1 x Per Week/30 Days Discharge Instructions: Apply moisturizing lotion as directed Prim Dressing: Promogran Prisma Matrix, 4.34 (sq in) (silver collagen) 1 x Per Week/30 Days ary Discharge Instructions: Moisten collagen with saline or hydrogel Secondary Dressing: Drawtex 4x4 in 1 x Per Week/30 Days Discharge Instructions: Apply over primary dressing as directed. Secondary Dressing: Woven Gauze Sponge, Non-Sterile 4x4 in 1 x Per Week/30 Days Discharge Instructions: Apply over primary dressing as directed. Compression Wrap: ThreePress (3 layer compression wrap) 1 x Per Week/30 Days Discharge Instructions: Apply three layer compression as directed. Electronic Signature(s) Signed: 05/17/2022 10:08:14 AM By: Duanne Guess MD FACS Entered By: Duanne Guess on 05/17/2022 09:02:55 -------------------------------------------------------------------------------- Problem List Details Patient Name: Date of Service: [name], Fitzgerald 05/17/2022 8:15 A M Medical Record Number: [id number] Patient Account Number: [id number] Date of Birth/Sex: Treating RN: 08-13-1971 (51 y.o. [name] Primary Care Provider: MA Phillis Haggis, [place] TTHEW Other Clinician: Referring Provider: Treating  Provider/Extender: Duanne Guess MA Phillis Haggis, MA TTHEW Weeks in Treatment: 5 Active Problems ICD-10 Encounter Code Description Active Date MDM Diagnosis [phone] Non-pressure chronic ulcer of 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 04/10/2022 No Yes exposed I89.0 Lymphedema, not elsewhere classified 04/10/2022 No Yes E11.622 Type 2 diabetes mellitus with other skin ulcer 04/10/2022 No Yes Inactive Problems Resolved Problems Electronic Signature(s) Signed: 05/17/2022 8:59:53 AM By: Duanne Guess MD FACS Entered By: Duanne Guess on 05/17/2022 08:59:53 -------------------------------------------------------------------------------- Progress Note Details Patient Name: Date of Service: [name], Deloy 05/17/2022 8:15 A M Medical Record Number: [id number] Patient Account Number: [id number] Date of Birth/Sex: Treating RN: [birth year] (51 y.o. [name] Primary Care Provider: MA Phillis Haggis, [place] TTHEW Other Clinician: Referring Provider: Treating Provider/Extender: Duanne Guess MA Phillis Haggis, [place] TTHEW Weeks in Treatment: 5 Subjective Chief Complaint Information obtained from Patient Patient seen for complaints of Non-Healing Wound. History of Present Illness (HPI) ADMISSION 04/10/2022 This is a 51 year old male with a past medical history significant for poorly controlled type 2 diabetes mellitus (hemoglobin A1c 11.8% in November 2022). He fell and struck his right anterior tibial surface in October or November 2022. He developed a hematoma but subsequently became infected. He underwent debridement on multiple occasions. Ultimately, he was left with a substantial defect that was skin grafted by plastic surgery. The skin graft has taken well but he still has some open tissue around the periphery. He had 2 new areas of breakdown occur secondary to drainage and moisture. Plastic surgery referred him to the wound care center for further evaluation and management. At the site of his  skin graft, there has been excellent take, greater than 90%. Around the periphery, there is a thin rim of exposed fat layer with a bit of slough accumulation. There is granulation tissue forming. Just distal to this, there is a second wound that exposes the fat layer with a fair amount of slough buildup. A similar, albeit smaller wound is located on the posterior leg. No concern for infection. He does have lymphedema pumps and says that he uses them about every other day. He had venous reflux studies done that were negative. I do not see any evidence of arterial studies. ABIs in clinic today were noncompressible but he has an easily palpable dorsalis pedis and  posterior tibialis pulse. 04/17/2022: All of the wounds are little bit smaller today. There is a light layer of slough overlying the surfaces but underneath there is good granulation tissue. The posterior calf wound is nearly closed. 04/24/2022: The posterior wound has closed. Both of the other wounds are smaller today. There is hypertrophic granulation tissue on the more proximal anterior tibial wound and a light layer of slough on the more distal wound. No concern for infection. 05/02/2022: The proximal anterior tibial wound initially appeared to be closed, but there is a small opening underneath the eschar that was present. The more distal wound has contracted and has a light layer of slough with perimeter eschar. No concern for infection. 05/09/2022: The proximal wound is down to just a couple of millimeters. The more distal wound has a band of epithelialized tissue creating 2 separate wounds. The smaller one is very superficial; the larger is a little bit deeper but has good granulation tissue present with just a little bit of slough. 05/17/2022: The proximal wound is closed. The more distal wound is down to just the one larger site. There is granulation tissue and epithelialization present. Light layer of slough and biofilm. Patient  History Information obtained from Patient, Chart. Family History Cancer - Father,Paternal Grandparents, Diabetes - Maternal Grandparents,Father, Hypertension - Mother,Father, No family history of Heart Disease, Hereditary Spherocytosis, Kidney Disease, Lung Disease, Seizures, Stroke, Thyroid Problems, Tuberculosis. Social History Never smoker, Marital Status - Married, Alcohol Use - Never, Drug Use - No History, Caffeine Use - Moderate. Medical History Eyes Denies history of Cataracts, Glaucoma, Optic Neuritis Cardiovascular Patient has history of Hypertension Endocrine Patient has history of Type II Diabetes Denies history of Type I Diabetes Integumentary (Skin) Denies history of History of Burn Musculoskeletal Patient has history of Gout Neurologic Patient has history of Neuropathy Oncologic Denies history of Received Chemotherapy, Received Radiation Psychiatric Denies history of Anorexia/bulimia, Confinement Anxiety Hospitalization/Surgery History - debridements of right leg ulcer. - split thickness skin graft right lower leg. Medical A Surgical History Notes nd Cardiovascular hyperlipidemia Objective Constitutional Slightly hypertensive. No acute distress.. Vitals Time Taken: 8:38 AM, Height: 72 in, Source: Stated, Weight: 280 lbs, Source: Stated, BMI: 38, Temperature: 98.5 F, Pulse: 89 bpm, Respiratory Rate: 18 breaths/min, Blood Pressure: 148/96 mmHg, Capillary Blood Glucose: 130 mg/dl. General Notes: glucose per pt report this am Respiratory Normal work of breathing on room air.. General Notes: 05/17/2022: The proximal wound is closed. The more distal wound is down to just the one larger site. There is granulation tissue and epithelialization present. Light layer of slough and biofilm. Integumentary (Hair, Skin) Wound #1 status is Healed - Epithelialized. Original cause of wound was Trauma. The date acquired was: 07/24/2021. The wound has been in treatment 5 weeks.  The wound is located on the Right,Lateral Lower Leg. The wound measures 0cm length x 0cm width x 0cm depth; 0cm^2 area and 0cm^3 volume. There is no tunneling or undermining noted. There is a none present amount of drainage noted. There is no granulation within the wound bed. There is no necrotic tissue within the wound bed. Wound #2 status is Open. Original cause of wound was Trauma. The date acquired was: 09/26/2021. The wound has been in treatment 5 weeks. The wound is located on the Right,Distal,Lateral Lower Leg. The wound measures 1.6cm length x 1.6cm width x 0.1cm depth; 2.011cm^2 area and 0.201cm^3 volume. There is Fat Layer (Subcutaneous Tissue) exposed. There is no tunneling or undermining noted. There is a medium amount  of serosanguineous drainage noted. The wound margin is flat and intact. There is large (67-100%) red granulation within the wound bed. There is a small (1-33%) amount of necrotic tissue within the wound bed including Adherent Slough. Assessment Active Problems ICD-10 Non-pressure chronic ulcer of 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 exposed Lymphedema, not elsewhere classified Type 2 diabetes mellitus with other skin ulcer Procedures Wound #2 Pre-procedure diagnosis of Wound #2 is a Diabetic Wound/Ulcer of the Lower Extremity located on the Right,Distal,Lateral Lower Leg .Severity of Tissue Pre Debridement is: Fat layer exposed. There was a Selective/Open Wound Non-Viable Tissue Debridement with a total area of 2.56 sq cm performed by Duanne Guess, MD. With the following instrument(s): Curette to remove Non-Viable tissue/material. Material removed includes Slough and Biofilm and after achieving pain control using Lidocaine 4% T opical Solution. No specimens were taken. A time out was conducted at 08:50, prior to the start of the procedure. A Minimum amount of bleeding was controlled with Pressure. The procedure was tolerated well with a pain level of 0  throughout and a pain level of 0 following the procedure. Post Debridement Measurements: 1.6cm length x 1.6cm width x 0.1cm depth; 0.201cm^3 volume. Character of Wound/Ulcer Post Debridement is improved. Severity of Tissue Post Debridement is: Fat layer exposed. Post procedure Diagnosis Wound #2: Same as Pre-Procedure Pre-procedure diagnosis of Wound #2 is a Diabetic Wound/Ulcer of the Lower Extremity located on the Right,Distal,Lateral Lower Leg . There was a Three Layer Compression Therapy Procedure by Zenaida Deed, RN. Post procedure Diagnosis Wound #2: Same as Pre-Procedure Plan Follow-up Appointments: Return Appointment in 1 week. - Dr. Lady Gary RM 1 with [facility] Wednesday 05/24/22 @ 08:15am Return Appointment in 2 weeks. - Friday 06/02/22 @ 08:15 am Anesthetic: (In clinic) Topical Lidocaine 4% applied to wound bed Bathing/ Shower/ Hygiene: May shower with protection but do not get wound dressing(s) wet. Edema Control - Lymphedema / SCD / Other: Lymphedema Pumps. Use Lymphedema pumps on leg(s) 2-3 times a day for 45-60 minutes. If wearing any wraps or hose, do not remove them. Continue exercising as instructed. - use once per day Elevate legs to the level of the heart or above for 30 minutes daily and/or when sitting, a frequency of: - throughout the day Avoid standing for long periods of time. Exercise regularly Compression stocking or Garment 20-30 mm/Hg pressure to: - left leg daily WOUND #2: - Lower Leg Wound Laterality: Right, Lateral, Distal Peri-Wound Care: Sween Lotion (Moisturizing lotion) 1 x Per Week/30 Days Discharge Instructions: Apply moisturizing lotion as directed Prim Dressing: Promogran Prisma Matrix, 4.34 (sq in) (silver collagen) 1 x Per Week/30 Days ary Discharge Instructions: Moisten collagen with saline or hydrogel Secondary Dressing: Drawtex 4x4 in 1 x Per Week/30 Days Discharge Instructions: Apply over primary dressing as directed. Secondary Dressing: Woven  Gauze Sponge, Non-Sterile 4x4 in 1 x Per Week/30 Days Discharge Instructions: Apply over primary dressing as directed. Com pression Wrap: ThreePress (3 layer compression wrap) 1 x Per Week/30 Days Discharge Instructions: Apply three layer compression as directed. 05/17/2022: The proximal wound is closed. The more distal wound is down to just the one larger site. There is granulation tissue and epithelialization present. Light layer of slough and biofilm. I used a curette to debride slough and biofilm from the wound. We will continue to use Prisma silver collagen with 3 layer compression. Follow-up in 1 week. Electronic Signature(s) Signed: 05/17/2022 9:03:17 AM By: Duanne Guess MD FACS Entered By: Duanne Guess on 05/17/2022  09:03:17 -------------------------------------------------------------------------------- HxROS Details Patient Name: Date of Service: Robert Brennan, Waymon 05/17/2022 8:15 A M Medical Record Number: [id number] Patient Account Number: [id number] Date of Birth/Sex: Treating RN: 04/29/1971 (51 y.o. [name] Primary Care Provider: MA Durenda Guthrie, [place] TTHEW Other Clinician: Referring Provider: Treating Provider/Extender: Fredirick Maudlin MA Durenda Guthrie, [place] TTHEW Weeks in Treatment: 5 Information Obtained From Patient Chart Eyes Medical History: Negative for: Cataracts; Glaucoma; Optic Neuritis Cardiovascular Medical History: Positive for: Hypertension Past Medical History Notes: hyperlipidemia Endocrine Medical History: Positive for: Type II Diabetes Negative for: Type I Diabetes Time with diabetes: 21 yrs Treated with: Insulin, Oral agents Blood sugar tested every day: No Integumentary (Skin) Medical History: Negative for: History of Burn Musculoskeletal Medical History: Positive for: Gout Neurologic Medical History: Positive for: Neuropathy Oncologic Medical History: Negative for: Received Chemotherapy; Received Radiation Psychiatric Medical  History: Negative for: Anorexia/bulimia; Confinement Anxiety Immunizations Pneumococcal Vaccine: Received Pneumococcal Vaccination: Yes Received Pneumococcal Vaccination On or After 60th Birthday: No Implantable Devices No devices added Hospitalization / Surgery History Type of Hospitalization/Surgery debridements of right leg ulcer split thickness skin graft right lower leg Family and Social History Cancer: Yes - Father,Paternal Grandparents; Diabetes: Yes - Maternal Grandparents,Father; Heart Disease: No; Hereditary Spherocytosis: No; Hypertension: Yes - Mother,Father; Kidney Disease: No; Lung Disease: No; Seizures: No; Stroke: No; Thyroid Problems: No; Tuberculosis: No; Never smoker; Marital Status - Married; Alcohol Use: Never; Drug Use: No History; Caffeine Use: Moderate; Financial Concerns: No; Food, Clothing or Shelter Needs: No; Support System Lacking: No; Transportation Concerns: No Electronic Signature(s) Signed: 05/17/2022 10:08:14 AM By: Fredirick Maudlin MD FACS Signed: 05/17/2022 6:18:08 PM By: Baruch Gouty RN, BSN Entered By: Fredirick Maudlin on 05/17/2022 09:02:15 -------------------------------------------------------------------------------- SuperBill Details Patient Name: Date of Service: Robert Brennan, Dariel 05/17/2022 Medical Record Number: [id number] Patient Account Number: [id number] Date of Birth/Sex: Treating RN: 07-15-1971 (51 y.o. [name] Primary Care Provider: MA Durenda Guthrie, [place] TTHEW Other Clinician: Referring Provider: Treating Provider/Extender: Fredirick Maudlin MA Durenda Guthrie, [place] TTHEW Weeks in Treatment: 5 Diagnosis Coding ICD-10 Codes Code Description [phone] Non-pressure chronic ulcer of 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 exposed I89.0 Lymphedema, not elsewhere classified E11.622 Type 2 diabetes mellitus with other skin ulcer Facility Procedures CPT4 Code: [id number] Description: [phone] - DEBRIDE WOUND 1ST 20 SQ CM OR < ICD-10 Diagnosis  Description 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 of 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 expos Modifier: ed Quantity: 1 Physician Procedures : CPT4 Code Description Modifier [id number] - WC PHYS LEVEL 3 - EST PT 25 ICD-10 Diagnosis Description 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 of 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 exposed E11.622 Type 2 diabetes mellitus with other skin ulcer  I89.0 Lymphedema, not elsewhere classified Quantity: 1 : [id number] - WC PHYS DEBR WO ANESTH 20 SQ CM ICD-10 Diagnosis Description [id number] Non-pressure chronic ulcer of 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 exposed Quantity: 1 Electronic Signature(s) Signed: 05/17/2022 9:03:35 AM By: Fredirick Maudlin MD FACS Entered By: Fredirick Maudlin on 05/17/2022 09:03:35

## 2022-05-24 ENCOUNTER — Encounter (HOSPITAL_BASED_OUTPATIENT_CLINIC_OR_DEPARTMENT_OTHER): Payer: BC Managed Care – PPO | Admitting: General Surgery

## 2022-05-24 DIAGNOSIS — E11621 Type 2 diabetes mellitus with foot ulcer: Secondary | ICD-10-CM | POA: Diagnosis not present

## 2022-05-24 NOTE — Progress Notes (Addendum)
Robert Brennan, Robert Brennan ([id number]) Visit Report for 05/24/2022 Chief Complaint Document Details Patient Name: Date of Service: Robert PrimerBA NKS, Robert Brennan 05/24/2022 8:15 A M Medical Record Number: [id number] Patient Account Number: [id number] Date of Birth/Sex: Treating RN: 11/26/1970 (51 y.o. M) Primary Care Provider: MA Phillis HaggisHIA S, MA TTHEW Other Clinician: Referring Provider: Treating Provider/Extender: Duanne Guessannon, Akilah Cureton MA Phillis HaggisHIA S, MA TTHEW Weeks in Treatment: 6 Information Obtained from: Patient Chief Complaint Patient seen for complaints of Non-Healing Wound. Electronic Signature(s) Signed: 05/24/2022 8:58:02 AM By: Duanne Guessannon, Aveya Beal MD FACS Entered By: Duanne Guessannon, Sayre Witherington on 05/24/2022 08:58:02 -------------------------------------------------------------------------------- HPI Details Patient Name: Date of Service: Robert PrimerBA NKS, Robert Brennan 05/24/2022 8:15 A M Medical Record Number: [id number] Patient Account Number: [id number] Date of Birth/Sex: Treating RN: 04/02/1971 (51 y.o. M) Primary Care Provider: MA Phillis HaggisHIA S, MA TTHEW Other Clinician: Referring Provider: Treating Provider/Extender: Duanne Guessannon, Champ Keetch MA Phillis HaggisHIA S, MA TTHEW Weeks in Treatment: 6 History of Present Illness HPI Description: ADMISSION 04/10/2022 This is a 51 year old male with a past medical history significant for poorly controlled type 2 diabetes mellitus (hemoglobin A1c 11.8% in November 2022). He fell and struck his right anterior tibial surface in October or November 2022. He developed a hematoma but subsequently became infected. He underwent debridement on multiple occasions. Ultimately, he was left with a substantial defect that was skin grafted by plastic surgery. The skin graft has taken well but he still has some open tissue around the periphery. He had 2 new areas of breakdown occur secondary to drainage and moisture. Plastic surgery referred him to the wound care center for further evaluation and management. At the site of his skin graft, there has been  excellent take, greater than 90%. Around the periphery, there is a thin rim of exposed fat layer with a bit of slough accumulation. There is granulation tissue forming. Just distal to this, there is a second wound that exposes the fat layer with a fair amount of slough buildup. A similar, albeit smaller wound is located on the posterior leg. No concern for infection. He does have lymphedema pumps and says that he uses them about every other day. He had venous reflux studies done that were negative. I do not see any evidence of arterial studies. ABIs in clinic today were noncompressible but he has an easily palpable dorsalis pedis and posterior tibialis pulse. 04/17/2022: All of the wounds are little bit smaller today. There is a light layer of slough overlying the surfaces but underneath there is good granulation tissue. The posterior calf wound is nearly closed. 04/24/2022: The posterior wound has closed. Both of the other wounds are smaller today. There is hypertrophic granulation tissue on the more proximal anterior tibial wound and a light layer of slough on the more distal wound. No concern for infection. 05/02/2022: The proximal anterior tibial wound initially appeared to be closed, but there is a small opening underneath the eschar that was present. The more distal wound has contracted and has a light layer of slough with perimeter eschar. No concern for infection. 05/09/2022: The proximal wound is down to just a couple of millimeters. The more distal wound has a band of epithelialized tissue creating 2 separate wounds. The smaller one is very superficial; the larger is a little bit deeper but has good granulation tissue present with just a little bit of slough. 05/17/2022: The proximal wound is closed. The more distal wound is down to just the one larger site. There is granulation tissue and epithelialization present. Light layer of slough and biofilm. 05/24/2022: The  remaining wound is continuing to  contract. It is clean with good granulation tissue. Electronic Signature(s) Signed: 05/24/2022 8:58:31 AM By: Duanne Guess MD FACS Entered By: Duanne Guess on 05/24/2022 08:58:30 -------------------------------------------------------------------------------- Physical Exam Details Patient Name: Date of Service: Robert Brennan, Robert Brennan 05/24/2022 8:15 A M Medical Record Number: [id number] Patient Account Number: [id number] Date of Birth/Sex: Treating RN: [birth year] (51 y.o. M) Primary Care Provider: MA Phillis Haggis, MA TTHEW Other Clinician: Referring Provider: Treating Provider/Extender: Duanne Guess MA THIA S, MA TTHEW Weeks in Treatment: 6 Constitutional . . . . No acute distress.Marland Kitchen Respiratory Normal work of breathing on room air.. Notes 05/24/2022: The remaining wound is continuing to contract. It is clean with good granulation tissue. Electronic Signature(s) Signed: 05/24/2022 8:58:56 AM By: Duanne Guess MD FACS Entered By: Duanne Guess on 05/24/2022 08:58:56 -------------------------------------------------------------------------------- Physician Orders Details Patient Name: Date of Service: Robert Brennan, Robert Brennan 05/24/2022 8:15 A M Medical Record Number: [id number] Patient Account Number: [id number] Date of Birth/Sex: Treating RN: 11-10-1970 (51 y.o. Cline Cools Primary Care Provider: MA Phillis Haggis, [place] TTHEW Other Clinician: Referring Provider: Treating Provider/Extender: Duanne Guess MA Phillis Haggis, MA TTHEW Weeks in Treatment: 6 Verbal / Phone Orders: No Diagnosis Coding ICD-10 Coding Code Description [phone] Non-pressure chronic ulcer of 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 exposed I89.0 Lymphedema, not elsewhere classified E11.622 Type 2 diabetes mellitus with other skin ulcer Follow-up Appointments ppointment in 1 week. - Dr. Lady Gary RM 1 with [facility] Return A Friday 06/02/22 @ 08:15 am Anesthetic (In clinic) Topical Lidocaine 4% applied to wound bed Bathing/ Shower/  Hygiene May shower with protection but do not get wound dressing(s) wet. Edema Control - Lymphedema / SCD / Other Lymphedema Pumps. Use Lymphedema pumps on leg(s) 2-3 times a day for 45-60 minutes. If wearing any wraps or hose, do not remove them. Continue exercising as instructed. - use once per day Elevate legs to the level of the heart or above for 30 minutes daily and/or when sitting, a frequency of: - throughout the day Avoid standing for long periods of time. Exercise regularly Compression stocking or Garment 20-30 mm/Hg pressure to: - left leg daily Wound Treatment Wound #2 - Lower Leg Wound Laterality: Right, Lateral, Distal Peri-Wound Care: Sween Lotion (Moisturizing lotion) 1 x Per Week/30 Days Discharge Instructions: Apply moisturizing lotion as directed Prim Dressing: Promogran Prisma Matrix, 4.34 (sq in) (silver collagen) 1 x Per Week/30 Days ary Discharge Instructions: Moisten collagen with saline or hydrogel Secondary Dressing: Drawtex 4x4 in 1 x Per Week/30 Days Discharge Instructions: Apply over primary dressing as directed. Secondary Dressing: Woven Gauze Sponge, Non-Sterile 4x4 in 1 x Per Week/30 Days Discharge Instructions: Apply over primary dressing as directed. Compression Wrap: ThreePress (3 layer compression wrap) 1 x Per Week/30 Days Discharge Instructions: Apply three layer compression as directed. Electronic Signature(s) Signed: 05/24/2022 10:05:10 AM By: Duanne Guess MD FACS Entered By: Duanne Guess on 05/24/2022 09:00:58 -------------------------------------------------------------------------------- Problem List Details Patient Name: Date of Service: Robert Brennan, Robert Brennan 05/24/2022 8:15 A M Medical Record Number: [id number] Patient Account Number: [id number] Date of Birth/Sex: Treating RN: [birth year] (51 y.o. M) Primary Care Provider: MA Phillis Haggis, MA TTHEW Other Clinician: Referring Provider: Treating Provider/Extender: Duanne Guess MA Phillis Haggis, MA  TTHEW Weeks in Treatment: 6 Active Problems ICD-10 Encounter Code Description Active Date MDM Diagnosis 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 of 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 04/10/2022 No Yes exposed I89.0 Lymphedema, not elsewhere classified 04/10/2022 No Yes E11.622 Type 2  diabetes mellitus with other skin ulcer 04/10/2022 No Yes Inactive Problems Resolved Problems Electronic Signature(s) Signed: 05/24/2022 8:55:29 AM By: Duanne Guess MD FACS Entered By: Duanne Guess on 05/24/2022 08:55:29 -------------------------------------------------------------------------------- Progress Note Details Patient Name: Date of Service: Robert Brennan, Robert Brennan 05/24/2022 8:15 A M Medical Record Number: [id number] Patient Account Number: [id number] Date of Birth/Sex: Treating RN: 1971-06-22 (51 y.o. M) Primary Care Provider: MA Phillis Haggis, MA TTHEW Other Clinician: Referring Provider: Treating Provider/Extender: Duanne Guess MA Phillis Haggis, MA TTHEW Weeks in Treatment: 6 Subjective Chief Complaint Information obtained from Patient Patient seen for complaints of Non-Healing Wound. History of Present Illness (HPI) ADMISSION 04/10/2022 This is a 51 year old male with a past medical history significant for poorly controlled type 2 diabetes mellitus (hemoglobin A1c 11.8% in November 2022). He fell and struck his right anterior tibial surface in October or November 2022. He developed a hematoma but subsequently became infected. He underwent debridement on multiple occasions. Ultimately, he was left with a substantial defect that was skin grafted by plastic surgery. The skin graft has taken well but he still has some open tissue around the periphery. He had 2 new areas of breakdown occur secondary to drainage and moisture. Plastic surgery referred him to the wound care center for further evaluation and management. At the site of his skin graft, there has been excellent take, greater than 90%. Around  the periphery, there is a thin rim of exposed fat layer with a bit of slough accumulation. There is granulation tissue forming. Just distal to this, there is a second wound that exposes the fat layer with a fair amount of slough buildup. A similar, albeit smaller wound is located on the posterior leg. No concern for infection. He does have lymphedema pumps and says that he uses them about every other day. He had venous reflux studies done that were negative. I do not see any evidence of arterial studies. ABIs in clinic today were noncompressible but he has an easily palpable dorsalis pedis and posterior tibialis pulse. 04/17/2022: All of the wounds are little bit smaller today. There is a light layer of slough overlying the surfaces but underneath there is good granulation tissue. The posterior calf wound is nearly closed. 04/24/2022: The posterior wound has closed. Both of the other wounds are smaller today. There is hypertrophic granulation tissue on the more proximal anterior tibial wound and a light layer of slough on the more distal wound. No concern for infection. 05/02/2022: The proximal anterior tibial wound initially appeared to be closed, but there is a small opening underneath the eschar that was present. The more distal wound has contracted and has a light layer of slough with perimeter eschar. No concern for infection. 05/09/2022: The proximal wound is down to just a couple of millimeters. The more distal wound has a band of epithelialized tissue creating 2 separate wounds. The smaller one is very superficial; the larger is a little bit deeper but has good granulation tissue present with just a little bit of slough. 05/17/2022: The proximal wound is closed. The more distal wound is down to just the one larger site. There is granulation tissue and epithelialization present. Light layer of slough and biofilm. 05/24/2022: The remaining wound is continuing to contract. It is clean with good  granulation tissue. Patient History Information obtained from Patient, Chart. Family History Cancer - Father,Paternal Grandparents, Diabetes - Maternal Grandparents,Father, Hypertension - Mother,Father, No family history of Heart Disease, Hereditary Spherocytosis, Kidney Disease, Lung Disease, Seizures, Stroke, Thyroid Problems, Tuberculosis.  Social History Never smoker, Marital Status - Married, Alcohol Use - Never, Drug Use - No History, Caffeine Use - Moderate. Medical History Eyes Denies history of Cataracts, Glaucoma, Optic Neuritis Cardiovascular Patient has history of Hypertension Endocrine Patient has history of Type II Diabetes Denies history of Type I Diabetes Integumentary (Skin) Denies history of History of Burn Musculoskeletal Patient has history of Gout Neurologic Patient has history of Neuropathy Oncologic Denies history of Received Chemotherapy, Received Radiation Psychiatric Denies history of Anorexia/bulimia, Confinement Anxiety Hospitalization/Surgery History - debridements of right leg ulcer. - split thickness skin graft right lower leg. Medical A Surgical History Notes nd Cardiovascular hyperlipidemia Objective Constitutional No acute distress.. Vitals Time Taken: 8:46 AM, Height: 72 in, Weight: 280 lbs, BMI: 38, Temperature: 99.0 F, Pulse: 91 bpm, Respiratory Rate: 18 breaths/min, Blood Pressure: 137/88 mmHg, Capillary Blood Glucose: 120 mg/dl. Respiratory Normal work of breathing on room air.. General Notes: 05/24/2022: The remaining wound is continuing to contract. It is clean with good granulation tissue. Integumentary (Hair, Skin) Wound #2 status is Open. Original cause of wound was Trauma. The date acquired was: 09/26/2021. The wound has been in treatment 6 weeks. The wound is located on the Right,Distal,Lateral Lower Leg. The wound measures 0.9cm length x 0.9cm width x 0.1cm depth; 0.636cm^2 area and 0.064cm^3 volume. There is Fat Layer  (Subcutaneous Tissue) exposed. There is no tunneling or undermining noted. There is a medium amount of serosanguineous drainage noted. The wound margin is flat and intact. There is large (67-100%) red granulation within the wound bed. There is a small (1-33%) amount of necrotic tissue within the wound bed including Adherent Slough. Assessment Active Problems ICD-10 Non-pressure chronic ulcer of 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 exposed Lymphedema, not elsewhere classified Type 2 diabetes mellitus with other skin ulcer Procedures Wound #2 Pre-procedure diagnosis of Wound #2 is a Diabetic Wound/Ulcer of the Lower Extremity located on the Right,Distal,Lateral Lower Leg . There was a Three Layer Compression Therapy Procedure by Redmond Pulling, RN. Post procedure Diagnosis Wound #2: Same as Pre-Procedure Plan Follow-up Appointments: Return Appointment in 1 week. - Dr. Lady Gary RM 1 with [facility] An Friday 06/02/22 @ 08:15 am Anesthetic: (In clinic) Topical Lidocaine 4% applied to wound bed Bathing/ Shower/ Hygiene: May shower with protection but do not get wound dressing(s) wet. Edema Control - Lymphedema / SCD / Other: Lymphedema Pumps. Use Lymphedema pumps on leg(s) 2-3 times a day for 45-60 minutes. If wearing any wraps or hose, do not remove them. Continue exercising as instructed. - use once per day Elevate legs to the level of the heart or above for 30 minutes daily and/or when sitting, a frequency of: - throughout the day Avoid standing for long periods of time. Exercise regularly Compression stocking or Garment 20-30 mm/Hg pressure to: - left leg daily WOUND #2: - Lower Leg Wound Laterality: Right, Lateral, Distal Peri-Wound Care: Sween Lotion (Moisturizing lotion) 1 x Per Week/30 Days Discharge Instructions: Apply moisturizing lotion as directed Prim Dressing: Promogran Prisma Matrix, 4.34 (sq in) (silver collagen) 1 x Per Week/30 Days ary Discharge Instructions: Moisten  collagen with saline or hydrogel Secondary Dressing: Drawtex 4x4 in 1 x Per Week/30 Days Discharge Instructions: Apply over primary dressing as directed. Secondary Dressing: Woven Gauze Sponge, Non-Sterile 4x4 in 1 x Per Week/30 Days Discharge Instructions: Apply over primary dressing as directed. Com pression Wrap: ThreePress (3 layer compression wrap) 1 x Per Week/30 Days Discharge Instructions: Apply three layer compression as directed. 05/24/2022: The remaining  wound is continuing to contract. It is clean with good granulation tissue. No debridement was necessary today. We will continue to use Prisma silver collagen with 3 layer compression. Follow-up in 1 week. Electronic Signature(s) Signed: 05/24/2022 9:01:16 AM By: Duanne Guess MD FACS Entered By: Duanne Guess on 05/24/2022 09:01:16 -------------------------------------------------------------------------------- HxROS Details Patient Name: Date of Service: Robert Brennan, Robert Brennan 05/24/2022 8:15 A M Medical Record Number: [id number] Patient Account Number: [id number] Date of Birth/Sex: Treating RN: 1971-07-30 (51 y.o. M) Primary Care Provider: MA Phillis Haggis, MA TTHEW Other Clinician: Referring Provider: Treating Provider/Extender: Duanne Guess MA Phillis Haggis, MA TTHEW Weeks in Treatment: 6 Information Obtained From Patient Chart Eyes Medical History: Negative for: Cataracts; Glaucoma; Optic Neuritis Cardiovascular Medical History: Positive for: Hypertension Past Medical History Notes: hyperlipidemia Endocrine Medical History: Positive for: Type II Diabetes Negative for: Type I Diabetes Time with diabetes: 21 yrs Treated with: Insulin, Oral agents Blood sugar tested every day: No Integumentary (Skin) Medical History: Negative for: History of Burn Musculoskeletal Medical History: Positive for: Gout Neurologic Medical History: Positive for: Neuropathy Oncologic Medical History: Negative for: Received Chemotherapy; Received  Radiation Psychiatric Medical History: Negative for: Anorexia/bulimia; Confinement Anxiety Immunizations Pneumococcal Vaccine: Received Pneumococcal Vaccination: Yes Received Pneumococcal Vaccination On or After 60th Birthday: No Implantable Devices No devices added Hospitalization / Surgery History Type of Hospitalization/Surgery debridements of right leg ulcer split thickness skin graft right lower leg Family and Social History Cancer: Yes - Father,Paternal Grandparents; Diabetes: Yes - Maternal Grandparents,Father; Heart Disease: No; Hereditary Spherocytosis: No; Hypertension: Yes - Mother,Father; Kidney Disease: No; Lung Disease: No; Seizures: No; Stroke: No; Thyroid Problems: No; Tuberculosis: No; Never smoker; Marital Status - Married; Alcohol Use: Never; Drug Use: No History; Caffeine Use: Moderate; Financial Concerns: No; Food, Clothing or Shelter Needs: No; Support System Lacking: No; Transportation Concerns: No Electronic Signature(s) Signed: 05/24/2022 10:05:10 AM By: Duanne Guess MD FACS Entered By: Duanne Guess on 05/24/2022 08:58:35 -------------------------------------------------------------------------------- SuperBill Details Patient Name: Date of Service: Robert Brennan, Robert Brennan 05/24/2022 Medical Record Number: [id number] Patient Account Number: [id number] Date of Birth/Sex: Treating RN: May 14, 1971 (51 y.o. M) Primary Care Provider: MA Phillis Haggis, MA TTHEW Other Clinician: Referring Provider: Treating Provider/Extender: Duanne Guess MA Phillis Haggis, MA TTHEW Weeks in Treatment: 6 Diagnosis Coding ICD-10 Codes Code Description [id number] Non-pressure chronic ulcer of 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 exposed I89.0 Lymphedema, not elsewhere classified E11.622 Type 2 diabetes mellitus with other skin ulcer Facility Procedures CPT4 Code: 62952841 Description: 99213 - WOUND CARE VISIT-LEV 3 EST PT Modifier: 25 Quantity: 1 CPT4 Code: 32440102 Description:  (Facility Use Only) [id number] - APPLY MULTLAY COMPRS LWR RT LEG Modifier: 59 Quantity: 1 Physician Procedures : CPT4 Code Description Modifier [id number] 99213 - WC PHYS LEVEL 3 - EST PT ICD-10 Diagnosis Description 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 of 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 exposed E11.622 Type 2 diabetes mellitus with other skin ulcer I89.0  Lymphedema, not elsewhere classified Quantity: 1 Electronic Signature(s) Signed: 05/24/2022 10:05:10 AM By: Duanne Guess MD FACS Signed: 05/24/2022 4:37:26 PM By: Redmond Pulling RN, BSN Previous Signature: 05/24/2022 9:01:28 AM Version By: Duanne Guess MD FACS Previous Signature: 05/24/2022 9:01:28 AM Version By: Duanne Guess MD FACS Entered By: Redmond Pulling on 05/24/2022 09:13:56

## 2022-05-24 NOTE — Progress Notes (Signed)
Robert, Bernasconi Brennan ([id number]) Visit Report for 05/24/2022 Arrival Information Details Patient Name: Date of Service: Robert Brennan, Nayden 05/24/2022 8:15 A M Medical Record Number: [id number] Patient Account Number: [id number] Date of Birth/Sex: Treating RN: 06-13-1971 (51 y.o. Robert Brennan Primary Care Celise Bazar: MA Durenda G[place], [place] TTHEW Other Clinician: Referring Jaydin Boniface: Treating Korry Dalgleish/Extender: Fredirick Maudlin MA Durenda [place], [place] TTHEW Weeks in Treatment: 6 Visit Information History Since Last Visit Added or deleted any medications: No Patient Arrived: Ambulatory Any new allergies or adverse reactions: No Arrival Time: 08:45 Had a fall or experienced change in No Accompanied By: self activities of daily living that may affect Transfer Assistance: None risk of falls: Patient Identification Verified: Yes Signs or symptoms of abuse/neglect since last visito No Secondary Verification Process Completed: Yes Hospitalized since last visit: No Patient Requires Transmission-Based Precautions: No Implantable device outside of the clinic excluding No Patient Has Alerts: No cellular tissue based products placed in the center since last visit: Has Dressing in Place as Prescribed: Yes Has Compression in Place as Prescribed: Yes Pain Present Now: No Electronic Signature(s) Signed: 05/24/2022 4:37:26 PM By: Sharyn Creamer RN, BSN Entered By: Sharyn Creamer on 05/24/2022 08:46:48 -------------------------------------------------------------------------------- Clinic Level of Care Assessment Details Patient Name: Date of Service: [facility], [place] 05/24/2022 8:15 A M Medical Record Number: [id number] Patient Account Number: [id number] Date of Birth/Sex: Treating RN: April 17, 1971 (51 y.o. Robert Brennan Primary Care Robert Brennan: MA Durenda G[place], [place] TTHEW Other Clinician: Referring Drayton Tieu: Treating Robert Brennan/Extender: Fredirick Maudlin MA Durenda [place], [place] TTHEW Weeks in Treatment: 6 Clinic Level of Care Assessment Items TOOL  4 Quantity Score X- 1 0 Use when only an EandM is performed on FOLLOW-UP visit ASSESSMENTS - Nursing Assessment / Reassessment X- 1 10 Reassessment of Co-morbidities (includes updates in patient status) X- 1 5 Reassessment of Adherence to Treatment Plan ASSESSMENTS - Wound and Skin A ssessment / Reassessment X - Simple Wound Assessment / Reassessment - one wound 1 5 _0  - 0 Complex Wound Assessment / Reassessment - multiple wounds _1  - 0 Dermatologic / Skin Assessment (not related to wound area) ASSESSMENTS - Focused Assessment X- 1 5 Circumferential Edema Measurements - multi extremities _2  - 0 Nutritional Assessment / Counseling / Intervention _3  - 0 Lower Extremity Assessment (monofilament, tuning fork, pulses) _4  - 0 Peripheral Arterial Disease Assessment (using hand held doppler) ASSESSMENTS - Ostomy and/or Continence Assessment and Care _5  - 0 Incontinence Assessment and Management _6  - 0 Ostomy Care Assessment and Management (repouching, etc.) PROCESS - Coordination of Care X - Simple Patient / Family Education for ongoing care 1 15 _7  - 0 Complex (extensive) Patient / Family Education for ongoing care X- 1 10 Staff obtains Programmer, systems, Records, T Results / Process Orders est _8  - 0 Staff telephones HHA, Nursing Homes / Clarify orders / etc _9  - 0 Routine Transfer to another Facility (non-emergent condition) _10  - 0 Routine Hospital Admission (non-emergent condition) _11  - 0 New Admissions / Biomedical engineer / Ordering NPWT Apligraf, etc. , _12  - 0 Emergency Hospital Admission (emergent condition) _13  - 0 Simple Discharge Coordination _14  - 0 Complex (extensive) Discharge Coordination PROCESS - Special Needs _15  - 0 Pediatric / Minor Patient Management _16  - 0 Isolation Patient Management _17  - 0 Hearing / Language / Visual special needs _18  - 0 Assessment of Community assistance (transportation, D/C planning, etc.) _19  - 0 Additional assistance / Altered  mentation _20  - 0 Support Surface(s) Assessment (bed, cushion, seat, etc.) INTERVENTIONS - Wound Cleansing / Measurement X -  Simple Wound Cleansing - one wound 1 5 _0  - 0 Complex Wound Cleansing - multiple wounds X- 1 5 Wound Imaging (photographs - any number of wounds) _1  - 0 Wound Tracing (instead of photographs) X- 1 5 Simple Wound Measurement - one wound _2  - 0 Complex Wound Measurement - multiple wounds INTERVENTIONS - Wound Dressings _3  - 0 Small Wound Dressing one or multiple wounds X- 1 15 Medium Wound Dressing one or multiple wounds _4  - 0 Large Wound Dressing one or multiple wounds <TFTDDUKGURKYHCWC>_3<\/JSEGBTDVVOHYWVPX>_1  - 0 Application of Medications - topical <GGYIRSWNIOEVOJJK>_0<\/XFGHWEXHBZJIRCVE>_9  - 0 Application of Medications - injection INTERVENTIONS - Miscellaneous _7  - 0 External ear exam _8  - 0 Specimen Collection (cultures, biopsies, blood, body fluids, etc.) _9  - 0 Specimen(s) / Culture(s) sent or taken to Lab for analysis _10  - 0 Patient Transfer (multiple staff / Civil Service fast streamer / Similar devices) _11  - 0 Simple Staple / Suture removal (25 or less) _12  - 0 Complex Staple / Suture removal (26 or more) _13  - 0 Hypo / Hyperglycemic Management (close monitor of Blood Glucose) _14  - 0 Ankle / Brachial Index (ABI) - do not check if billed separately X- 1 5 Vital Signs Has the patient been seen at the hospital within the last three years: Yes Total Score: 85 Level Of Care: New/Established - Level 3 Electronic Signature(s) Signed: 05/24/2022 4:37:26 PM By: Sharyn Creamer RN, BSN Entered By: Sharyn Creamer on 05/24/2022 09:13:31 -------------------------------------------------------------------------------- Compression Therapy Details Patient Name: Date of Service: Robert Brennan, Jonathen 05/24/2022 8:15 A M Medical Record Number: [id number] Patient Account Number: [id number] Date of Birth/Sex: Treating RN: August 25, 1971 (51 y.o. Robert Brennan Primary Care Asianna Brundage: MA Durenda [place], [place] TTHEW Other Clinician: Referring Javaria Knapke: Treating  Shalena Ezzell/Extender: Fredirick Maudlin MA Durenda [place], [place] TTHEW Weeks in Treatment: 6 Compression Therapy Performed for Wound Assessment: Wound #2 Right,Distal,Lateral Lower Leg Performed By: Clinician Sharyn Creamer, RN Compression Type: Three Layer Post Procedure Diagnosis Same as Pre-procedure Electronic Signature(s) Signed: 05/24/2022 4:37:26 PM By: Sharyn Creamer RN, BSN Entered By: Sharyn Creamer on 05/24/2022 08:55:06 -------------------------------------------------------------------------------- Encounter Discharge Information Details Patient Name: Date of Service: Robert Brennan, Darcey 05/24/2022 8:15 A M Medical Record Number: [id number] Patient Account Number: [id number] Date of Birth/Sex: Treating RN: 02/18/1971 (51 y.o. Robert Brennan Primary Care Ameriah Lint: MA Durenda [place], [place] TTHEW Other Clinician: Referring Shaterica Mcclatchy: Treating Raiya Stainback/Extender: Fredirick Maudlin MA Durenda [place], [place] TTHEW Weeks in Treatment: 6 Encounter Discharge Information Items Discharge Condition: Stable Ambulatory Status: Ambulatory Discharge Destination: Home Transportation: Private Auto Accompanied By: self Schedule Follow-up Appointment: Yes Clinical Summary of Care: Patient Declined Electronic Signature(s) Signed: 05/24/2022 4:37:26 PM By: Sharyn Creamer RN, BSN Entered By: Sharyn Creamer on 05/24/2022 09:12:16 -------------------------------------------------------------------------------- Lower Extremity Assessment Details Patient Name: Date of Service: Robert Brennan, Aundra 05/24/2022 8:15 A M Medical Record Number: [id number] Patient Account Number: [id number] Date of Birth/Sex: Treating RN: Aug 25, 1971 (51 y.o. Robert Brennan Primary Care Treasure Ochs: MA Durenda G[place], [place] TTHEW Other Clinician: Referring Aisling Emigh: Treating Dominiqua Cooner/Extender: Fredirick Maudlin MA Durenda [place], [place] TTHEW Weeks in Treatment: 6 Edema Assessment Assessed: [Left: No] [Right: No] Edema: [Left: Ye] [Right: s] Calf Left: Right: Point of Measurement: From  Medial Instep 39 cm Ankle Left: Right: Point of Measurement: From Medial Instep 24 cm Vascular Assessment Pulses: Dorsalis Pedis Palpable: [Right:Yes] Electronic Signature(s) Signed: 05/24/2022 4:37:26 PM By: Sharyn Creamer RN, BSN Entered By: Sharyn Creamer on 05/24/2022 08:44:39 -------------------------------------------------------------------------------- Multi Wound Chart Details Patient Name: Date of Service: Robert Brennan, Thedore 05/24/2022 8:15 A M Medical Record  Number: [id number] Patient Account Number: [id number] Date of Birth/Sex: Treating RN: 02-26-1971 (51 y.o. 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] [N/A:N/A] Secondary Etiology: [2:Hypertension, Type II Diabetes, Gout, N/A] Comorbid History: [2:Neuropathy 09/26/2021] [N/A:N/A] Date Acquired: [2:6] [N/A:N/A] Weeks of Treatment: [2:Open] [N/A:N/A] Wound Status: [2:No] [N/A:N/A] Wound Recurrence: [2:0.9x0.9x0.1] [N/A:N/A] Measurements L x W x D (cm) [2:0.636] [N/A:N/A] A (cm) : rea [2:0.064] [N/A:N/A] Volume (cm) : [2:77.60%] [N/A:N/A] % Reduction in A rea: [2:77.50%] [N/A:N/A] % Reduction in Volume: [2:Grade 1] [N/A:N/A] Classification: [2:Medium] [N/A:N/A] Exudate A mount: [2:Serosanguineous] [N/A:N/A] Exudate Type: [2:red, brown] [N/A:N/A] Exudate Color: [2:Flat and Intact] [N/A:N/A] Wound Margin: [2:Large (67-100%)] [N/A:N/A] Granulation A mount: [2:Red]  [N/A:N/A] Granulation Quality: [2:Small (1-33%)] [N/A:N/A] Necrotic A mount: [2:Fat Layer (Subcutaneous Tissue): Yes N/A] Exposed Structures: [2:Fascia: No Tendon: No Muscle: No Joint: No Bone: No Large (67-100%)] [N/A:N/A] Epithelialization: [2:Compression Therapy] [N/A:N/A] Treatment Notes Electronic Signature(s) Signed: 05/24/2022 8:57:57 AM By: Fredirick Maudlin MD FACS Entered By: Fredirick Maudlin on 05/24/2022 08:57:57 -------------------------------------------------------------------------------- Multi-Disciplinary Care Plan Details Patient Name: Date of Service: Robert Brennan, Raynald 05/24/2022 8:15 A M Medical Record Number: [id number] Patient Account Number: [id number] Date of Birth/Sex: Treating RN: [birth year] (51 y.o. Robert Brennan Primary Care Meklit Cotta: MA Durenda G[place], [place] TTHEW Other Clinician: Referring Diamonds Lippard: Treating Alannah Averhart/Extender: Fredirick Maudlin MA Durenda [place], [place] TTHEW Weeks in Treatment: 6 Multidisciplinary Care Plan reviewed with physician Active Inactive Nutrition Nursing Diagnoses: Impaired glucose control: actual or potential Potential for alteratiion in Nutrition/Potential for imbalanced nutrition Goals: Patient/caregiver will maintain therapeutic glucose control Date Initiated: 04/11/2022 Target Resolution Date: 06/23/2022 Goal Status: Active Interventions: Assess HgA1c results as ordered upon admission and as needed Assess patient nutrition upon admission and as needed per policy Treatment Activities: Dietary management education, guidance and counseling : 04/10/2022 Giving encouragement to exercise : 04/10/2022 Patient referred to Primary Care Physician for further nutritional evaluation : 04/10/2022 Notes: Venous Leg Ulcer Nursing Diagnoses: Knowledge deficit related to disease process and management Potential for venous Insuffiency (use before diagnosis confirmed) Goals: Patient will maintain optimal edema control Date Initiated: 04/11/2022 Target  Resolution Date: 06/23/2022 Goal Status: Active Interventions: Assess peripheral edema status every visit. Compression as ordered Provide education on venous insufficiency Treatment Activities: Therapeutic compression applied : 04/10/2022 Notes: Wound/Skin Impairment Nursing Diagnoses: Impaired tissue integrity Knowledge deficit related to ulceration/compromised skin integrity Goals: Patient/caregiver will verbalize understanding of skin care regimen Date Initiated: 04/11/2022 Date Inactivated: 05/02/2022 Target Resolution Date: 05/09/2022 Goal Status: Met Ulcer/skin breakdown will have a volume reduction of 30% by week 4 Date Initiated: 04/11/2022 Date Inactivated: 05/17/2022 Target Resolution Date: 05/09/2022 Goal Status: Met Ulcer/skin breakdown will have a volume reduction of 50% by week 8 Date Initiated: 05/17/2022 Target Resolution Date: 06/06/2022 Goal Status: Active Interventions: Assess patient/caregiver ability to obtain necessary supplies Assess patient/caregiver ability to perform ulcer/skin care regimen upon admission and as needed Assess ulceration(s) every visit Provide education on ulcer and skin care Treatment Activities: Skin care regimen initiated : 04/10/2022 Topical wound management initiated : 04/10/2022 Notes: Electronic Signature(s) Signed: 05/24/2022 4:37:26 PM By: Sharyn Creamer RN, BSN Entered By: Sharyn Creamer on 05/24/2022  08:50:52 -------------------------------------------------------------------------------- Pain Assessment Details Patient Name: Date of Service: Robert Brennan, Hjalmer 05/24/2022 8:15 A M Medical Record Number: [id number] Patient Account Number: [id number] Date of Birth/Sex: Treating RN: 29-Apr-1971 (51 y.o. Robert Brennan Primary Care Rock Sobol: MA Durenda G[place], [place] TTHEW Other Clinician: Referring Halei Hanover: Treating Maimuna Leaman/Extender: Fredirick Maudlin MA Durenda [place], [place] TTHEW Weeks in Treatment: 6 Active Problems Location of Pain Severity and  Description of Pain Patient Has Paino No Site Locations Pain Management and Medication Current Pain Management: Electronic Signature(s) Signed: 05/24/2022 4:37:26 PM By: Sharyn Creamer RN, BSN Entered By: Sharyn Creamer on 05/24/2022 08:47:22 -------------------------------------------------------------------------------- Patient/Caregiver Education Details Patient Name: Date of Service: Robert Brennan, Mete 8/30/2023andnbsp8:15 A M Medical Record Number: [id number] Patient Account Number: [id number] Date of Birth/Gender: Treating RN: [birth year] (51 y.o. Robert Brennan Primary Care Physician: MA Durenda G[place], [place] TTHEW Other Clinician: Referring Physician: Treating Physician/Extender: Fredirick Maudlin MA Durenda [place], [place] TTHE[place] Weeks in Treatment: 6 Education Assessment Education Provided To: Patient Education Topics Provided Wound/Skin Impairment: Methods: Explain/Verbal Responses: State content correctly Electronic Signature(s) Signed: 05/24/2022 4:37:26 PM By: Sharyn Creamer RN, BSN Entered By: Sharyn Creamer on 05/24/2022 08:51:05 -------------------------------------------------------------------------------- Wound Assessment Details Patient Name: Date of Service: Robert Brennan, Larico 05/24/2022 8:15 A M Medical Record Number: [id number] Patient Account Number: [id number] Date of Birth/Sex: Treating RN: [birth year] (51 y.o. Robert Brennan Primary Care Philomina Leon: MA Durenda G[place], [place] TTHEW Other Clinician: Referring Tremaine Earwood: Treating Arrayah Connors/Extender: Fredirick Maudlin MA Durenda [place], [place] TTHEW Weeks in Treatment: 6 Wound Status Wound Number: 2 Primary Etiology: Diabetic Wound/Ulcer of the Lower Extremity Wound Location: Right, Distal, Lateral Lower Leg Secondary Etiology: Venous Leg Ulcer Wounding Event: Trauma Wound Status: Open Date Acquired: 09/26/2021 Comorbid History: Hypertension, Type II Diabetes, Gout, Neuropathy Weeks Of Treatment: 6 Clustered Wound: No Photos Wound Measurements Length:  (cm) 0.9 Width: (cm) 0.9 Depth: (cm) 0.1 Area: (cm) 0.636 Volume: (cm) 0.064 % Reduction in Area: 77.6% % Reduction in Volume: 77.5% Epithelialization: Large (67-100%) Tunneling: No Undermining: No Wound Description Classification: Grade 1 Wound Margin: Flat and Intact Exudate Amount: Medium Exudate Type: Serosanguineous Exudate Color: red, brown Foul Odor After Cleansing: No Slough/Fibrino Yes Wound Bed Granulation Amount: Large (67-100%) Exposed Structure Granulation Quality: Red Fascia Exposed: No Necrotic Amount: Small (1-33%) Fat Layer (Subcutaneous Tissue) Exposed: Yes Necrotic Quality: Adherent Slough Tendon Exposed: No Muscle Exposed: No Joint Exposed: No Bone Exposed: No Treatment Notes Wound #2 (Lower Leg) Wound Laterality: Right, Lateral, Distal Cleanser Peri-Wound Care Sween Lotion (Moisturizing lotion) Discharge Instruction: Apply moisturizing lotion as directed Topical Primary Dressing Promogran Prisma Matrix, 4.34 (sq in) (silver collagen) Discharge Instruction: Moisten collagen with saline or hydrogel Secondary Dressing Drawtex 4x4 in Discharge Instruction: Apply over primary dressing as directed. Woven Gauze Sponge, Non-Sterile 4x4 in Discharge Instruction: Apply over primary dressing as directed. Secured With Compression Wrap ThreePress (3 layer compression wrap) Discharge Instruction: Apply three layer compression as directed. Compression Stockings Add-Ons Electronic Signature(s) Signed: 05/24/2022 4:37:26 PM By: Sharyn Creamer RN, BSN Entered By: Sharyn Creamer on 05/24/2022 08:52:00 -------------------------------------------------------------------------------- [place] Details Patient Name: Date of Service: Robert Brennan, Bertel 05/24/2022 8:15 A M Medical Record Number: [id number] Patient Account Number: [id number] Date of Birth/Sex: Treating RN: Apr 03, 1971 (51 y.o. Robert Brennan Primary Care Amer Alcindor: MA Durenda G[place], [place] TTHEW Other  Clinician: Referring Quanta Roher: Treating Chesney Klimaszewski/Extender: Fredirick Maudlin MA Durenda [place], [place] TTHEW Weeks in Treatment: 6 Vital Signs Time Taken: 08:46 Temperature (F): 99.0 Height (in): 72 Pulse (bpm): 91 Weight (lbs): 280 Respiratory Rate (breaths/min): 18 Body  Mass Index (BMI): 38 Blood Pressure (mmHg): 137/88 Capillary Blood Glucose (mg/dl): 120 Reference Range: 80 - 120 mg / dl Electronic Signature(s) Signed: 05/24/2022 4:37:26 PM By: Sharyn Creamer RN, BSN Entered By: Sharyn Creamer on 05/24/2022 08:47:16

## 2022-06-02 ENCOUNTER — Encounter (HOSPITAL_BASED_OUTPATIENT_CLINIC_OR_DEPARTMENT_OTHER): Payer: BC Managed Care – PPO | Attending: General Surgery | Admitting: General Surgery

## 2022-06-02 DIAGNOSIS — E11622 Type 2 diabetes mellitus with other skin ulcer: Secondary | ICD-10-CM | POA: Insufficient documentation

## 2022-06-02 DIAGNOSIS — E1140 Type 2 diabetes mellitus with diabetic neuropathy, unspecified: Secondary | ICD-10-CM | POA: Insufficient documentation

## 2022-06-02 DIAGNOSIS — I890 Lymphedema, not elsewhere classified: Secondary | ICD-10-CM | POA: Insufficient documentation

## 2022-06-02 DIAGNOSIS — L97812 Non-pressure chronic ulcer of 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 exposed: Secondary | ICD-10-CM | POA: Diagnosis not present

## 2022-06-02 DIAGNOSIS — E785 Hyperlipidemia, unspecified: Secondary | ICD-10-CM | POA: Insufficient documentation

## 2022-06-02 DIAGNOSIS — I10 Essential (primary) hypertension: Secondary | ICD-10-CM | POA: Diagnosis not present

## 2022-06-02 DIAGNOSIS — E1151 Type 2 diabetes mellitus with diabetic peripheral angiopathy without gangrene: Secondary | ICD-10-CM | POA: Diagnosis not present

## 2022-06-02 NOTE — Progress Notes (Signed)
FECHER, Gryphon ([id number]) Visit Report for 06/02/2022 Arrival Information Details Patient Name: Date of Service: Robert Brennan, Robert Brennan 06/02/2022 8:15 A M Medical Record Number: [id number] Patient Account Number: [id number] Date of Birth/Sex: Treating RN: 05/10/1971 (51 y.o. [name] Primary Care Urbano Milhouse: MA Durenda [place], [place] TTHEW Other Clinician: Referring Jamille Fisher: Treating Hisashi Amadon/Extender: Fredirick Maudlin MA Durenda [place], [place] TTHEW Weeks in Treatment: 7 Visit Information History Since Last Visit Added or deleted any medications: No Patient Arrived: Ambulatory Any new allergies or adverse reactions: No Arrival Time: 08:36 Had a fall or experienced change in No Accompanied By: self activities of daily living that may affect Transfer Assistance: None risk of falls: Patient Requires Transmission-Based Precautions: No Signs or symptoms of abuse/neglect since last visito No Patient Has Alerts: No Hospitalized since last visit: No Implantable device outside of the clinic excluding No cellular tissue based products placed in the center since last visit: Has Dressing in Place as Prescribed: Yes Has Compression in Place as Prescribed: Yes Pain Present Now: No Electronic Signature(s) Signed: 06/02/2022 6:08:36 PM By: Baruch Gouty RN, BSN Entered By: Baruch Gouty on 06/02/2022 08:43:03 -------------------------------------------------------------------------------- Encounter Discharge Information Details Patient Name: Date of Service: Robert Brennan, Robert Brennan 06/02/2022 8:15 A M Medical Record Number: [id number] Patient Account Number: [id number] Date of Birth/Sex: Treating RN: [birth year] (51 y.o. [name] Primary Care Shalom Mcguiness: MA Durenda [place], [place] TTHEW Other Clinician: Referring Bliss Tsang: Treating Oluwaseun Cremer/Extender: Fredirick Maudlin MA Durenda [place], [place] TTHE[place] Weeks in Treatment: 7 Encounter Discharge Information Items Post Procedure Vitals Discharge Condition: Stable Temperature (F): 98.7 Ambulatory  Status: Ambulatory Pulse (bpm): 88 Discharge Destination: Home Respiratory Rate (breaths/min): 18 Transportation: Private Auto Blood Pressure (mmHg): 134/84 Accompanied By: self Schedule Follow-up Appointment: Yes Clinical Summary of Care: Patient Declined Electronic Signature(s) Signed: 06/02/2022 6:08:36 PM By: Baruch Gouty RN, BSN Entered By: Baruch Gouty on 06/02/2022 09:18:32 -------------------------------------------------------------------------------- Lower Extremity Assessment Details Patient Name: Date of Service: Robert Brennan, Robert Brennan 06/02/2022 8:15 A M Medical Record Number: [id number] Patient Account Number: [id number] Date of Birth/Sex: Treating RN: 11/16/1970 (51 y.o. [name] Primary Care Jacklyne Baik: MA Durenda [place], [place] TTHEW Other Clinician: Referring Evangelene Vora: Treating Vallorie Niccoli/Extender: Fredirick Maudlin MA Durenda [place], [place] TTHEW Weeks in Treatment: 7 Edema Assessment Assessed: [Left: No] [Right: No] Edema: [Left: Ye] [Right: s] Calf Left: Right: Point of Measurement: From Medial Instep 40 cm Ankle Left: Right: Point of Measurement: From Medial Instep 24 cm Vascular Assessment Pulses: Dorsalis Pedis Palpable: [Right:Yes] Electronic Signature(s) Signed: 06/02/2022 6:08:36 PM By: Baruch Gouty RN, BSN Entered By: Baruch Gouty on 06/02/2022 08:51:20 -------------------------------------------------------------------------------- Multi Wound Chart Details Patient Name: Date of Service: Robert Brennan, Robert Brennan 06/02/2022 8:15 A M Medical Record Number: [id number] Patient Account Number: [id number] Date of Birth/Sex: Treating RN: 05-23-1971 (51 y.o. M) Primary Care Davin Muramoto: MA Durenda G[place], [place] TTHEW Other Clinician: Referring Bindu Docter: Treating Westyn Driggers/Extender: Fredirick Maudlin MA THIA Chauncey Cruel, [place] TTHEW Weeks in Treatment: 7 Vital Signs Height(in): 72 Capillary Blood Glucose(mg/dl): 130 Weight(lbs): 280 Pulse(bpm): 21 Body Mass Index(BMI): 38 Blood Pressure(mmHg):  134/84 Temperature(F): 98.7 Respiratory Rate(breaths/min): 18 Photos: [N/A:N/A] Right, Distal, Lateral Lower Leg Right, Anterior Lower Leg N/A Wound Location: Trauma Trauma N/A Wounding Event: Diabetic Wound/Ulcer of the Lower Diabetic Wound/Ulcer of the Lower N/A Primary Etiology: Extremity Extremity Venous Leg Ulcer N/A N/A Secondary Etiology: Hypertension, Type II Diabetes, Gout, Hypertension, Type II Diabetes, Gout, N/A Comorbid History: Neuropathy Neuropathy 09/26/2021 05/31/2022 N/A Date Acquired: 7 0 N/A Weeks of Treatment: Open Open N/A Wound Status: No No N/A Wound Recurrence:  0.1x0.1x0.1 0.8x0.6x0.1 N/A Measurements L x W x D (cm) 0.008 0.377 N/A A (cm) : rea 0.001 0.038 N/A Volume (cm) : 99.70% 0.00% N/A % Reduction in A rea: 99.60% 0.00% N/A % Reduction in Volume: Grade 1 Grade 1 N/A Classification: Small Medium N/A Exudate A mount: Serosanguineous Serosanguineous N/A Exudate Type: red, brown red, brown N/A Exudate Color: Flat and Intact Flat and Intact N/A Wound Margin: Small (1-33%) Medium (34-66%) N/A Granulation A mount: Red Pink N/A Granulation Quality: None Present (0%) Medium (34-66%) N/A Necrotic A mount: Fat Layer (Subcutaneous Tissue): Yes Fat Layer (Subcutaneous Tissue): Yes N/A Exposed Structures: Fascia: No Fascia: No Tendon: No Tendon: No Muscle: No Muscle: No Joint: No Joint: No Bone: No Bone: No Large (67-100%) Small (1-33%) N/A Epithelialization: Debridement - Selective/Open Wound Debridement - Selective/Open Wound N/A Debridement: Pre-procedure Verification/Time Out 09:00 09:00 N/A Taken: Lidocaine 4% Topical Solution Lidocaine 4% Topical Solution N/A Pain Control: Necrotic/Eschar N/A N/A Tissue Debrided: Non-Viable Tissue Non-Viable Tissue N/A Level: 0.04 0.48 N/A Debridement A (sq cm): rea Curette Curette N/A Instrument: Minimum Minimum N/A Bleeding: Pressure Pressure N/A Hemostasis A chieved: 0 0  N/A Procedural Pain: 0 0 N/A Post Procedural Pain: Procedure was tolerated well Procedure was tolerated well N/A Debridement Treatment Response: 0.2x0.2x0.1 0.8x0.6x0.1 N/A Post Debridement Measurements L x W x D (cm) 0.003 0.038 N/A Post Debridement Volume: (cm) Debridement Debridement N/A Procedures Performed: Treatment Notes Electronic Signature(s) Signed: 06/02/2022 9:03:35 AM By: Fredirick Maudlin MD FACS Entered By: Fredirick Maudlin on 06/02/2022 09:03:35 -------------------------------------------------------------------------------- Multi-Disciplinary Care Plan Details Patient Name: Date of Service: Robert Brennan, Robert Brennan 06/02/2022 8:15 A M Medical Record Number: [id number] Patient Account Number: [id number] Date of Birth/Sex: Treating RN: 1971/06/14 (51 y.o. [name] Primary Care Minola Guin: MA Durenda [place], [place] TTHEW Other Clinician: Referring Franki Stemen: Treating Falon Huesca/Extender: Fredirick Maudlin MA Durenda [place], [place] TTHE[place] Weeks in Treatment: 7 Multidisciplinary Care Plan reviewed with physician Active Inactive Nutrition Nursing Diagnoses: Impaired glucose control: actual or potential Potential for alteratiion in Nutrition/Potential for imbalanced nutrition Goals: Patient/caregiver will maintain therapeutic glucose control Date Initiated: 04/11/2022 Target Resolution Date: 06/23/2022 Goal Status: Active Interventions: Assess HgA1c results as ordered upon admission and as needed Assess patient nutrition upon admission and as needed per policy Treatment Activities: Dietary management education, guidance and counseling : 04/10/2022 Giving encouragement to exercise : 04/10/2022 Patient referred to Primary Care Physician for further nutritional evaluation : 04/10/2022 Notes: Venous Leg Ulcer Nursing Diagnoses: Knowledge deficit related to disease process and management Potential for venous Insuffiency (use before diagnosis confirmed) Goals: Patient will maintain optimal edema  control Date Initiated: 04/11/2022 Target Resolution Date: 06/23/2022 Goal Status: Active Interventions: Assess peripheral edema status every visit. Compression as ordered Provide education on venous insufficiency Treatment Activities: Therapeutic compression applied : 04/10/2022 Notes: Wound/Skin Impairment Nursing Diagnoses: Impaired tissue integrity Knowledge deficit related to ulceration/compromised skin integrity Goals: Patient/caregiver will verbalize understanding of skin care regimen Date Initiated: 04/11/2022 Date Inactivated: 05/02/2022 Target Resolution Date: 05/09/2022 Goal Status: Met Ulcer/skin breakdown will have a volume reduction of 30% by week 4 Date Initiated: 04/11/2022 Date Inactivated: 05/17/2022 Target Resolution Date: 05/09/2022 Goal Status: Met Ulcer/skin breakdown will have a volume reduction of 50% by week 8 Date Initiated: 05/17/2022 Target Resolution Date: 06/06/2022 Goal Status: Active Interventions: Assess patient/caregiver ability to obtain necessary supplies Assess patient/caregiver ability to perform ulcer/skin care regimen upon admission and as needed Assess ulceration(s) every visit Provide education on ulcer and skin care Treatment Activities: Skin care regimen initiated : 04/10/2022 Topical  wound management initiated : 04/10/2022 Notes: Electronic Signature(s) Signed: 06/02/2022 6:08:36 PM By: Baruch Gouty RN, BSN Entered By: Baruch Gouty on 06/02/2022 08:58:26 -------------------------------------------------------------------------------- Pain Assessment Details Patient Name: Date of Service: Robert Brennan, Robert Brennan 06/02/2022 8:15 A M Medical Record Number: [id number] Patient Account Number: [id number] Date of Birth/Sex: Treating RN: 09-Oct-1970 (51 y.o. [name] Primary Care Tanveer Dobberstein: MA Durenda [place], [place] TTHEW Other Clinician: Referring Maylon Sailors: Treating Abb Gobert/Extender: Fredirick Maudlin MA Durenda [place], [place] TTHE[place] Weeks in Treatment: 7 Active  Problems Location of Pain Severity and Description of Pain Patient Has Paino No Site Locations Rate the pain. Current Pain Level: 0 Pain Management and Medication Current Pain Management: Electronic Signature(s) Signed: 06/02/2022 6:08:36 PM By: Baruch Gouty RN, BSN Entered By: Baruch Gouty on 06/02/2022 08:43:50 -------------------------------------------------------------------------------- Patient/Caregiver Education Details Patient Name: Date of Service: Robert Brennan, Robert Brennan 9/8/2023andnbsp8:15 A M Medical Record Number: [id number] Patient Account Number: [id number] Date of Birth/Gender: Treating RN: 1971-01-12 (51 y.o. [name] Primary Care Physician: MA Durenda G[place], [place] TTHEW Other Clinician: Referring Physician: Treating Physician/Extender: Fredirick Maudlin MA Durenda [place], [place] TTHE[place] Weeks in Treatment: 7 Education Assessment Education Provided To: Patient Education Topics Provided Venous: Methods: Explain/Verbal Responses: Reinforcements needed, State content correctly Wound/Skin Impairment: Methods: Explain/Verbal Responses: Reinforcements needed, State content correctly Electronic Signature(s) Signed: 06/02/2022 6:08:36 PM By: Baruch Gouty RN, BSN Entered By: Baruch Gouty on 06/02/2022 08:58:44 -------------------------------------------------------------------------------- Wound Assessment Details Patient Name: Date of Service: Robert Brennan, Robert Brennan 06/02/2022 8:15 A M Medical Record Number: [id number] Patient Account Number: [id number] Date of Birth/Sex: Treating RN: 17-May-1971 (51 y.o. [name] Primary Care Monty Spicher: MA Durenda [place], [place] TTHEW Other Clinician: Referring Shamar Engelmann: Treating Kimberleigh Mehan/Extender: Fredirick Maudlin MA Durenda [place], [place] TTHEW Weeks in Treatment: 7 Wound Status Wound Number: 2 Primary Etiology: Diabetic Wound/Ulcer of the Lower Extremity Wound Location: Right, Distal, Lateral Lower Leg Secondary Etiology: Venous Leg Ulcer Wounding Event:  Trauma Wound Status: Open Date Acquired: 09/26/2021 Comorbid History: Hypertension, Type II Diabetes, Gout, Neuropathy Weeks Of Treatment: 7 Clustered Wound: No Photos Wound Measurements Length: (cm) 0.1 Width: (cm) 0.1 Depth: (cm) 0.1 Area: (cm) 0.008 Volume: (cm) 0.001 % Reduction in Area: 99.7% % Reduction in Volume: 99.6% Epithelialization: Large (67-100%) Tunneling: No Undermining: No Wound Description Classification: Grade 1 Wound Margin: Flat and Intact Exudate Amount: Small Exudate Type: Serosanguineous Exudate Color: red, brown Foul Odor After Cleansing: No Slough/Fibrino No Wound Bed Granulation Amount: Small (1-33%) Exposed Structure Granulation Quality: Red Fascia Exposed: No Necrotic Amount: None Present (0%) Fat Layer (Subcutaneous Tissue) Exposed: Yes Tendon Exposed: No Muscle Exposed: No Joint Exposed: No Bone Exposed: No Treatment Notes Wound #2 (Lower Leg) Wound Laterality: Right, Lateral, Distal Cleanser Peri-Wound Care Sween Lotion (Moisturizing lotion) Discharge Instruction: Apply moisturizing lotion as directed Topical Primary Dressing Promogran Prisma Matrix, 4.34 (sq in) (silver collagen) Discharge Instruction: Moisten collagen with saline or hydrogel Secondary Dressing Drawtex 4x4 in Discharge Instruction: Apply over primary dressing as directed. Woven Gauze Sponge, Non-Sterile 4x4 in Discharge Instruction: Apply over primary dressing as directed. Secured With Compression Wrap ThreePress (3 layer compression wrap) Discharge Instruction: Apply three layer compression as directed. Compression Stockings Add-Ons Electronic Signature(s) Signed: 06/02/2022 6:08:36 PM By: Baruch Gouty RN, BSN Entered By: Baruch Gouty on 06/02/2022 08:56:18 -------------------------------------------------------------------------------- Wound Assessment Details Patient Name: Date of Service: Robert Brennan, Robert Brennan 06/02/2022 8:15 A M Medical Record Number:  [id number] Patient Account Number: [id number] Date of Birth/Sex: Treating RN: 28-Apr-1971 (51 y.o. [name] Primary Care Robert Brennan: [place] Durenda [place], [place] TTHEW  Other Clinician: Referring Arrionna Serena: Treating Jurnei Latini/Extender: Fredirick Maudlin MA Durenda [place], [place] TTHE[place] Weeks in Treatment: 7 Wound Status Wound Number: 4 Primary Etiology: Diabetic Wound/Ulcer of the Lower Extremity Wound Location: Right, Anterior Lower Leg Wound Status: Open Wounding Event: Trauma Comorbid History: Hypertension, Type II Diabetes, Gout, Neuropathy Date Acquired: 05/31/2022 Weeks Of Treatment: 0 Clustered Wound: No Photos Wound Measurements Length: (cm) 0.8 Width: (cm) 0.6 Depth: (cm) 0.1 Area: (cm) 0.377 Volume: (cm) 0.038 % Reduction in Area: 0% % Reduction in Volume: 0% Epithelialization: Small (1-33%) Tunneling: No Undermining: No Wound Description Classification: Grade 1 Wound Margin: Flat and Intact Exudate Amount: Medium Exudate Type: Serosanguineous Exudate Color: red, brown Foul Odor After Cleansing: No Slough/Fibrino Yes Wound Bed Granulation Amount: Medium (34-66%) Exposed Structure Granulation Quality: Pink Fascia Exposed: No Necrotic Amount: Medium (34-66%) Fat Layer (Subcutaneous Tissue) Exposed: Yes Necrotic Quality: Adherent Slough Tendon Exposed: No Muscle Exposed: No Joint Exposed: No Bone Exposed: No Treatment Notes Wound #4 (Lower Leg) Wound Laterality: Right, Anterior Cleanser Peri-Wound Care Sween Lotion (Moisturizing lotion) Discharge Instruction: Apply moisturizing lotion as directed Topical Primary Dressing Promogran Prisma Matrix, 4.34 (sq in) (silver collagen) Discharge Instruction: Moisten collagen with saline or hydrogel Secondary Dressing Drawtex 4x4 in Discharge Instruction: Apply over primary dressing as directed. Woven Gauze Sponge, Non-Sterile 4x4 in Discharge Instruction: Apply over primary dressing as directed. Secured With Compression  Wrap ThreePress (3 layer compression wrap) Discharge Instruction: Apply three layer compression as directed. Compression Stockings Add-Ons Electronic Signature(s) Signed: 06/02/2022 6:08:36 PM By: Baruch Gouty RN, BSN Entered By: Baruch Gouty on 06/02/2022 08:56:48 -------------------------------------------------------------------------------- Vitals Details Patient Name: Date of Service: Robert Brennan, Robert Brennan 06/02/2022 8:15 A M Medical Record Number: [id number] Patient Account Number: [id number] Date of Birth/Sex: Treating RN: 11/08/1970 (51 y.o. [name] Primary Care Aritha Huckeba: MA Durenda [place], [place] TTHEW Other Clinician: Referring Kristan Brummitt: Treating Leala Bryand/Extender: Fredirick Maudlin MA Durenda [place], [place] TTHEW Weeks in Treatment: 7 Vital Signs Time Taken: 08:43 Temperature (F): 98.7 Height (in): 72 Pulse (bpm): 88 Weight (lbs): 280 Respiratory Rate (breaths/min): 18 Body Mass Index (BMI): 38 Blood Pressure (mmHg): 134/84 Capillary Blood Glucose (mg/dl): 130 Reference Range: 80 - 120 mg / dl Notes glucose per pt report this am Electronic Signature(s) Signed: 06/02/2022 6:08:36 PM By: Baruch Gouty RN, BSN Entered By: Baruch Gouty on 06/02/2022 08:43:43

## 2022-06-02 NOTE — Progress Notes (Signed)
Robert Brennan, Robert Brennan ([id number]) Visit Report for 06/02/2022 Chief Complaint Document Details Patient Name: Date of Service: Robert Brennan, [place] 06/02/2022 8:15 A M Medical Record Number: [id number] Patient Account Number: [id number] Date of Birth/Sex: Treating RN: 1970/11/08 (51 y.o. M) Primary Care Provider: MA Durenda Guthrie, MA TTHEW Other Clinician: Referring Provider: Treating Provider/Extender: Fredirick Maudlin MA Durenda Guthrie, MA TTHEW Weeks in Treatment: 7 Information Obtained from: Patient Chief Complaint Patient seen for complaints of Non-Healing Wound. Electronic Signature(s) Signed: 06/02/2022 9:03:49 AM By: Fredirick Maudlin MD FACS Entered By: Fredirick Maudlin on 06/02/2022 09:03:48 -------------------------------------------------------------------------------- Debridement Details Patient Name: Date of Service: Robert Brennan, Robert Brennan 06/02/2022 8:15 A M Medical Record Number: [id number] Patient Account Number: [id number] Date of Birth/Sex: Treating RN: 1971-09-04 (51 y.o. [name] Primary Care Provider: MA Durenda Guthrie, [place] TTHEW Other Clinician: Referring Provider: Treating Provider/Extender: Fredirick Maudlin MA Durenda Guthrie, MA TTHEW Weeks in Treatment: 7 Debridement Performed for Assessment: Wound #2 Right,Distal,Lateral Lower Leg Performed By: Physician Fredirick Maudlin, MD Debridement Type: Debridement Severity of Tissue Pre Debridement: Fat layer exposed Level of Consciousness (Pre-procedure): Awake and Alert Pre-procedure Verification/Time Out Yes - 09:00 Taken: Start Time: 09:00 Pain Control: Lidocaine 4% T opical Solution T Area Debrided (L x W): otal 0.2 (cm) x 0.2 (cm) = 0.04 (cm) Tissue and other material debrided: Non-Viable, Eschar Level: Non-Viable Tissue Debridement Description: Selective/Open Wound Instrument: Curette Bleeding: Minimum Hemostasis Achieved: Pressure Procedural Pain: 0 Post Procedural Pain: 0 Response to Treatment: Procedure was tolerated well Level of Consciousness (Post-  Awake and Alert procedure): Post Debridement Measurements of Total Wound Length: (cm) 0.2 Width: (cm) 0.2 Depth: (cm) 0.1 Volume: (cm) 0.003 Character of Wound/Ulcer Post Debridement: Improved Severity of Tissue Post Debridement: Fat layer exposed Post Procedure Diagnosis Same as Pre-procedure Notes scribed by Baruch Gouty, RN for Dr. Celine Ahr Electronic Signature(s) Signed: 06/02/2022 9:11:13 AM By: Fredirick Maudlin MD FACS Signed: 06/02/2022 6:08:36 PM By: Baruch Gouty RN, BSN Entered By: Baruch Gouty on 06/02/2022 [id number] -------------------------------------------------------------------------------- Debridement Details Patient Name: Date of Service: Robert Brennan, Robert Brennan 06/02/2022 8:15 A M Medical Record Number: [id number] Patient Account Number: [id number] Date of Birth/Sex: Treating RN: 1971-06-23 (51 y.o. [name] Primary Care Provider: MA Durenda Guthrie, [place] TTHEW Other Clinician: Referring Provider: Treating Provider/Extender: Fredirick Maudlin MA Durenda Guthrie, MA TTHEW Weeks in Treatment: 7 Debridement Performed for Assessment: Wound #4 Right,Anterior Lower Leg Performed By: Physician Fredirick Maudlin, MD Debridement Type: Debridement Severity of Tissue Pre Debridement: Fat layer exposed Level of Consciousness (Pre-procedure): Awake and Alert Pre-procedure Verification/Time Out Yes - 09:00 Taken: Start Time: 09:00 Pain Control: Lidocaine 4% T opical Solution T Area Debrided (L x W): otal 0.8 (cm) x 0.6 (cm) = 0.48 (cm) Tissue and other material debrided: Non-Viable, Biofilm Level: Non-Viable Tissue Debridement Description: Selective/Open Wound Instrument: Curette Bleeding: Minimum Hemostasis Achieved: Pressure Procedural Pain: 0 Post Procedural Pain: 0 Response to Treatment: Procedure was tolerated well Level of Consciousness (Post- Awake and Alert procedure): Post Debridement Measurements of Total Wound Length: (cm) 0.8 Width: (cm) 0.6 Depth: (cm) 0.1 Volume:  (cm) 0.038 Character of Wound/Ulcer Post Debridement: Improved Severity of Tissue Post Debridement: Fat layer exposed Post Procedure Diagnosis Same as Pre-procedure Notes scribed by Baruch Gouty, RN for Dr. Celine Ahr Electronic Signature(s) Signed: 06/02/2022 9:11:13 AM By: Fredirick Maudlin MD FACS Signed: 06/02/2022 6:08:36 PM By: Baruch Gouty RN, BSN Entered By: Baruch Gouty on 06/02/2022 09:06:27 -------------------------------------------------------------------------------- HPI Details Patient Name: Date of Service: Robert Brennan, Robert Brennan 06/02/2022 8:15 A M Medical Record Number: [id number] Patient  Account Number: [id number] Date of Birth/Sex: Treating RN: 1971/06/18 (51 y.o. M) Primary Care Provider: MA Phillis Haggis, MA TTHEW Other Clinician: Referring Provider: Treating Provider/Extender: Duanne Guess MA Phillis Haggis, MA TTHEW Weeks in Treatment: 7 History of Present Illness HPI Description: ADMISSION 04/10/2022 This is a 51 year old male with a past medical history significant for poorly controlled type 2 diabetes mellitus (hemoglobin A1c 11.8% in November 2022). He fell and struck his right anterior tibial surface in October or November 2022. He developed a hematoma but subsequently became infected. He underwent debridement on multiple occasions. Ultimately, he was left with a substantial defect that was skin grafted by plastic surgery. The skin graft has taken well but he still has some open tissue around the periphery. He had 2 new areas of breakdown occur secondary to drainage and moisture. Plastic surgery referred him to the wound care center for further evaluation and management. At the site of his skin graft, there has been excellent take, greater than 90%. Around the periphery, there is a thin rim of exposed fat layer with a bit of slough accumulation. There is granulation tissue forming. Just distal to this, there is a second wound that exposes the fat layer with a fair amount of slough  buildup. A similar, albeit smaller wound is located on the posterior leg. No concern for infection. He does have lymphedema pumps and says that he uses them about every other day. He had venous reflux studies done that were negative. I do not see any evidence of arterial studies. ABIs in clinic today were noncompressible but he has an easily palpable dorsalis pedis and posterior tibialis pulse. 04/17/2022: All of the wounds are little bit smaller today. There is a light layer of slough overlying the surfaces but underneath there is good granulation tissue. The posterior calf wound is nearly closed. 04/24/2022: The posterior wound has closed. Both of the other wounds are smaller today. There is hypertrophic granulation tissue on the more proximal anterior tibial wound and a light layer of slough on the more distal wound. No concern for infection. 05/02/2022: The proximal anterior tibial wound initially appeared to be closed, but there is a small opening underneath the eschar that was present. The more distal wound has contracted and has a light layer of slough with perimeter eschar. No concern for infection. 05/09/2022: The proximal wound is down to just a couple of millimeters. The more distal wound has a band of epithelialized tissue creating 2 separate wounds. The smaller one is very superficial; the larger is a little bit deeper but has good granulation tissue present with just a little bit of slough. 05/17/2022: The proximal wound is closed. The more distal wound is down to just the one larger site. There is granulation tissue and epithelialization present. Light layer of slough and biofilm. 05/24/2022: The remaining wound is continuing to contract. It is clean with good granulation tissue. 06/02/2022: The remaining initial wound is nearly closed with just a little bit of soft eschar on the surface. Unfortunately, he was playing with his child and was kicked in the shin and now he has a small anterior  tibial surface wound. Electronic Signature(s) Signed: 06/02/2022 9:04:30 AM By: Duanne Guess MD FACS Entered By: Duanne Guess on 06/02/2022 09:04:29 -------------------------------------------------------------------------------- Physical Exam Details Patient Name: Date of Service: [name], Robert Brennan 06/02/2022 8:15 A M Medical Record Number: [id number] Patient Account Number: [id number] Date of Birth/Sex: Treating RN: May 18, 1971 (51 y.o. M) Primary Care Provider: MA Phillis Haggis, MA TTHEW Other  Clinician: Referring Provider: Treating Provider/Extender: Fredirick Maudlin MA THIA S, MA TTHEW Weeks in Treatment: 7 Constitutional . . . . No acute distress.Marland Kitchen Respiratory Normal work of breathing on room air.. Notes 06/02/2022: The remaining initial wound is nearly closed with just a little bit of soft eschar on the surface. Unfortunately, he was playing with his child and was kicked in the shin and now he has a small anterior tibial surface wound. Electronic Signature(s) Signed: 06/02/2022 9:05:04 AM By: Fredirick Maudlin MD FACS Entered By: Fredirick Maudlin on 06/02/2022 09:05:03 -------------------------------------------------------------------------------- Physician Orders Details Patient Name: Date of Service: Robert Brennan, Shyhiem 06/02/2022 8:15 A M Medical Record Number: [id number] Patient Account Number: [id number] Date of Birth/Sex: Treating RN: 06-Jan-1971 (51 y.o. [name] Primary Care Provider: MA Durenda Guthrie, [place] TTHEW Other Clinician: Referring Provider: Treating Provider/Extender: Fredirick Maudlin MA Durenda Guthrie, MA TTHEW Weeks in Treatment: 7 Verbal / Phone Orders: No Diagnosis Coding ICD-10 Coding Code Description [phone] Non-pressure chronic ulcer of 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 exposed I89.0 Lymphedema, not elsewhere classified E11.622 Type 2 diabetes mellitus with other skin ulcer Follow-up Appointments ppointment in 1 week. - Dr. Celine Ahr RM 1 with Vaughan Basta Return  A Anesthetic Wound #2 Right,Distal,Lateral Lower Leg (In clinic) Topical Lidocaine 4% applied to wound bed Wound #4 Right,Anterior Lower Leg (In clinic) Topical Lidocaine 4% applied to wound bed Bathing/ Shower/ Hygiene May shower with protection but do not get wound dressing(s) wet. Edema Control - Lymphedema / SCD / Other Lymphedema Pumps. Use Lymphedema pumps on leg(s) 2-3 times a day for 45-60 minutes. If wearing any wraps or hose, do not remove them. Continue exercising as instructed. - use once per day Elevate legs to the level of the heart or above for 30 minutes daily and/or when sitting, a frequency of: - throughout the day Avoid standing for long periods of time. Exercise regularly Compression stocking or Garment 20-30 mm/Hg pressure to: - left leg daily Wound Treatment Wound #2 - Lower Leg Wound Laterality: Right, Lateral, Distal Peri-Wound Care: Sween Lotion (Moisturizing lotion) 1 x Per Week/30 Days Discharge Instructions: Apply moisturizing lotion as directed Prim Dressing: Promogran Prisma Matrix, 4.34 (sq in) (silver collagen) 1 x Per Week/30 Days ary Discharge Instructions: Moisten collagen with saline or hydrogel Secondary Dressing: Drawtex 4x4 in 1 x Per Week/30 Days Discharge Instructions: Apply over primary dressing as directed. Secondary Dressing: Woven Gauze Sponge, Non-Sterile 4x4 in 1 x Per Week/30 Days Discharge Instructions: Apply over primary dressing as directed. Compression Wrap: ThreePress (3 layer compression wrap) 1 x Per Week/30 Days Discharge Instructions: Apply three layer compression as directed. Wound #4 - Lower Leg Wound Laterality: Right, Anterior Peri-Wound Care: Sween Lotion (Moisturizing lotion) 1 x Per Week/30 Days Discharge Instructions: Apply moisturizing lotion as directed Prim Dressing: Promogran Prisma Matrix, 4.34 (sq in) (silver collagen) ary 1 x Per Week/30 Days Discharge Instructions: Moisten collagen with saline or  hydrogel Secondary Dressing: Drawtex 4x4 in 1 x Per Week/30 Days Discharge Instructions: Apply over primary dressing as directed. Secondary Dressing: Woven Gauze Sponge, Non-Sterile 4x4 in 1 x Per Week/30 Days Discharge Instructions: Apply over primary dressing as directed. Compression Wrap: ThreePress (3 layer compression wrap) 1 x Per Week/30 Days Discharge Instructions: Apply three layer compression as directed. Electronic Signature(s) Signed: 06/02/2022 9:11:13 AM By: Fredirick Maudlin MD FACS Signed: 06/02/2022 6:08:36 PM By: Baruch Gouty RN, BSN Previous Signature: 06/02/2022 9:05:09 AM Version By: Fredirick Maudlin MD FACS Entered By: Baruch Gouty on 06/02/2022 09:05:25 --------------------------------------------------------------------------------  Problem List Details Patient Name: Date of Service: Robert Brennan, Shadrack 06/02/2022 8:15 A M Medical Record Number: [id number] Patient Account Number: [id number] Date of Birth/Sex: Treating RN: April 23, 1971 (51 y.o. [name] Primary Care Provider: MA Durenda Guthrie, [place] TTHEW Other Clinician: Referring Provider: Treating Provider/Extender: Fredirick Maudlin MA Durenda Guthrie, [place] TTHEW Weeks in Treatment: 7 Active Problems ICD-10 Encounter Code Description Active Date MDM Diagnosis [phone] Non-pressure chronic ulcer of 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 04/10/2022 No Yes exposed I89.0 Lymphedema, not elsewhere classified 04/10/2022 No Yes E11.622 Type 2 diabetes mellitus with other skin ulcer 04/10/2022 No Yes Inactive Problems Resolved Problems Electronic Signature(s) Signed: 06/02/2022 9:03:07 AM By: Fredirick Maudlin MD FACS Entered By: Fredirick Maudlin on 06/02/2022 09:03:06 -------------------------------------------------------------------------------- Progress Note Details Patient Name: Date of Service: Robert Brennan, Ajene 06/02/2022 8:15 A M Medical Record Number: [id number] Patient Account Number: [id number] Date of Birth/Sex: Treating  RN: [birth year] (51 y.o. M) Primary Care Provider: Other Clinician: MA Durenda Guthrie, MA TTHEW Referring Provider: Treating Provider/Extender: Fredirick Maudlin MA Durenda Guthrie, MA TTHEW Weeks in Treatment: 7 Subjective Chief Complaint Information obtained from Patient Patient seen for complaints of Non-Healing Wound. History of Present Illness (HPI) ADMISSION 04/10/2022 This is a 51 year old male with a past medical history significant for poorly controlled type 2 diabetes mellitus (hemoglobin A1c 11.8% in November 2022). He fell and struck his right anterior tibial surface in October or November 2022. He developed a hematoma but subsequently became infected. He underwent debridement on multiple occasions. Ultimately, he was left with a substantial defect that was skin grafted by plastic surgery. The skin graft has taken well but he still has some open tissue around the periphery. He had 2 new areas of breakdown occur secondary to drainage and moisture. Plastic surgery referred him to the wound care center for further evaluation and management. At the site of his skin graft, there has been excellent take, greater than 90%. Around the periphery, there is a thin rim of exposed fat layer with a bit of slough accumulation. There is granulation tissue forming. Just distal to this, there is a second wound that exposes the fat layer with a fair amount of slough buildup. A similar, albeit smaller wound is located on the posterior leg. No concern for infection. He does have lymphedema pumps and says that he uses them about every other day. He had venous reflux studies done that were negative. I do not see any evidence of arterial studies. ABIs in clinic today were noncompressible but he has an easily palpable dorsalis pedis and posterior tibialis pulse. 04/17/2022: All of the wounds are little bit smaller today. There is a light layer of slough overlying the surfaces but underneath there is good granulation  tissue. The posterior calf wound is nearly closed. 04/24/2022: The posterior wound has closed. Both of the other wounds are smaller today. There is hypertrophic granulation tissue on the more proximal anterior tibial wound and a light layer of slough on the more distal wound. No concern for infection. 05/02/2022: The proximal anterior tibial wound initially appeared to be closed, but there is a small opening underneath the eschar that was present. The more distal wound has contracted and has a light layer of slough with perimeter eschar. No concern for infection. 05/09/2022: The proximal wound is down to just a couple of millimeters. The more distal wound has a band of epithelialized tissue creating 2 separate wounds. The smaller one is very superficial; the larger is a little  bit deeper but has good granulation tissue present with just a little bit of slough. 05/17/2022: The proximal wound is closed. The more distal wound is down to just the one larger site. There is granulation tissue and epithelialization present. Light layer of slough and biofilm. 05/24/2022: The remaining wound is continuing to contract. It is clean with good granulation tissue. 06/02/2022: The remaining initial wound is nearly closed with just a little bit of soft eschar on the surface. Unfortunately, he was playing with his child and was kicked in the shin and now he has a small anterior tibial surface wound. Patient History Information obtained from Patient, Chart. Family History Cancer - Father,Paternal Grandparents, Diabetes - Maternal Grandparents,Father, Hypertension - Mother,Father, No family history of Heart Disease, Hereditary Spherocytosis, Kidney Disease, Lung Disease, Seizures, Stroke, Thyroid Problems, Tuberculosis. Social History Never smoker, Marital Status - Married, Alcohol Use - Never, Drug Use - No History, Caffeine Use - Moderate. Medical History Eyes Denies history of Cataracts, Glaucoma, Optic  Neuritis Cardiovascular Patient has history of Hypertension Endocrine Patient has history of Type II Diabetes Denies history of Type I Diabetes Integumentary (Skin) Denies history of History of Burn Musculoskeletal Patient has history of Gout Neurologic Patient has history of Neuropathy Oncologic Denies history of Received Chemotherapy, Received Radiation Psychiatric Denies history of Anorexia/bulimia, Confinement Anxiety Hospitalization/Surgery History - debridements of right leg ulcer. - split thickness skin graft right lower leg. Medical A Surgical History Notes nd Cardiovascular hyperlipidemia Objective Constitutional No acute distress.. Vitals Time Taken: 8:43 AM, Height: 72 in, Weight: 280 lbs, BMI: 38, Temperature: 98.7 F, Pulse: 88 bpm, Respiratory Rate: 18 breaths/min, Blood Pressure: 134/84 mmHg, Capillary Blood Glucose: 130 mg/dl. General Notes: glucose per pt report this am Respiratory Normal work of breathing on room air.. General Notes: 06/02/2022: The remaining initial wound is nearly closed with just a little bit of soft eschar on the surface. Unfortunately, he was playing with his child and was kicked in the shin and now he has a small anterior tibial surface wound. Integumentary (Hair, Skin) Wound #2 status is Open. Original cause of wound was Trauma. The date acquired was: 09/26/2021. The wound has been in treatment 7 weeks. The wound is located on the Right,Distal,Lateral Lower Leg. The wound measures 0.1cm length x 0.1cm width x 0.1cm depth; 0.008cm^2 area and 0.001cm^3 volume. There is Fat Layer (Subcutaneous Tissue) exposed. There is no tunneling or undermining noted. There is a small amount of serosanguineous drainage noted. The wound margin is flat and intact. There is small (1-33%) red granulation within the wound bed. There is no necrotic tissue within the wound bed. Wound #4 status is Open. Original cause of wound was Trauma. The date acquired was:  05/31/2022. The wound is located on the Right,Anterior Lower Leg. The wound measures 0.8cm length x 0.6cm width x 0.1cm depth; 0.377cm^2 area and 0.038cm^3 volume. There is Fat Layer (Subcutaneous Tissue) exposed. There is no tunneling or undermining noted. There is a medium amount of serosanguineous drainage noted. The wound margin is flat and intact. There is medium (34-66%) pink granulation within the wound bed. There is a medium (34-66%) amount of necrotic tissue within the wound bed including Adherent Slough. Assessment Active Problems ICD-10 Non-pressure chronic ulcer of 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 exposed Lymphedema, not elsewhere classified Type 2 diabetes mellitus with other skin ulcer Procedures Wound #2 Pre-procedure diagnosis of Wound #2 is a Diabetic Wound/Ulcer of the Lower Extremity located on the Right,Distal,Lateral Lower Leg .Severity  of Tissue Pre Debridement is: Fat layer exposed. There was a Selective/Open Wound Non-Viable Tissue Debridement with a total area of 0.04 sq cm performed by Duanne Guess, MD. With the following instrument(s): Curette to remove Non-Viable tissue/material. Material removed includes Eschar after achieving pain control using Lidocaine 4% T opical Solution. No specimens were taken. A time out was conducted at 09:00, prior to the start of the procedure. A Minimum amount of bleeding was controlled with Pressure. The procedure was tolerated well with a pain level of 0 throughout and a pain level of 0 following the procedure. Post Debridement Measurements: 0.2cm length x 0.2cm width x 0.1cm depth; 0.003cm^3 volume. Character of Wound/Ulcer Post Debridement is improved. Severity of Tissue Post Debridement is: Fat layer exposed. Post procedure Diagnosis Wound #2: Same as Pre-Procedure Wound #4 Pre-procedure diagnosis of Wound #4 is a Diabetic Wound/Ulcer of the Lower Extremity located on the Right,Anterior Lower Leg .Severity of Tissue  Pre Debridement is: Fat layer exposed. There was a Selective/Open Wound Non-Viable Tissue Debridement with a total area of 0.48 sq cm performed by Duanne Guess, MD. With the following instrument(s): Curette to remove Non-Viable tissue/material. Material removed includes Biofilm after achieving pain control using Lidocaine 4% T opical Solution. No specimens were taken. A time out was conducted at 09:00, prior to the start of the procedure. A Minimum amount of bleeding was controlled with Pressure. The procedure was tolerated well with a pain level of 0 throughout and a pain level of 0 following the procedure. Post Debridement Measurements: 0.8cm length x 0.6cm width x 0.1cm depth; 0.038cm^3 volume. Character of Wound/Ulcer Post Debridement is improved. Severity of Tissue Post Debridement is: Fat layer exposed. Post procedure Diagnosis Wound #4: Same as Pre-Procedure Plan 06/02/2022: The remaining initial wound is nearly closed with just a little bit of soft eschar on the surface. Unfortunately, he was playing with his child and was kicked in the shin and now he has a small anterior tibial surface wound. I used a curette to debride biofilm from the new wound and some soft eschar from the initial wound. We will continue to use silver alginate with 3 layer compression. I anticipate that both wounds will be healed next week. I will see him then. Electronic Signature(s) Signed: 06/02/2022 9:05:40 AM By: Duanne Guess MD FACS Entered By: Duanne Guess on 06/02/2022 09:05:39 -------------------------------------------------------------------------------- HxROS Details Patient Name: Date of Service: [name], Robert Brennan 06/02/2022 8:15 A M Medical Record Number: [id number] Patient Account Number: [id number] Date of Birth/Sex: Treating RN: [birth year] (51 y.o. M) Primary Care Provider: MA Phillis Haggis, MA TTHEW Other Clinician: Referring Provider: Treating Provider/Extender: Duanne Guess MA Phillis Haggis, MA  TTHEW Weeks in Treatment: 7 Information Obtained From Patient Chart Eyes Medical History: Negative for: Cataracts; Glaucoma; Optic Neuritis Cardiovascular Medical History: Positive for: Hypertension Past Medical History Notes: hyperlipidemia Endocrine Medical History: Positive for: Type II Diabetes Negative for: Type I Diabetes Time with diabetes: 21 yrs Treated with: Insulin, Oral agents Blood sugar tested every day: No Integumentary (Skin) Medical History: Negative for: History of Burn Musculoskeletal Medical History: Positive for: Gout Neurologic Medical History: Positive for: Neuropathy Oncologic Medical History: Negative for: Received Chemotherapy; Received Radiation Psychiatric Medical History: Negative for: Anorexia/bulimia; Confinement Anxiety Immunizations Pneumococcal Vaccine: Received Pneumococcal Vaccination: Yes Received Pneumococcal Vaccination On or After 60th Birthday: No Implantable Devices No devices added Hospitalization / Surgery History Type of Hospitalization/Surgery debridements of right leg ulcer split thickness skin graft right lower leg Family and Social History Cancer: Yes -  Father,Paternal Grandparents; Diabetes: Yes - Maternal Grandparents,Father; Heart Disease: No; Hereditary Spherocytosis: No; Hypertension: Yes - Mother,Father; Kidney Disease: No; Lung Disease: No; Seizures: No; Stroke: No; Thyroid Problems: No; Tuberculosis: No; Never smoker; Marital Status - Married; Alcohol Use: Never; Drug Use: No History; Caffeine Use: Moderate; Financial Concerns: No; Food, Clothing or Shelter Needs: No; Support System Lacking: No; Transportation Concerns: No Electronic Signature(s) Signed: 06/02/2022 9:11:13 AM By: Fredirick Maudlin MD FACS Entered By: Fredirick Maudlin on 06/02/2022 09:04:44 -------------------------------------------------------------------------------- SuperBill Details Patient Name: Date of Service: Robert Brennan, Robert Brennan  06/02/2022 Medical Record Number: [id number] Patient Account Number: [id number] Date of Birth/Sex: Treating RN: 1971-09-21 (51 y.o. M) Primary Care Provider: MA Durenda Guthrie, [place] TTHEW Other Clinician: Referring Provider: Treating Provider/Extender: Fredirick Maudlin MA Durenda Guthrie, [place] TTHEW Weeks in Treatment: 7 Diagnosis Coding ICD-10 Codes Code Description [phone] Non-pressure chronic ulcer of 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 exposed I89.0 Lymphedema, not elsewhere classified E11.622 Type 2 diabetes mellitus with other skin ulcer Facility Procedures CPT4 Code: [id number] Description: [phone] - DEBRIDE WOUND 1ST 20 SQ CM OR < ICD-10 Diagnosis Description 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 of 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 expos Modifier: ed Quantity: 1 Physician Procedures : CPT4 Code Description Modifier [id number] - WC PHYS LEVEL 3 - EST PT 25 ICD-10 Diagnosis Description 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 of 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 exposed E11.622 Type 2 diabetes mellitus with other skin ulcer  I89.0 Lymphedema, not elsewhere classified Quantity: 1 : [id number] - WC PHYS DEBR WO ANESTH 20 SQ CM ICD-10 Diagnosis Description [id number] Non-pressure chronic ulcer of 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 exposed Quantity: 1 Electronic Signature(s) Signed: 06/02/2022 9:06:02 AM By: Fredirick Maudlin MD FACS Entered By: Fredirick Maudlin on 06/02/2022 [place]

## 2022-06-09 ENCOUNTER — Encounter (HOSPITAL_BASED_OUTPATIENT_CLINIC_OR_DEPARTMENT_OTHER): Payer: BC Managed Care – PPO | Admitting: General Surgery

## 2022-06-15 ENCOUNTER — Encounter (HOSPITAL_BASED_OUTPATIENT_CLINIC_OR_DEPARTMENT_OTHER): Payer: BC Managed Care – PPO | Admitting: General Surgery

## 2022-06-15 DIAGNOSIS — E11622 Type 2 diabetes mellitus with other skin ulcer: Secondary | ICD-10-CM | POA: Diagnosis not present

## 2022-06-15 NOTE — Progress Notes (Signed)
Brennan, Robert Emanuell ([id number]) Visit Report for 06/15/2022 Chief Complaint Document Details Patient Name: Date of Service: Robert Brennan, Thunder 06/15/2022 7:30 A M Medical Record Number: [id number] Patient Account Number: [id number] Date of Birth/Sex: Treating RN: 10-Mar-1971 (51 y.o. [name] Primary Care Provider: MA Durenda Guthrie, [place] TTHEW Other Clinician: Referring Provider: Treating Provider/Extender: Fredirick Maudlin MA Durenda Guthrie, [place] TTHEW Weeks in Treatment: 9 Information Obtained from: Patient Chief Complaint Patient seen for complaints of Non-Healing Wound. Electronic Signature(s) Signed: 06/15/2022 9:04:18 AM By: Fredirick Maudlin MD FACS Entered By: Fredirick Maudlin on 06/15/2022 09:04:18 -------------------------------------------------------------------------------- HPI Details Patient Name: Date of Service: Robert Brennan, Da 06/15/2022 7:30 A M Medical Record Number: [id number] Patient Account Number: [id number] Date of Birth/Sex: Treating RN: Oct 17, 1970 (51 y.o. [name] Primary Care Provider: MA Durenda Guthrie, [place] TTHEW Other Clinician: Referring Provider: Treating Provider/Extender: Fredirick Maudlin MA Durenda Guthrie, [place] TTHEW Weeks in Treatment: 9 History of Present Illness HPI Description: ADMISSION 04/10/2022 This is a 51 year old male with a past medical history significant for poorly controlled type 2 diabetes mellitus (hemoglobin A1c 11.8% in November 2022). He fell and struck his right anterior tibial surface in October or November 2022. He developed a hematoma but subsequently became infected. He underwent debridement on multiple occasions. Ultimately, he was left with a substantial defect that was skin grafted by plastic surgery. The skin graft has taken well but he still has some open tissue around the periphery. He had 2 new areas of breakdown occur secondary to drainage and moisture. Plastic surgery referred him to the wound care center for further evaluation and management. At the site  of his skin graft, there has been excellent take, greater than 90%. Around the periphery, there is a thin rim of exposed fat layer with a bit of slough accumulation. There is granulation tissue forming. Just distal to this, there is a second wound that exposes the fat layer with a fair amount of slough buildup. A similar, albeit smaller wound is located on the posterior leg. No concern for infection. He does have lymphedema pumps and says that he uses them about every other day. He had venous reflux studies done that were negative. I do not see any evidence of arterial studies. ABIs in clinic today were noncompressible but he has an easily palpable dorsalis pedis and posterior tibialis pulse. 04/17/2022: All of the wounds are little bit smaller today. There is a light layer of slough overlying the surfaces but underneath there is good granulation tissue. The posterior calf wound is nearly closed. 04/24/2022: The posterior wound has closed. Both of the other wounds are smaller today. There is hypertrophic granulation tissue on the more proximal anterior tibial wound and a light layer of slough on the more distal wound. No concern for infection. 05/02/2022: The proximal anterior tibial wound initially appeared to be closed, but there is a small opening underneath the eschar that was present. The more distal wound has contracted and has a light layer of slough with perimeter eschar. No concern for infection. 05/09/2022: The proximal wound is down to just a couple of millimeters. The more distal wound has a band of epithelialized tissue creating 2 separate wounds. The smaller one is very superficial; the larger is a little bit deeper but has good granulation tissue present with just a little bit of slough. 05/17/2022: The proximal wound is closed. The more distal wound is down to just the one larger site. There is granulation tissue and epithelialization present. Light layer of slough  and biofilm. 05/24/2022:  The remaining wound is continuing to contract. It is clean with good granulation tissue. 06/02/2022: The remaining initial wound is nearly closed with just a little bit of soft eschar on the surface. Unfortunately, he was playing with his child and was kicked in the shin and now he has a small anterior tibial surface wound. 06/15/2022: His wounds are healed. Electronic Signature(s) Signed: 06/15/2022 9:04:35 AM By: Duanne Guess MD FACS Entered By: Duanne Guess on 06/15/2022 09:04:34 -------------------------------------------------------------------------------- Physical Exam Details Patient Name: Date of Service: Robert Brennan, Tray 06/15/2022 7:30 A M Medical Record Number: [id number] Patient Account Number: [id number] Date of Birth/Sex: Treating RN: Aug 08, 1971 (51 y.o. Robert Brennan Primary Care Provider: MA Phillis Haggis, [place] TTHEW Other Clinician: Referring Provider: Treating Provider/Extender: Duanne Guess MA THIA S, [place] TTHEW Weeks in Treatment: 9 Constitutional .Tachycardic, asymptomatic.. . . No acute distress.Marland Kitchen Respiratory Normal work of breathing on room air.. Notes 06/15/2022: His wounds are healed. Electronic Signature(s) Signed: 06/15/2022 9:05:08 AM By: Duanne Guess MD FACS Entered By: Duanne Guess on 06/15/2022 09:05:07 -------------------------------------------------------------------------------- Physician Orders Details Patient Name: Date of Service: Robert Brennan, Ermias 06/15/2022 7:30 A M Medical Record Number: [id number] Patient Account Number: [id number] Date of Birth/Sex: Treating RN: [birth year] (51 y.o. Robert Brennan Primary Care Provider: MA Phillis Haggis, [place] TTHEW Other Clinician: Referring Provider: Treating Provider/Extender: Duanne Guess MA Phillis Haggis, [place] TTHEW Weeks in Treatment: 9 Verbal / Phone Orders: No Diagnosis Coding ICD-10 Coding Code Description [phone] Non-pressure chronic ulcer of 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 exposed I89.0  Lymphedema, not elsewhere classified E11.622 Type 2 diabetes mellitus with other skin ulcer Discharge From [facility] Discharge from Wound Care Center Bathing/ Shower/ Hygiene May shower and wash wound with soap and water. Edema Control - Lymphedema / SCD / Other Lymphedema Pumps. Use Lymphedema pumps on leg(s) 2-3 times a day for 45-60 minutes. If wearing any wraps or hose, do not remove them. Continue exercising as instructed. - use once per day Elevate legs to the level of the heart or above for 30 minutes daily and/or when sitting, a frequency of: - throughout the day Avoid standing for long periods of time. Exercise regularly Compression stocking or Garment 20-30 mm/Hg pressure to: - both legs daily Electronic Signature(s) Signed: 06/15/2022 9:05:20 AM By: Duanne Guess MD FACS Entered By: Duanne Guess on 06/15/2022 09:05:20 -------------------------------------------------------------------------------- Problem List Details Patient Name: Date of Service: Robert Brennan, Jozef 06/15/2022 7:30 A M Medical Record Number: [id number] Patient Account Number: [id number] Date of Birth/Sex: Treating RN: 12/19/1970 (51 y.o. Robert Brennan Primary Care Provider: MA Phillis Haggis, [place] TTHEW Other Clinician: Referring Provider: Treating Provider/Extender: Duanne Guess MA Phillis Haggis, [place] TTHEW Weeks in Treatment: 9 Active Problems ICD-10 Encounter Code Description Active Date MDM Diagnosis 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 of 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 04/10/2022 No Yes exposed I89.0 Lymphedema, not elsewhere classified 04/10/2022 No Yes E11.622 Type 2 diabetes mellitus with other skin ulcer 04/10/2022 No Yes Inactive Problems Resolved Problems Electronic Signature(s) Signed: 06/15/2022 9:04:09 AM By: Duanne Guess MD FACS Entered By: Duanne Guess on 06/15/2022 09:04:09 -------------------------------------------------------------------------------- Progress Note  Details Patient Name: Date of Service: Robert Brennan, Syd 06/15/2022 7:30 A M Medical Record Number: [id number] Patient Account Number: [id number] Date of Birth/Sex: Treating RN: [birth year] (51 y.o. Robert Brennan Primary Care Provider: MA Phillis Haggis, [place] TTHEW Other Clinician: Referring Provider: Treating Provider/Extender: Duanne Guess MA THIA S, [place] TTHEW Weeks in Treatment: 9  Subjective Chief Complaint Information obtained from Patient Patient seen for complaints of Non-Healing Wound. History of Present Illness (HPI) ADMISSION 04/10/2022 This is a 51 year old male with a past medical history significant for poorly controlled type 2 diabetes mellitus (hemoglobin A1c 11.8% in November 2022). He fell and struck his right anterior tibial surface in October or November 2022. He developed a hematoma but subsequently became infected. He underwent debridement on multiple occasions. Ultimately, he was left with a substantial defect that was skin grafted by plastic surgery. The skin graft has taken well but he still has some open tissue around the periphery. He had 2 new areas of breakdown occur secondary to drainage and moisture. Plastic surgery referred him to the wound care center for further evaluation and management. At the site of his skin graft, there has been excellent take, greater than 90%. Around the periphery, there is a thin rim of exposed fat layer with a bit of slough accumulation. There is granulation tissue forming. Just distal to this, there is a second wound that exposes the fat layer with a fair amount of slough buildup. A similar, albeit smaller wound is located on the posterior leg. No concern for infection. He does have lymphedema pumps and says that he uses them about every other day. He had venous reflux studies done that were negative. I do not see any evidence of arterial studies. ABIs in clinic today were noncompressible but he has an easily palpable dorsalis pedis and  posterior tibialis pulse. 04/17/2022: All of the wounds are little bit smaller today. There is a light layer of slough overlying the surfaces but underneath there is good granulation tissue. The posterior calf wound is nearly closed. 04/24/2022: The posterior wound has closed. Both of the other wounds are smaller today. There is hypertrophic granulation tissue on the more proximal anterior tibial wound and a light layer of slough on the more distal wound. No concern for infection. 05/02/2022: The proximal anterior tibial wound initially appeared to be closed, but there is a small opening underneath the eschar that was present. The more distal wound has contracted and has a light layer of slough with perimeter eschar. No concern for infection. 05/09/2022: The proximal wound is down to just a couple of millimeters. The more distal wound has a band of epithelialized tissue creating 2 separate wounds. The smaller one is very superficial; the larger is a little bit deeper but has good granulation tissue present with just a little bit of slough. 05/17/2022: The proximal wound is closed. The more distal wound is down to just the one larger site. There is granulation tissue and epithelialization present. Light layer of slough and biofilm. 05/24/2022: The remaining wound is continuing to contract. It is clean with good granulation tissue. 06/02/2022: The remaining initial wound is nearly closed with just a little bit of soft eschar on the surface. Unfortunately, he was playing with his child and was kicked in the shin and now he has a small anterior tibial surface wound. 06/15/2022: His wounds are healed. Patient History Information obtained from Patient, Chart. Family History Cancer - Father,Paternal Grandparents, Diabetes - Maternal Grandparents,Father, Hypertension - Mother,Father, No family history of Heart Disease, Hereditary Spherocytosis, Kidney Disease, Lung Disease, Seizures, Stroke, Thyroid Problems,  Tuberculosis. Social History Never smoker, Marital Status - Married, Alcohol Use - Never, Drug Use - No History, Caffeine Use - Moderate. Medical History Eyes Denies history of Cataracts, Glaucoma, Optic Neuritis Cardiovascular Patient has history of Hypertension Endocrine Patient has history of  Type II Diabetes Denies history of Type I Diabetes Integumentary (Skin) Denies history of History of Burn Musculoskeletal Patient has history of Gout Neurologic Patient has history of Neuropathy Oncologic Denies history of Received Chemotherapy, Received Radiation Psychiatric Denies history of Anorexia/bulimia, Confinement Anxiety Hospitalization/Surgery History - debridements of right leg ulcer. - split thickness skin graft right lower leg. Medical A Surgical History Notes nd Cardiovascular hyperlipidemia Objective Constitutional Tachycardic, asymptomatic.Marland Kitchen No acute distress.. Vitals Time Taken: 7:53 AM, Height: 72 in, Weight: 280 lbs, BMI: 38, Temperature: 98.3 F, Pulse: 116 bpm, Respiratory Rate: 18 breaths/min, Blood Pressure: 139/94 mmHg. Respiratory Normal work of breathing on room air.. General Notes: 06/15/2022: His wounds are healed. Integumentary (Hair, Skin) Wound #2 status is Healed - Epithelialized. Original cause of wound was Trauma. The date acquired was: 09/26/2021. The wound has been in treatment 9 weeks. The wound is located on the Right,Distal,Lateral Lower Leg. The wound measures 0cm length x 0cm width x 0cm depth; 0cm^2 area and 0cm^3 volume. There is no tunneling or undermining noted. There is a none present amount of drainage noted. There is no granulation within the wound bed. There is no necrotic tissue within the wound bed. Wound #4 status is Healed - Epithelialized. Original cause of wound was Trauma. The date acquired was: 05/31/2022. The wound has been in treatment 1 weeks. The wound is located on the Right,Anterior Lower Leg. The wound measures 0cm length x  0cm width x 0cm depth; 0cm^2 area and 0cm^3 volume. There is no tunneling or undermining noted. There is a none present amount of drainage noted. There is no granulation within the wound bed. There is no necrotic tissue within the wound bed. Assessment Active Problems ICD-10 Non-pressure chronic ulcer of 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 exposed Lymphedema, not elsewhere classified Type 2 diabetes mellitus with other skin ulcer Plan Discharge From [facility] Services: Discharge from Wound Care Center Bathing/ Shower/ Hygiene: May shower and wash wound with soap and water. Edema Control - Lymphedema / SCD / Other: Lymphedema Pumps. Use Lymphedema pumps on leg(s) 2-3 times a day for 45-60 minutes. If wearing any wraps or hose, do not remove them. Continue exercising as instructed. - use once per day Elevate legs to the level of the heart or above for 30 minutes daily and/or when sitting, a frequency of: - throughout the day Avoid standing for long periods of time. Exercise regularly Compression stocking or Garment 20-30 mm/Hg pressure to: - both legs daily 06/15/2022: His wounds are healed. He was reminded that he needs to wear compression stockings religiously. He should continue to use his lymphedema pumps on a daily basis. We will discharge him from wound care center. He may follow-up as needed. Electronic Signature(s) Signed: 06/15/2022 9:05:52 AM By: Duanne Guess MD FACS Entered By: Duanne Guess on 06/15/2022 09:05:52 -------------------------------------------------------------------------------- HxROS Details Patient Name: Date of Service: Robert Brennan, Kylor 06/15/2022 7:30 A M Medical Record Number: [id number] Patient Account Number: [id number] Date of Birth/Sex: Treating RN: [birth year] (51 y.o. Robert Brennan Primary Care Provider: MA Phillis Haggis, [place] TTHEW Other Clinician: Referring Provider: Treating Provider/Extender: Duanne Guess MA Phillis Haggis, [place] TTHEW Weeks in  Treatment: 9 Information Obtained From Patient Chart Eyes Medical History: Negative for: Cataracts; Glaucoma; Optic Neuritis Cardiovascular Medical History: Positive for: Hypertension Past Medical History Notes: hyperlipidemia Endocrine Medical History: Positive for: Type II Diabetes Negative for: Type I Diabetes Time with diabetes: 21 yrs Treated with: Insulin, Oral agents Blood sugar tested every day: No  Integumentary (Skin) Medical History: Negative for: History of Burn Musculoskeletal Medical History: Positive for: Gout Neurologic Medical History: Positive for: Neuropathy Oncologic Medical History: Negative for: Received Chemotherapy; Received Radiation Psychiatric Medical History: Negative for: Anorexia/bulimia; Confinement Anxiety Immunizations Pneumococcal Vaccine: Received Pneumococcal Vaccination: Yes Received Pneumococcal Vaccination On or After 60th Birthday: No Implantable Devices No devices added Hospitalization / Surgery History Type of Hospitalization/Surgery debridements of right leg ulcer split thickness skin graft right lower leg Family and Social History Cancer: Yes - Father,Paternal Grandparents; Diabetes: Yes - Maternal Grandparents,Father; Heart Disease: No; Hereditary Spherocytosis: No; Hypertension: Yes - Mother,Father; Kidney Disease: No; Lung Disease: No; Seizures: No; Stroke: No; Thyroid Problems: No; Tuberculosis: No; Never smoker; Marital Status - Married; Alcohol Use: Never; Drug Use: No History; Caffeine Use: Moderate; Financial Concerns: No; Food, Clothing or Shelter Needs: No; Support System Lacking: No; Transportation Concerns: No Psychologist, prison and probation services) Signed: 06/15/2022 9:25:12 AM By: Duanne Guess MD FACS Signed: 06/15/2022 5:15:24 PM By: Zenaida Deed RN, BSN Entered By: Duanne Guess on 06/15/2022 09:04:39 -------------------------------------------------------------------------------- SuperBill Details Patient  Name: Date of Service: Robert Brennan, Jeremiyah 06/15/2022 Medical Record Number: [id number] Patient Account Number: [id number] Date of Birth/Sex: Treating RN: [birth year] (51 y.o. Robert Brennan Primary Care Provider: MA Phillis Haggis, [place] TTHEW Other Clinician: Referring Provider: Treating Provider/Extender: Duanne Guess MA Phillis Hagg[place], [place] TTHEW Weeks in Treatment: 9 Diagnosis Coding ICD-10 Codes Code Description [phone] Non-pressure chronic ulcer of 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 exposed I89.0 Lymphedema, not elsewhere classified E11.622 Type 2 diabetes mellitus with other skin ulcer Facility Procedures CPT4 Code: [id number] Description: 99213 - WOUND CARE VISIT-LEV 3 EST PT Modifier: Quantity: 1 Physician Procedures : CPT4 Code Description Modifier [id number] - WC PHYS LEVEL 3 - EST PT ICD-10 Diagnosis Description 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 of 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 exposed I89.0 Lymphedema, not elsewhere classified E11.622 Type 2  diabetes mellitus with other skin ulcer Quantity: 1 Electronic Signature(s) Signed: 06/15/2022 9:06:04 AM By: Duanne Guess MD FACS Entered By: Duanne Guess on 06/15/2022 09:06:03

## 2022-06-15 NOTE — Progress Notes (Signed)
Nicol, Ohlmann Adrean ([id number]) Visit Report for 06/15/2022 Arrival Information Details Patient Name: Date of Service: Robert Brennan, Robert Brennan 06/15/2022 7:30 A M Medical Record Number: [id number] Patient Account Number: [id number] Date of Birth/Sex: Treating RN: December 07, 1970 (51 y.o. [name] Primary Care Antonietta Lansdowne: MA Durenda Guthrie, [place] TTHEW Other Clinician: Referring Herrick Hartog: Treating [name]/Extender: Fredirick Maudlin MA Durenda Guthrie, [place] TTHEW Weeks in Treatment: 9 Visit Information History Since Last Visit Added or deleted any medications: No Patient Arrived: Ambulatory Any new allergies or adverse reactions: No Arrival Time: 07:51 Had a fall or experienced change in No Accompanied By: self activities of daily living that may affect Transfer Assistance: None risk of falls: Patient Identification Verified: Yes Signs or symptoms of abuse/neglect since last visito No Secondary Verification Process Completed: Yes Hospitalized since last visit: No Patient Requires Transmission-Based Precautions: No Implantable device outside of the clinic excluding No Patient Has Alerts: No cellular tissue based products placed in the center since last visit: Has Dressing in Place as Prescribed: Yes Has Compression in Place as Prescribed: Yes Pain Present Now: No Electronic Signature(s) Signed: 06/15/2022 5:15:24 PM By: Baruch Gouty RN, BSN Entered By: Baruch Gouty on 06/15/2022 07:51:53 -------------------------------------------------------------------------------- Clinic Level of Care Assessment Details Patient Name: Date of Service: Robert Brennan, Harlyn 06/15/2022 7:30 A M Medical Record Number: [id number] Patient Account Number: [id number] Date of Birth/Sex: Treating RN: 1971-06-04 (51 y.o. [name] Primary Care Velecia Ovitt: MA Durenda Guthrie, [place] TTHEW Other Clinician: Referring [name]: Treating [name]/Extender: Fredirick Maudlin MA Durenda Guthrie, [place] TTHEW Weeks in Treatment: 9 Clinic Level of Care Assessment  Items TOOL 4 Quantity Score []  - 0 Use when only an EandM is performed on FOLLOW-UP visit ASSESSMENTS - Nursing Assessment / Reassessment X- 1 10 Reassessment of Co-morbidities (includes updates in patient status) X- 1 5 Reassessment of Adherence to Treatment Plan ASSESSMENTS - Wound and Skin A ssessment / Reassessment []  - 0 Simple Wound Assessment / Reassessment - one wound X- 2 5 Complex Wound Assessment / Reassessment - multiple wounds []  - 0 Dermatologic / Skin Assessment (not related to wound area) ASSESSMENTS - Focused Assessment X- 1 5 Circumferential Edema Measurements - multi extremities []  - 0 Nutritional Assessment / Counseling / Intervention X- 1 5 Lower Extremity Assessment (monofilament, tuning fork, pulses) []  - 0 Peripheral Arterial Disease Assessment (using hand held doppler) ASSESSMENTS - Ostomy and/or Continence Assessment and Care []  - 0 Incontinence Assessment and Management []  - 0 Ostomy Care Assessment and Management (repouching, etc.) PROCESS - Coordination of Care X - Simple Patient / Family Education for ongoing care 1 15 []  - 0 Complex (extensive) Patient / Family Education for ongoing care X- 1 10 Staff obtains Programmer, systems, Records, T Results / Process Orders est []  - 0 Staff telephones HHA, Nursing Homes / Clarify orders / etc []  - 0 Routine Transfer to another Facility (non-emergent condition) []  - 0 Routine Hospital Admission (non-emergent condition) []  - 0 New Admissions / Biomedical engineer / Ordering NPWT Apligraf, etc. , []  - 0 Emergency Hospital Admission (emergent condition) X- 1 10 Simple Discharge Coordination []  - 0 Complex (extensive) Discharge Coordination PROCESS - Special Needs []  - 0 Pediatric / Minor Patient Management []  - 0 Isolation Patient Management []  - 0 Hearing / Language / Visual special needs []  - 0 Assessment of Community assistance (transportation, D/C planning, etc.) []  - 0 Additional  assistance / Altered mentation []  - 0 Support Surface(s) Assessment (bed, cushion, seat, etc.) INTERVENTIONS - Wound Cleansing / Measurement []  -  0 Simple Wound Cleansing - one wound X- 1 5 Complex Wound Cleansing - multiple wounds X- 1 5 Wound Imaging (photographs - any number of wounds) []  - 0 Wound Tracing (instead of photographs) []  - 0 Simple Wound Measurement - one wound []  - 0 Complex Wound Measurement - multiple wounds INTERVENTIONS - Wound Dressings []  - 0 Small Wound Dressing one or multiple wounds []  - 0 Medium Wound Dressing one or multiple wounds []  - 0 Large Wound Dressing one or multiple wounds []  - 0 Application of Medications - topical []  - 0 Application of Medications - injection INTERVENTIONS - Miscellaneous []  - 0 External ear exam []  - 0 Specimen Collection (cultures, biopsies, blood, body fluids, etc.) []  - 0 Specimen(s) / Culture(s) sent or taken to Lab for analysis []  - 0 Patient Transfer (multiple staff / Civil Service fast streamer / Similar devices) []  - 0 Simple Staple / Suture removal (25 or less) []  - 0 Complex Staple / Suture removal (26 or more) []  - 0 Hypo / Hyperglycemic Management (close monitor of Blood Glucose) []  - 0 Ankle / Brachial Index (ABI) - do not check if billed separately X- 1 5 Vital Signs Has the patient been seen at the hospital within the last three years: Yes Total Score: 85 Level Of Care: New/Established - Level 3 Electronic Signature(s) Signed: 06/15/2022 5:15:24 PM By: Baruch Gouty RN, BSN Entered By: Baruch Gouty on 06/15/2022 08:14:40 -------------------------------------------------------------------------------- Encounter Discharge Information Details Patient Name: Date of Service: Robert Brennan, Robert Brennan 06/15/2022 7:30 A M Medical Record Number: [id number] Patient Account Number: [id number] Date of Birth/Sex: Treating RN: Feb 23, 1971 (51 y.o. [name] Primary Care Payson Crumby: MA Durenda G[place], [place] TTHEW Other  Clinician: Referring Brileigh Sevcik: Treating [name]/Extender: Fredirick Maudlin MA Durenda Guthrie, [place] TTHEW Weeks in Treatment: 9 Encounter Discharge Information Items Discharge Condition: Stable Ambulatory Status: Ambulatory Discharge Destination: Home Transportation: Private Auto Accompanied By: self Schedule Follow-up Appointment: Yes Clinical Summary of Care: Patient Declined Electronic Signature(s) Signed: 06/15/2022 5:15:24 PM By: Baruch Gouty RN, BSN Entered By: Baruch Gouty on 06/15/2022 08:16:32 -------------------------------------------------------------------------------- Lower Extremity Assessment Details Patient Name: Date of Service: Robert Brennan, Robert Brennan 06/15/2022 7:30 A M Medical Record Number: [id number] Patient Account Number: [id number] Date of Birth/Sex: Treating RN: July 21, 1971 (51 y.o. [name] Primary Care Leevi Cullars: MA Durenda G[place], [place] TTHEW Other Clinician: Referring Shanelle Clontz: Treating Emmagene Ortner/Extender: Fredirick Maudlin MA Durenda G[place], [place] TTHE[place] Weeks in Treatment: 9 Edema Assessment Assessed: [Left: No] [Right: No] Edema: [Left: Ye] [Right: s] Calf Left: Right: Point of Measurement: From Medial Instep 37.5 cm Ankle Left: Right: Point of Measurement: From Medial Instep 24 cm Vascular Assessment Pulses: Dorsalis Pedis Palpable: [Right:Yes] Electronic Signature(s) Signed: 06/15/2022 5:15:24 PM By: Baruch Gouty RN, BSN Entered By: Baruch Gouty on 06/15/2022 07:59:15 -------------------------------------------------------------------------------- Multi Wound Chart Details Patient Name: Date of Service: Robert Brennan, Robert Brennan 06/15/2022 7:30 A M Medical Record Number: [id number] Patient Account Number: [id number] Date of Birth/Sex: Treating RN: 12-16-1970 (51 y.o. [name] Primary Care Annlee Glandon: MA Durenda G[place], [place] TTHEW Other Clinician: Referring Dajha Urquilla: Treating Aniela Caniglia/Extender: Fredirick Maudlin MA Durenda G[place], [place] TTHE[place] Weeks in Treatment: 9 Vital  Signs Height(in): 72 Pulse(bpm): 116 Weight(lbs): 280 Blood Pressure(mmHg): 139/94 Body Mass Index(BMI): 38 Temperature(F): 98.3 Respiratory Rate(breaths/min): 18 Photos: [N/A:N/A] Right, Distal, Lateral Lower Leg Right, Anterior Lower Leg N/A Wound Location: Trauma Trauma N/A Wounding Event: Diabetic Wound/Ulcer of the Lower Diabetic Wound/Ulcer of the Lower N/A Primary Etiology: Extremity Extremity Venous Leg Ulcer N/A N/A Secondary Etiology: Hypertension, Type II  Diabetes, Gout, Hypertension, Type II Diabetes, Gout, N/A Comorbid History: Neuropathy Neuropathy 09/26/2021 05/31/2022 N/A Date Acquired: 9 1 N/A Weeks of Treatment: Healed - Epithelialized Healed - Epithelialized N/A Wound Status: No No N/A Wound Recurrence: 0x0x0 0x0x0 N/A Measurements L x W x D (cm) 0 0 N/A A (cm) : rea 0 0 N/A Volume (cm) : 100.00% 100.00% N/A % Reduction in A rea: 100.00% 100.00% N/A % Reduction in Volume: Grade 1 Grade 1 N/A Classification: None Present None Present N/A Exudate A mount: None Present (0%) None Present (0%) N/A Granulation A mount: None Present (0%) None Present (0%) N/A Necrotic A mount: Fascia: No Fascia: No N/A Exposed Structures: Fat Layer (Subcutaneous Tissue): No Fat Layer (Subcutaneous Tissue): No Tendon: No Tendon: No Muscle: No Muscle: No Joint: No Joint: No Bone: No Bone: No Large (67-100%) Large (67-100%) N/A Epithelialization: Treatment Notes Electronic Signature(s) Signed: 06/15/2022 9:04:13 AM By: Fredirick Maudlin MD FACS Signed: 06/15/2022 5:15:24 PM By: Baruch Gouty RN, BSN Entered By: Fredirick Maudlin on 06/15/2022 09:04:13 -------------------------------------------------------------------------------- Multi-Disciplinary Care Plan Details Patient Name: Date of Service: Robert Brennan, Robert Brennan 06/15/2022 7:30 A M Medical Record Number: [id number] Patient Account Number: [id number] Date of Birth/Sex: Treating RN: [birth year] (51 y.o. [name] Primary Care Rey Dansby: MA Durenda Guth[place], [place] TTHEW Other Clinician: Referring Arnola Crittendon: Treating Georgeanna Radziewicz/Extender: Fredirick Maudlin MA Durenda [place], [place] TTHE[place] Weeks in Treatment: [place] reviewed with physician Active Inactive Electronic Signature(s) Signed: 06/15/2022 5:15:24 PM By: Baruch Gouty RN, BSN Entered By: Baruch Gouty on 06/15/2022 08:02:51 -------------------------------------------------------------------------------- Pain Assessment Details Patient Name: Date of Service: Robert Brennan, Robert Brennan 06/15/2022 7:30 A M Medical Record Number: [id number] Patient Account Number: [id number] Date of Birth/Sex: Treating RN: 1971-01-06 (51 y.o. [name] Primary Care Nayeliz Hipp: MA Durenda G[place], [place] TTHEW Other Clinician: Referring Jarod Bozzo: Treating Alexys Lobello/Extender: Fredirick Maudlin MA Durenda [place], [place] TTHE[place] Weeks in Treatment: 9 Active Problems Location of Pain Severity and Description of Pain Patient Has Paino No Site Locations Rate the pain. Current Pain Level: 0 Pain Management and Medication Current Pain Management: Electronic Signature(s) Signed: 06/15/2022 5:15:24 PM By: Baruch Gouty RN, BSN Entered By: Baruch Gouty on 06/15/2022 07:54:01 -------------------------------------------------------------------------------- Patient/Caregiver Education Details Patient Name: Date of Service: Robert Brennan, Robert Brennan 9/21/2023andnbsp7:30 A M Medical Record Number: [id number] Patient Account Number: [id number] Date of Birth/Gender: Treating RN: [birth year] (51 y.o. [name] Primary Care Physician: MA Durenda Guthrie, [place] TTHEW Other Clinician: Referring Physician: Treating Physician/Extender: Fredirick Maudlin MA Durenda G[place], [place] TTHE[place] Weeks in Treatment: 9 Education Assessment Education Provided To: Patient Education Topics Provided Venous: Methods: Explain/Verbal Responses: Reinforcements needed, State content correctly Electronic Signature(s) Signed: 06/15/2022  5:15:24 PM By: Baruch Gouty RN, BSN Entered By: Baruch Gouty on 06/15/2022 08:03:15 -------------------------------------------------------------------------------- Wound Assessment Details Patient Name: Date of Service: Robert Brennan, Robert Brennan 06/15/2022 7:30 A M Medical Record Number: [id number] Patient Account Number: [id number] Date of Birth/Sex: Treating RN: 15-Nov-1970 (51 y.o. [name] Primary Care Deniah Saia: MA Durenda G[place], [place] TTHEW Other Clinician: Referring Yamileth Hayse: Treating Jeziah Kretschmer/Extender: Fredirick Maudlin MA Durenda [place], [place] TTHE[place] Weeks in Treatment: 9 Wound Status Wound Number: 2 Primary Etiology: Diabetic Wound/Ulcer of the Lower Extremity Wound Location: Right, Distal, Lateral Lower Leg Secondary Etiology: Venous Leg Ulcer Wounding Event: Trauma Wound Status: Healed - Epithelialized Date Acquired: 09/26/2021 Comorbid History: Hypertension, Type II Diabetes, Gout, Neuropathy Weeks Of Treatment: 9 Clustered Wound: No Photos Wound Measurements Length: (cm) Width: (cm) Depth: (cm) Area: (cm) Volume: (cm) 0 % Reduction in Area: 100% 0 %  Reduction in Volume: 100% 0 Epithelialization: Large (67-100%) 0 Tunneling: No 0 Undermining: No Wound Description Classification: Grade 1 Exudate Amount: None Present Foul Odor After Cleansing: No Slough/Fibrino No Wound Bed Granulation Amount: None Present (0%) Exposed Structure Necrotic Amount: None Present (0%) Fascia Exposed: No Fat Layer (Subcutaneous Tissue) Exposed: No Tendon Exposed: No Muscle Exposed: No Joint Exposed: No Bone Exposed: No Electronic Signature(s) Signed: 06/15/2022 5:15:24 PM By: Baruch Gouty RN, BSN Entered By: Baruch Gouty on 06/15/2022 08:01:35 -------------------------------------------------------------------------------- Wound Assessment Details Patient Name: Date of Service: Robert Brennan, Robert Brennan 06/15/2022 7:30 A M Medical Record Number: [id number] Patient Account Number: [id number] Date of  Birth/Sex: Treating RN: October 05, 1970 (51 y.o. [name] Primary Care Oleva Koo: MA Durenda G[place], [place] TTHEW Other Clinician: Referring Ameshia Pewitt: Treating Azure Budnick/Extender: Fredirick Maudlin MA Durenda G[place], [place] TTHE[place] Weeks in Treatment: 9 Wound Status Wound Number: 4 Primary Etiology: Diabetic Wound/Ulcer of the Lower Extremity Wound Location: Right, Anterior Lower Leg Wound Status: Healed - Epithelialized Wounding Event: Trauma Comorbid History: Hypertension, Type II Diabetes, Gout, Neuropathy Date Acquired: 05/31/2022 Weeks Of Treatment: 1 Clustered Wound: No Photos Wound Measurements Length: (cm) Width: (cm) Depth: (cm) Area: (cm) Volume: (cm) 0 % Reduction in Area: 100% 0 % Reduction in Volume: 100% 0 Epithelialization: Large (67-100%) 0 Tunneling: No 0 Undermining: No Wound Description Classification: Grade 1 Exudate Amount: None Present Foul Odor After Cleansing: No Slough/Fibrino No Wound Bed Granulation Amount: None Present (0%) Exposed Structure Necrotic Amount: None Present (0%) Fascia Exposed: No Fat Layer (Subcutaneous Tissue) Exposed: No Tendon Exposed: No Muscle Exposed: No Joint Exposed: No Bone Exposed: No Electronic Signature(s) Signed: 06/15/2022 5:15:24 PM By: Baruch Gouty RN, BSN Entered By: Baruch Gouty on 06/15/2022 08:02:24 -------------------------------------------------------------------------------- [place] Details Patient Name: Date of Service: Robert Brennan, Robert Brennan 06/15/2022 7:30 A M Medical Record Number: [id number] Patient Account Number: [id number] Date of Birth/Sex: Treating RN: 13-Nov-1970 (51 y.o. [name] Primary Care Braylon Lemmons: MA Durenda Guth[place], [place] TTHEW Other Clinician: Referring Jaremy Nosal: Treating Tarini Carrier/Extender: Fredirick Maudlin MA Durenda [place], [place] TTHE[place] Weeks in Treatment: 9 Vital Signs Time Taken: 07:53 Temperature (F): 98.3 Height (in): 72 Pulse (bpm): 116 Weight (lbs): 280 Respiratory Rate (breaths/min): 18 Body Mass Index  (BMI): 38 Blood Pressure (mmHg): 139/94 Reference Range: 80 - 120 mg / dl Electronic Signature(s) Signed: 06/15/2022 5:15:24 PM By: Baruch Gouty RN, BSN Entered By: Baruch Gouty on 06/15/2022 07:53:54

## 2022-07-24 ENCOUNTER — Encounter (INDEPENDENT_AMBULATORY_CARE_PROVIDER_SITE_OTHER): Payer: Self-pay

## 2022-07-28 ENCOUNTER — Encounter (INDEPENDENT_AMBULATORY_CARE_PROVIDER_SITE_OTHER): Payer: Self-pay | Admitting: Nurse Practitioner

## 2022-07-28 ENCOUNTER — Ambulatory Visit (INDEPENDENT_AMBULATORY_CARE_PROVIDER_SITE_OTHER): Payer: BC Managed Care – PPO | Admitting: Nurse Practitioner

## 2022-07-28 VITALS — BP 133/80 | HR 90 | Resp 16 | Wt 279.0 lb

## 2022-07-28 DIAGNOSIS — Z794 Long term (current) use of insulin: Secondary | ICD-10-CM

## 2022-07-28 DIAGNOSIS — I10 Essential (primary) hypertension: Secondary | ICD-10-CM

## 2022-07-28 DIAGNOSIS — E119 Type 2 diabetes mellitus without complications: Secondary | ICD-10-CM

## 2022-07-28 DIAGNOSIS — I890 Lymphedema, not elsewhere classified: Secondary | ICD-10-CM | POA: Diagnosis not present

## 2022-08-07 ENCOUNTER — Encounter (INDEPENDENT_AMBULATORY_CARE_PROVIDER_SITE_OTHER): Payer: Self-pay | Admitting: Nurse Practitioner

## 2022-08-07 NOTE — Progress Notes (Signed)
Subjective:    Patient ID: Robert Brennan, male    DOB: Jan 24, 1971, 51 y.o.   MRN: [id number] Chief Complaint  Patient presents with   Follow-up    6 month follow up    The patient returns to the office for followup evaluation regarding leg swelling.  The swelling has improved quite a bit and the pain associated with swelling has decreased substantially. There have not been any interval development of a ulcerations or wounds.  Since the previous visit the patient has been wearing graduated compression stockings and has noted  improvement in the lymphedema. The patient has been using compression routinely morning until night.  The patient also states elevation during the day and exercise (such as walking) is being done too.       Review of Systems  Cardiovascular:  Positive for leg swelling.  All other systems reviewed and are negative.      Objective:   Physical Exam Vitals reviewed.  HENT:     Head: Normocephalic.  Cardiovascular:     Rate and Rhythm: Normal rate.     Pulses: Normal pulses.  Pulmonary:     Effort: Pulmonary effort is normal.  Musculoskeletal:     Right lower leg: Edema present.     Left lower leg: Edema present.  Skin:    General: Skin is warm and dry.  Neurological:     Mental Status: He is alert and oriented to person, place, and time.  Psychiatric:        Mood and Affect: Mood normal.        Behavior: Behavior normal.        Thought Content: Thought content normal.        Judgment: Judgment normal.     BP 133/80 (BP Location: Right Arm)   Pulse 90   Resp 16   Wt 279 lb (126.6 kg)   BMI 37.84 kg/m   Past Medical History:  Diagnosis Date   Azoospermia    Gout    per pt stable as of 03-24-2016   Hypertension    Hypogonadism, testicular    Type 2 diabetes mellitus (HCC)    Wears glasses     Social History   Socioeconomic History   Marital status: Married    Spouse name: Deanna Artis   Number of children: 1   Years of education: Not on  file   Highest education level: Not on file  Occupational History   Not on file  Tobacco Use   Smoking status: Never   Smokeless tobacco: Never  Vaping Use   Vaping Use: Never used  Substance and Sexual Activity   Alcohol use: No   Drug use: No   Sexual activity: Not on file  Other Topics Concern   Not on file  Social History Narrative   Lives with wife   Social Determinants of Health   Financial Resource Strain: Not on file  Food Insecurity: Not on file  Transportation Needs: Not on file  Physical Activity: Not on file  Stress: Not on file  Social Connections: Not on file  Intimate Partner Violence: Not on file    Past Surgical History:  Procedure Laterality Date   BREAST REDUCTION SURGERY  age 43 and 38   COLONOSCOPY  2011   GRAFT APPLICATION Right 12/15/2021   Procedure: GRAFT APPLICATION;  Surgeon: Sung Amabile, DO;  Location: [facility];  Service: General;  Laterality: Right;   SKIN SPLIT GRAFT Right 03/07/2022   Procedure: SKIN GRAFT  SPLIT THICKNESS;  Surgeon: Cindra Presume, MD;  Location: [place];  Service: Plastics;  Laterality: Right;   TESTICLE BIOPSY N/A 04/04/2016   Procedure: BIOPSY TESTICULAR;  Surgeon: Festus Aloe, MD;  Location: [facility];  Service: Urology;  Laterality: N/A;   WOUND DEBRIDEMENT Right 11/02/2021   Procedure: DEBRIDEMENT WOUND;  Surgeon: Benjamine Sprague, DO;  Location: [facility];  Service: General;  Laterality: Right;    Family History  Problem Relation Age of Onset   Arthritis Mother    Hypertension Mother    Cancer Father     No Known Allergies     Latest Ref Rng & Units 04/04/2016    8:04 AM 08/15/2015    1:33 PM  CBC  WBC 3.8 - 10.6 K/uL  6.3   Hemoglobin 13.0 - 17.0 g/dL 14.3  15.6   Hematocrit 39.0 - 52.0 % 42.0  46.1   Platelets 150 - 440 K/uL  191       CMP     Component Value Date/Time   NA 137 12/13/2021 1512   K 4.2 12/13/2021 1512   CL 103 12/13/2021 1512   CO2 28  12/13/2021 1512   GLUCOSE 127 (H) 12/13/2021 1512   BUN 14 12/13/2021 1512   CREATININE 1.16 12/13/2021 1512   CREATININE 1.08 06/25/2013 1111   CALCIUM 9.3 12/13/2021 1512   GFRNONAA >60 12/13/2021 1512   GFRAA >60 08/15/2015 1333     No results found.     Assessment & Plan:   1. Lymphedema Recommend:  No surgery or intervention at this point in time.    I have reviewed my previous discussion with the patient regarding swelling and why it  causes symptoms.  The patient is doing well with compression and will continue wearing graduated compression on a daily basis. The patient will  continue wearing the compression first thing in the morning and removing them in the evening. The patient is instructed specifically not to sleep in the compression.    In addition, behavioral modification including elevation during the day and exercise as tolerated will be continued.    Patient should follow-up on an annual basis   2. Essential hypertension Continue antihypertensive medications as already ordered, these medications have been reviewed and there are no changes at this time.  3. Type 2 diabetes mellitus treated with insulin ([place]) Continue hypoglycemic medications as already ordered, these medications have been reviewed and there are no changes at this time.  Hgb A1C to be monitored as already arranged by primary service   Current Outpatient Medications on File Prior to Visit  Medication Sig Dispense Refill   acetaminophen (TYLENOL) 500 MG tablet Take 500-1,000 mg by mouth every 6 (six) hours as needed (for pain.).     allopurinol (ZYLOPRIM) 100 MG tablet Take 200 mg by mouth in the morning.     amLODipine (NORVASC) 10 MG tablet Take 10 mg by mouth in the morning.     atorvastatin (LIPITOR) 10 MG tablet Take 10 mg by mouth at bedtime.     benazepril (LOTENSIN) 40 MG tablet Take 40 mg by mouth at bedtime.     ibuprofen (ADVIL) 800 MG tablet Take 1 tablet (800 mg total) by mouth  every 8 (eight) hours as needed for mild pain or moderate pain. 30 tablet 0   metFORMIN (GLUCOPHAGE) 1000 MG tablet Take 1,000 mg by mouth 2 (two) times daily.     ondansetron (ZOFRAN) 4 MG tablet Take 1  tablet (4 mg total) by mouth every 8 (eight) hours as needed for nausea or vomiting. 20 tablet 0   OZEMPIC, 1 MG/DOSE, 4 MG/3ML SOPN Inject 1 mg into the skin every Sunday.     potassium chloride (KLOR-CON) 10 MEQ tablet Take 20 mEq by mouth in the morning.     spironolactone (ALDACTONE) 25 MG tablet Take 25 mg by mouth 2 (two) times daily.     tadalafil (CIALIS) 5 MG tablet Take 5 mg by mouth daily as needed for erectile dysfunction.     TRESIBA FLEXTOUCH 200 UNIT/ML FlexTouch Pen Inject 54 Units into the skin in the morning and at bedtime.     traMADol (ULTRAM) 50 MG tablet Take 1 tablet (50 mg total) by mouth every 8 (eight) hours as needed. (Patient not taking: Reported on 07/28/2022) 10 tablet 0   No current facility-administered medications on file prior to visit.    There are no Patient Instructions on file for this visit. No follow-ups on file.   Kris Hartmann, NP

## 2023-01-01 ENCOUNTER — Ambulatory Visit
Admission: EM | Admit: 2023-01-01 | Discharge: 2023-01-01 | Disposition: A | Discharge status: Home / Self Care | Payer: BC Managed Care – PPO | Attending: Physician Assistant | Admitting: Physician Assistant

## 2023-01-01 ENCOUNTER — Ambulatory Visit (INDEPENDENT_AMBULATORY_CARE_PROVIDER_SITE_OTHER): Payer: BC Managed Care – PPO

## 2023-01-01 DIAGNOSIS — J209 Acute bronchitis, unspecified: Secondary | ICD-10-CM | POA: Diagnosis not present

## 2023-01-01 DIAGNOSIS — R051 Acute cough: Secondary | ICD-10-CM

## 2023-01-01 MED ORDER — PROMETHAZINE-DM 6.25-15 MG/5ML PO SYRP: 5.0000 mL | ORAL_SOLUTION | Freq: Four times a day (QID) | ORAL | 0 refills | Status: DC | PRN

## 2023-01-01 NOTE — ED Provider Notes (Signed)
[facility]    CSN: [id number] Arrival date & time: 01/01/23  1604      History   Chief Complaint Chief Complaint  Patient presents with   Cough    HPI Robert Brennan is a 52 y.o. male presenting for 5 day history of cough and congestion. Cough is productive of greenish sputum. No associated fever, fatigue, sinus pain, sore throat, ear pain, chest pain, shortness of breath.   Patient was treated for suspected sinusitis from 12/03/22-12/10/22 with Augmentin. Completed course and was asymptomatic x 1 week or so before symptoms returned. Denies any sinus pressure at this time. States is feels like the congestion has moved to his chest. Trying OTC meds without relief. Reports a lot of coughing fits and difficulty sleeping due to cough.  Medical history is significant for diabetes, gout and hypertension.  HPI  Past Medical History:  Diagnosis Date   Azoospermia    Gout    per pt stable as of 03-24-2016   Hypertension    Hypogonadism, testicular    Type 2 diabetes mellitus    Wears glasses     Patient Active Problem List   Diagnosis Date Noted   Chronic venous insufficiency 12/02/2021   Lymphedema 12/02/2021   Essential hypertension 12/02/2021   Hyperlipemia 12/02/2021   Hypokalemia 12/08/2012   Hypoglycemia 09/06/2012   Neuropathy, peripheral 05/02/2012   Gout 02/05/2012   Type 2 diabetes mellitus treated with insulin 05/01/2011    Past Surgical History:  Procedure Laterality Date   BREAST REDUCTION SURGERY  age 52 and 8913   COLONOSCOPY  2011   GRAFT APPLICATION Right 12/15/2021   Procedure: GRAFT APPLICATION;  Surgeon: Sung AmabileSakai, Isami, DO;  Location: [facility];  Service: General;  Laterality: Right;   SKIN SPLIT GRAFT Right 03/07/2022   Procedure: SKIN GRAFT SPLIT THICKNESS;  Surgeon: Allena NapoleonPace, Collier S, MD;  Location: [facility];  Service: Plastics;  Laterality: Right;   TESTICLE BIOPSY N/A 04/04/2016   Procedure: BIOPSY TESTICULAR;  Surgeon: Jerilee FieldMatthew  Eskridge, MD;  [facility];  Service: Urology;  Laterality: N/A;   WOUND DEBRIDEMENT Right 11/02/2021   Procedure: DEBRIDEMENT WOUND;  Surgeon: Sung AmabileSakai, Isami, DO;  Location: [facility];  Service: General;  Laterality: Right;       Home Medications    Prior to Admission medications   Medication Sig Start Date End Date Taking? Authorizing Provider  promethazine-dextromethorphan (PROMETHAZINE-DM) 6.25-15 MG/5ML syrup Take 5 mLs by mouth 4 (four) times daily as needed. 01/01/23  Yes Shirlee LatchEaves, Nami Strawder B, PA-C  acetaminophen (TYLENOL) 500 MG tablet Take 500-1,000 mg by mouth every 6 (six) hours as needed (for pain.).    [provider]  allopurinol (ZYLOPRIM) 100 MG tablet Take 200 mg by mouth in the morning. 09/06/21   [provider]  amLODipine (NORVASC) 10 MG tablet Take 10 mg by mouth in the morning. 09/06/21   [provider]  atorvastatin (LIPITOR) 10 MG tablet Take 10 mg by mouth at bedtime. 10/23/21   [provider]  benazepril (LOTENSIN) 40 MG tablet Take 40 mg by mouth at bedtime. 09/06/21   [provider]  ibuprofen (ADVIL) 800 MG tablet Take 1 tablet (800 mg total) by mouth every 8 (eight) hours as needed for mild pain or moderate pain. 11/02/21   Tonna BoehringerSakai, Isami, DO  metFORMIN (GLUCOPHAGE) 1000 MG tablet Take 1,000 mg by mouth 2 (two) times daily. 08/24/21   [provider]  ondansetron (ZOFRAN) 4 MG tablet Take  1 tablet (4 mg total) by mouth every 8 (eight) hours as needed for nausea or vomiting. 03/07/22   Scheeler, Kermit Balo, PA-C  OZEMPIC, 1 MG/DOSE, 4 MG/3ML SOPN Inject 1 mg into the skin every Sunday. 10/19/21   [provider]  potassium chloride (KLOR-CON) 10 MEQ tablet Take 20 mEq by mouth in the morning. 09/04/21   [provider]  spironolactone (ALDACTONE) 25 MG tablet Take 25 mg by mouth 2 (two) times daily. 09/28/21   [provider]  tadalafil (CIALIS) 5 MG tablet Take 5 mg by mouth  daily as needed for erectile dysfunction.    [provider]  TRESIBA FLEXTOUCH 200 UNIT/ML FlexTouch Pen Inject 54 Units into the skin in the morning and at bedtime. 10/27/21   [provider]    Family History Family History  Problem Relation Age of Onset   Arthritis Mother    Hypertension Mother    Cancer Father     Social History Social History   Tobacco Use   Smoking status: Never   Smokeless tobacco: Never  Vaping Use   Vaping Use: Never used  Substance Use Topics   Alcohol use: No   Drug use: No     Allergies   Patient has no known allergies.   Review of Systems Review of Systems  Constitutional:  Negative for fatigue and fever.  HENT:  Positive for congestion and rhinorrhea. Negative for sinus pressure, sinus pain and sore throat.   Respiratory:  Positive for cough. Negative for shortness of breath.   Cardiovascular:  Negative for chest pain.  Gastrointestinal:  Negative for abdominal pain, diarrhea, nausea and vomiting.  Musculoskeletal:  Negative for myalgias.  Neurological:  Negative for weakness, light-headedness and headaches.  Hematological:  Negative for adenopathy.  Psychiatric/Behavioral:  Positive for sleep disturbance.      Physical Exam Triage Vital Signs ED Triage Vitals  Enc Vitals Group     BP      Pulse      Resp      Temp      Temp src      SpO2      Weight      Height      Head Circumference      Peak Flow      Pain Score      Pain Loc      Pain Edu?      Excl. in GC?    No data found.  Updated Vital Signs BP (!) 147/91 (BP Location: Right Arm)   Pulse (!) 104   Temp 98.9 F (37.2 C) (Oral)   Resp 20   SpO2 95%      Physical Exam Vitals and nursing note reviewed.  Constitutional:      General: He is not in acute distress.    Appearance: Normal appearance. He is well-developed.  HENT:     Head: Normocephalic and atraumatic.     Right Ear: Tympanic membrane, ear canal and external ear normal.      Left Ear: Tympanic membrane, ear canal and external ear normal.     Nose: Congestion present.     Mouth/Throat:     Mouth: Mucous membranes are moist.     Pharynx: Oropharynx is clear.  Eyes:     General: No scleral icterus.    Conjunctiva/sclera: Conjunctivae normal.  Cardiovascular:     Rate and Rhythm: Regular rhythm. Tachycardia present.     Heart sounds: Normal heart sounds.  Pulmonary:     Effort: Pulmonary effort is normal. No respiratory distress.     Breath sounds: Normal breath sounds. No wheezing, rhonchi or rales.  Musculoskeletal:     Cervical back: Neck supple.  Skin:    General: Skin is warm and dry.     Capillary Refill: Capillary refill takes less than 2 seconds.  Neurological:     General: No focal deficit present.     Mental Status: He is alert. Mental status is at baseline.     Motor: No weakness.     Gait: Gait normal.  Psychiatric:        Mood and Affect: Mood normal.        Behavior: Behavior normal.      UC Treatments / Results  Labs (all labs ordered are listed, but only abnormal results are displayed) Labs Reviewed - No data to display  EKG   Radiology DG Chest 2 View  Result Date: 01/01/2023 CLINICAL DATA:  Intermittent cough for 1 month. EXAM: CHEST - 2 VIEW COMPARISON:  None Available. FINDINGS: The heart size and mediastinal contours are within normal limits. Both lungs are clear. The visualized skeletal structures are unremarkable. IMPRESSION: No active cardiopulmonary disease. Electronically Signed   By: Annia Belt M.D.   On: 01/01/2023 17:22    Procedures Procedures (including critical care time)  Medications Ordered in UC Medications - No data to display  Initial Impression / Assessment and Plan / UC Course  I have reviewed the triage vital signs and the nursing notes.  Pertinent labs & imaging results that were available during my care of the patient were reviewed by me and considered in my medical decision making (see chart for  details).   52 y/o male presents for 5 day history of cough and congestion. Reports that he was treated with Augmentin for a sinus infection last mont, felt better for a week and now has chest congestion. No fever or SOB.  BP is 147/91 and pulse elevated at 104 bpm. He is afebrile and overall well appearing. Has mild nasal congestion. Throat is clear. Chest is clear to auscultation and heart regular rhythm.  Chest x-ray ordered to assess for the possibility of pneumonia.  X-ray normal.  Discussed results with patient.  Viral bronchitis suspected.  Supportive care encouraged.  Sent Promethazine DM to pharmacy.  Reviewed return precautions.  Work note given.   Final Clinical Impressions(s) / UC Diagnoses   Final diagnoses:  Acute bronchitis, unspecified organism  Acute cough     Discharge Instructions      -Your x-ray does not show any evidence of pneumonia.  You have viral bronchitis which can last for couple weeks.  It can last longer than a typical cold. - I sent cough medicine to pharmacy.  You should increase your rest and fluids. - You should return if you develop fever or have shortness of breath or are not feeling better after 2 weeks.     ED Prescriptions     Medication Sig Dispense Auth. Provider   promethazine-dextromethorphan (PROMETHAZINE-DM) 6.25-15 MG/5ML syrup Take 5 mLs by mouth 4 (four) times daily as needed. 118 mL Shirlee Latch, PA-C      PDMP not reviewed this encounter.   Shirlee Latch, PA-C 01/01/23 1727

## 2023-01-01 NOTE — ED Triage Notes (Signed)
Patient with c/o cough and congestion for about 5 days. Patient has been using flonase with no relief.

## 2023-01-01 NOTE — Discharge Instructions (Addendum)
-  Your x-ray does not show any evidence of pneumonia.  You have viral bronchitis which can last for couple weeks.  It can last longer than a typical cold. - I sent cough medicine to pharmacy.  You should increase your rest and fluids. - You should return if you develop fever or have shortness of breath or are not feeling better after 2 weeks.

## 2023-04-27 NOTE — Progress Notes (Signed)
 Podiatry Visit    History of Present Illness: Robert Brennan is a 52 y.o. male who is being seen as a new patient today to establish at risk foot care. Pmhx significant for type 2 DM, gout, HTN.  Patient admits to burning and tingling.  Patient states that nails have become long and painful. Patient denies any other pedal issues.    Past Medical History: Past Medical History:  Diagnosis Date  . Diabetes mellitus type 2, uncomplicated (CMS/HHS-HCC)   . Gout   . Hypertension   . Hypokalemia 12/08/2012  . Morbid obesity with BMI of 40.0-44.9, adult (CMS/HHS-HCC)   . Skin graft hematoma 03/07/2022   right leg    Past Surgical History: Past Surgical History:  Procedure Laterality Date  . COLONOSCOPY W/REMOVAL LESIONS BY SNARE N/A 07/15/2019   Procedure: COLORECTAL CANCER SCREENING;  Surgeon: Gaile Claretta Denmark, MD;  Location: [facility];  Service: Gastroenterology;  Laterality: N/A;  . wound debridement Right 11/02/2021   Dr Henriette Pierre  . COLONOSCOPY    . sperm retrieval      Past Family History: Family History  Problem Relation Age of Onset  . High blood pressure (Hypertension) Mother   . Prostate cancer Father   . Lymphoma Father   . Lung cancer Father   . Colon cancer Paternal Grandmother   . Colon polyps Neg Hx   . Anesthesia problems Neg Hx     Social Hx: Social History   Socioeconomic History  . Marital status: Married  Tobacco Use  . Smoking status: Never  . Smokeless tobacco: Never  Vaping Use  . Vaping status: Never Used  Substance and Sexual Activity  . Alcohol use: Yes    Alcohol/week: 0.0 standard drinks of alcohol    Comment: rarely  . Drug use: No  . Sexual activity: Yes    Partners: Female    Comment: MARRIED  Social History Marketing Executive.   Social Determinants of Health   Financial Resource Strain: Low Risk  (03/27/2023)   Overall Financial Resource Strain (CARDIA)   . Difficulty of Paying Living Expenses: Not very  hard  Food Insecurity: Food Insecurity Present (03/27/2023)   Hunger Vital Sign   . Worried About Programme Researcher, Broadcasting/film/video in the Last Year: Sometimes true   . Ran Out of Food in the Last Year: Sometimes true  Transportation Needs: No Transportation Needs (03/27/2023)   PRAPARE - Transportation   . Lack of Transportation (Medical): No   . Lack of Transportation (Non-Medical): No  Physical Activity: Sufficiently Active (04/30/2020)   Exercise Vital Sign   . Days of Exercise per Week: 5 days   . Minutes of Exercise per Session: 60 min  Stress: No Stress Concern Present (04/30/2020)   [employer] of Occupational Health - Occupational Stress Questionnaire   . Feeling of Stress : Not at all  Housing Stability: Unknown (03/27/2023)   Housing Stability Vital Sign   . Unable to Pay for Housing in the Last Year: No   . Homeless in the Last Year: No    Medications: Current Outpatient Medications  Medication Sig Dispense Refill  . allopurinoL  (ZYLOPRIM ) 100 MG tablet Take 2 tablets (200 mg total) by mouth once daily 60 tablet 5  . amLODIPine  (NORVASC ) 10 MG tablet Take 1 tablet (10 mg total) by mouth once daily 90 tablet 1  . atorvastatin  (LIPITOR) 10 MG tablet Take 1 tablet (10 mg total) by mouth at bedtime 90 tablet 3  .  benazepriL (LOTENSIN) 40 MG tablet Take 1 tablet (40 mg total) by mouth at bedtime 90 tablet 1  . blood glucose diagnostic (CONTOUR TEST STRIPS) test strip 3 (three) times daily Use as instructed. 100 each 11  . blood glucose diagnostic (ONETOUCH VERIO TEST STRIPS) test strip 1 each (1 strip total) 3 (three) times daily 100 each 11  . blood-glucose meter (CONTOUR NEXT EZ METER) Misc 1 each as directed . Contour next 1 each 1  . CLOMID 50 mg tablet Take 25 mg by mouth once daily    . doxazosin  (CARDURA ) 2 MG tablet Take 1 tablet (2 mg total) by mouth at bedtime 30 tablet 11  . empagliflozin  (JARDIANCE ) 25 mg tablet Take 1 tablet (25 mg total) by mouth once daily 90 tablet 3  .  lancets (ONETOUCH DELICA PLUS LANCET) Use 1 each 3 (three) times daily 100 each 11  . meloxicam  (MOBIC ) 7.5 MG tablet Take by mouth    . metFORMIN (GLUCOPHAGE) 1000 MG tablet TAKE 1 TABLET BY MOUTH TWICE DAILY WITH MEALS 180 tablet 3  . metoprolol  succinate (TOPROL -XL) 100 MG XL tablet Take 1 tablet (100 mg total) by mouth at bedtime 90 tablet 1  . ondansetron  (ZOFRAN ) 4 MG tablet Take 4 mg by mouth every 8 (eight) hours as needed    . oxyCODONE  (ROXICODONE ) 5 MG immediate release tablet TAKE 1 TABLET BY MOUTH EVERY 6 HOURS AS NEEDED FOR SEVERE PAIN FOR UP TO FIVE DAYS    . promethazine -dextromethorphan (PROMETHAZINE -DM) 6.25-15 mg/5 mL syrup Take 5 mLs by mouth every 6 (six) hours as needed 120 mL 0  . promethazine -dextromethorphan (PROMETHAZINE -DM) 6.25-15 mg/5 mL syrup Take by mouth    . semaglutide (OZEMPIC) 0.25 mg or 0.5 mg (2 mg/3 mL) pen injector Inject 0.75 mLs (0.5 mg total) subcutaneously once a week 3 mL 5  . tadalafiL (CIALIS) 20 MG tablet Take 20 mg by mouth once daily as needed    . traMADoL  (ULTRAM ) 50 mg tablet Take 50 mg by mouth every 8 (eight) hours as needed    . TRESIBA FLEXTOUCH U-200 pen injector (concentration 200 units/mL) Inject 60 Units subcutaneously at bedtime 30 mL 3  . blood-glucose meter (ONETOUCH VERIO FLEX METER) Misc 1 each as directed 1 each 1  . CIALIS 5 mg tablet Take 1 tablet by mouth once daily (Patient not taking: Reported on 12/03/2022) 10 tablet 7  . fluticasone propionate (FLONASE) 50 mcg/actuation nasal spray Place 1 spray into both nostrils 2 (two) times daily (Patient not taking: Reported on 02/06/2023) 16 g 0  . lancets Use 1 each 3 (three) times daily Use as instructed. 100 each 12   No current facility-administered medications for this visit.    Allergies: Allergies  Allergen Reactions  . Thiazides Other (See Comments)    Low K     Physical Exam:  Ht 182.9 cm (6')   Wt (!) 126.1 kg (278 lb)   BMI 37.70 kg/m     General/Constitutional: No apparent distress: well-nourished and well developed. Neurological:  Oriented to person, place, and time    VASCULAR EXAM: DP pulses palpable. PT pulses palpable.  CFT Within normal limits, <2 seconds. Hair growth diminished. Skin temp normal.    NEUROLOGIC EXAM: Light touch sensation and epicritic sensation are diminished   MUSCULOSKELETAL EXAM: No pain is elicited with palpation throughout the foot or joints. Range of motion through all pedal joints is normal. No crepitus. No masses noted. Muscle tone in within normal limits.  Muscle strength +5/5 to all muscle groups bilateral.     DERMATOLOGIC: All nails are thickened dystrophic and elongated.  No open lesions are noted. Webspaces are clean and clear. Hyperkeratosis not present.     Lab Results  Component Value Date   HGBA1C 5.9 02/06/2023   HGBA1C 6.4 (H) 11/07/2022     ASSESSMENT   ICD-10-CM  1. Type 2 diabetes mellitus with diabetic polyneuropathy, without long-term current use of insulin  (CMS/HHS-HCC)  E11.42  2. 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 -Comprehensive at risk foot examination was performed today.  Recommend regular foot checks, good pedal hygiene, proper fitting shoes and avoiding walking barefoot.  -All dystrophic nails were manually and mechanically debrided in length and thickness today without incident.   -Follow-up in 3 months for at risk foot care.     Penne Debby Ruth, DPM

## 2023-07-30 ENCOUNTER — Encounter (INDEPENDENT_AMBULATORY_CARE_PROVIDER_SITE_OTHER): Payer: Self-pay | Admitting: Vascular Surgery

## 2023-07-30 ENCOUNTER — Ambulatory Visit (INDEPENDENT_AMBULATORY_CARE_PROVIDER_SITE_OTHER): Payer: BC Managed Care – PPO | Admitting: Vascular Surgery

## 2023-07-30 VITALS — BP 149/87 | HR 92 | Resp 18 | Ht 72.0 in | Wt 273.0 lb

## 2023-07-30 DIAGNOSIS — I890 Lymphedema, not elsewhere classified: Secondary | ICD-10-CM

## 2023-07-30 DIAGNOSIS — E782 Mixed hyperlipidemia: Secondary | ICD-10-CM

## 2023-07-30 DIAGNOSIS — I872 Venous insufficiency (chronic) (peripheral): Secondary | ICD-10-CM | POA: Diagnosis not present

## 2023-07-30 DIAGNOSIS — I10 Essential (primary) hypertension: Secondary | ICD-10-CM

## 2023-07-30 DIAGNOSIS — E119 Type 2 diabetes mellitus without complications: Secondary | ICD-10-CM

## 2023-07-30 DIAGNOSIS — Z794 Long term (current) use of insulin: Secondary | ICD-10-CM

## 2023-07-30 NOTE — Progress Notes (Signed)
MRN : [id number]  Robert Brennan is a 52 y.o. (Apr 03, 1971) male who presents with chief complaint of legs swell.  History of Present Illness:   The patient returns to the office for followup evaluation regarding leg swelling.  The swelling has improved quite a bit and the pain associated with swelling has decreased substantially. There have not been any interval development of a ulcerations or wounds.  Since the previous visit the patient has been wearing graduated compression stockings and has noted some improvement in the lymphedema. The patient has been using compression routinely morning until night.  He has also been using his pump on a routine basis.  He finds it to be very helpful.  The patient also states elevation during the day and exercise (such as walking) is being done too.  Previous duplex ultrasound of the venous system performed today demonstrates normal deep venous system bilaterally. Superficial reflux is not identified bilaterally.    Current Meds  Medication Sig   acetaminophen (TYLENOL) 500 MG tablet Take 500-1,000 mg by mouth every 6 (six) hours as needed (for pain.).   allopurinol (ZYLOPRIM) 100 MG tablet Take 200 mg by mouth in the morning.   amLODipine (NORVASC) 10 MG tablet Take 10 mg by mouth in the morning.   atorvastatin (LIPITOR) 10 MG tablet Take 10 mg by mouth at bedtime.   benazepril (LOTENSIN) 40 MG tablet Take 40 mg by mouth at bedtime.   empagliflozin (JARDIANCE) 25 MG TABS tablet Take 1 tablet by mouth daily.   ibuprofen (ADVIL) 800 MG tablet Take 1 tablet (800 mg total) by mouth every 8 (eight) hours as needed for mild pain or moderate pain.   metFORMIN (GLUCOPHAGE) 1000 MG tablet Take 1,000 mg by mouth 2 (two) times daily.   ondansetron (ZOFRAN) 4 MG tablet Take 1 tablet (4 mg total) by mouth every 8 (eight) hours as needed for nausea or vomiting.   OZEMPIC, 1 MG/DOSE, 4 MG/3ML SOPN Inject 1 mg into the skin every Sunday.    potassium chloride (KLOR-CON) 10 MEQ tablet Take 20 mEq by mouth in the morning.   promethazine-dextromethorphan (PROMETHAZINE-DM) 6.25-15 MG/5ML syrup Take 5 mLs by mouth 4 (four) times daily as needed.   spironolactone (ALDACTONE) 25 MG tablet Take 25 mg by mouth 2 (two) times daily.   tadalafil (CIALIS) 5 MG tablet Take 5 mg by mouth daily as needed for erectile dysfunction.   TRESIBA FLEXTOUCH 200 UNIT/ML FlexTouch Pen Inject 54 Units into the skin in the morning and at bedtime.    Past Medical History:  Diagnosis Date   Azoospermia    Gout    per pt stable as of 03-24-2016   Hypertension    Hypogonadism, testicular    Type 2 diabetes mellitus (HCC)    Wears glasses     Past Surgical History:  Procedure Laterality Date   BREAST REDUCTION SURGERY  age 11 and 64   COLONOSCOPY  2011   GRAFT APPLICATION Right 12/15/2021   Procedure: GRAFT APPLICATION;  Surgeon: Sung Amabile, DO;  Location: [facility];  Service: General;  Laterality: Right;   SKIN SPLIT GRAFT Right 03/07/2022   Procedure: SKIN GRAFT SPLIT THICKNESS;  Surgeon: Allena Napoleon, MD;  [facility];  Service: Plastics;  Laterality: Right;   TESTICLE BIOPSY N/A 04/04/2016   Procedure:  BIOPSY TESTICULAR;  Surgeon: Jerilee Field, MD;  Location: [facility];  Service: Urology;  Laterality: N/A;   WOUND DEBRIDEMENT Right 11/02/2021   Procedure: DEBRIDEMENT WOUND;  Surgeon: Sung Amabile, DO;  Location: [facility];  Service: General;  Laterality: Right;    Social History Social History   Tobacco Use   Smoking status: Never   Smokeless tobacco: Never  Vaping Use   Vaping status: Never Used  Substance Use Topics   Alcohol use: No   Drug use: No    Family History Family History  Problem Relation Age of Onset   Arthritis Mother    Hypertension Mother    Cancer Father     No Known Allergies   REVIEW OF SYSTEMS (Negative unless checked)  Constitutional: [] Weight loss  [] Fever   [] Chills Cardiac: [] Chest pain   [] Chest pressure   [] Palpitations   [] Shortness of breath when laying flat   [] Shortness of breath with exertion. Vascular:  [] Pain in legs with walking   [x] Pain in legs with standing  [] History of DVT   [] Phlebitis   [x] Swelling in legs   [] Varicose veins   [] Non-healing ulcers Pulmonary:   [] Uses home oxygen   [] Productive cough   [] Hemoptysis   [] Wheeze  [] COPD   [] Asthma Neurologic:  [] Dizziness   [] Seizures   [] History of stroke   [] History of TIA  [] Aphasia   [] Vissual changes   [] Weakness or numbness in arm   [] Weakness or numbness in leg Musculoskeletal:   [] Joint swelling   [] Joint pain   [] Low back pain Hematologic:  [] Easy bruising  [] Easy bleeding   [] Hypercoagulable state   [] Anemic Gastrointestinal:  [] Diarrhea   [] Vomiting  [] Gastroesophageal reflux/heartburn   [] Difficulty swallowing. Genitourinary:  [] Chronic kidney disease   [] Difficult urination  [] Frequent urination   [] Blood in urine Skin:  [] Rashes   [] Ulcers  Psychological:  [] History of anxiety   []  History of major depression.  Physical Examination  Vitals:   07/30/23 0835 07/30/23 0836  BP: (!) 152/93 (!) 149/87  Pulse: (!) 101 92  Resp: 18 18  Weight: 273 lb (123.8 kg)   Height: 6' (1.829 m)    Body mass index is 37.03 kg/m. Gen: WD/WN, NAD Head: [place]/AT, No temporalis wasting.  Ear/Nose/Throat: Hearing grossly intact, nares w/o erythema or drainage, pinna without lesions Eyes: PER, EOMI, sclera nonicteric.  Neck: Supple, no gross masses.  No JVD.  Pulmonary:  Good air movement, no audible wheezing, no use of accessory muscles.  Cardiac: RRR, precordium not hyperdynamic. Vascular:  scattered varicosities present bilaterally.  Mild venous stasis changes to the legs bilaterally.  1+ soft pitting edema, CEAP [id number]  Vessel Right Left  Radial Palpable Palpable  Gastrointestinal: soft, non-distended. No guarding/no peritoneal signs.  Musculoskeletal: M/S 5/5 throughout.  No  deformity.  Neurologic: CN 2-12 intact. Pain and light touch intact in extremities.  Symmetrical.  Speech is fluent. Motor exam as listed above. Psychiatric: Judgment intact, Mood & affect appropriate for pt's clinical situation. Dermatologic: Venous rashes no ulcers noted.  No changes consistent with cellulitis. Lymph : No lichenification or skin changes of chronic lymphedema.  CBC Lab Results  Component Value Date   WBC 6.3 08/15/2015   HGB 14.3 04/04/2016   HCT 42.0 04/04/2016   MCV 85.9 08/15/2015   PLT 191 08/15/2015    BMET    Component Value Date/Time   NA 137 12/13/2021 1512   K 4.2 12/13/2021 1512   CL 103 12/13/2021 1512  CO2 28 12/13/2021 1512   GLUCOSE 127 (H) 12/13/2021 1512   BUN 14 12/13/2021 1512   CREATININE 1.16 12/13/2021 1512   CREATININE 1.08 06/25/2013 1111   CALCIUM 9.3 12/13/2021 1512   GFRNONAA >60 12/13/2021 1512   GFRAA >60 08/15/2015 1333   CrCl cannot be calculated (Patient's most recent lab result is older than the maximum 21 days allowed.).  COAG No results found for: "INR", "PROTIME"  Radiology No results found.   Assessment/Plan 1. Lymphedema Recommend:  No surgery or intervention at this point in time.    I have reviewed my discussion with the patient regarding lymphedema and why it  causes symptoms.  Patient will continue wearing graduated compression on a daily basis. The patient should put the compression on first thing in the morning and removing them in the evening. The patient should not sleep in the compression.   In addition, behavioral modification throughout the day will be continued.  This will include frequent elevation (such as in a recliner), use of over the counter pain medications as needed and exercise such as walking.  The systemic causes for chronic edema such as liver, kidney and cardiac etiologies does not appear to have significant changed over the past year.    The patient will continue aggressive use of the   lymph pump.  This will continue to improve the edema control and prevent sequela such as ulcers and infections.   The patient will follow-up with me on an annual basis.   2. Chronic venous insufficiency Recommend:  No surgery or intervention at this point in time.    I have reviewed my discussion with the patient regarding lymphedema and why it  causes symptoms.  Patient will continue wearing graduated compression on a daily basis. The patient should put the compression on first thing in the morning and removing them in the evening. The patient should not sleep in the compression.   In addition, behavioral modification throughout the day will be continued.  This will include frequent elevation (such as in a recliner), use of over the counter pain medications as needed and exercise such as walking.  The systemic causes for chronic edema such as liver, kidney and cardiac etiologies does not appear to have significant changed over the past year.    The patient will continue aggressive use of the  lymph pump.  This will continue to improve the edema control and prevent sequela such as ulcers and infections.   The patient will follow-up with me on an annual basis.   3. Essential hypertension Continue antihypertensive medications as already ordered, these medications have been reviewed and there are no changes at this time.  4. Mixed hyperlipidemia Continue statin as ordered and reviewed, no changes at this time  5. Type 2 diabetes mellitus treated with insulin (HCC) Continue hypoglycemic medications as already ordered, these medications have been reviewed and there are no changes at this time.  Hgb A1C to be monitored as already arranged by primary service    Levora Dredge, MD  07/30/2023 8:43 AM

## 2023-11-07 NOTE — Progress Notes (Addendum)
 Podiatry Visit   History of Present Illness: Robert Brennan is a 53 y.o. male who is being seen today for foot care. Patient states that their nails have become long and painful. Patient denies any changes to their medical history.    Physical Exam: There were no vitals taken for this visit.   General/Constitutional: No apparent distress: well-nourished and well developed.  VASCULAR EXAM: DP pulses palpable. PT pulses palpable.  CFT Within normal limits, <2 seconds. Hair growth diminished. Skin temp normal.    NEUROLOGIC EXAM: Light touch sensation and epicritic sensation are diminished.   MUSCULOSKELETAL EXAM: No pain is elicited with palpation throughout the foot or joints. Range of motion through all pedal joints is normal. No crepitus. No masses noted. Muscle tone in within normal limits. Muscle strength +5/5 to all muscle groups bilateral.     DERMATOLOGIC: All nails are thickened dystrophic and elongated. R hallux nail noted to be loose and detached distally. No active bleeding or signs of infection. No open lesions are noted. Webspaces are clean and clear. Hyperkeratosis not present.   Lab Results  Component Value Date   HGBA1C 6.5 (H) 06/12/2023   HGBA1C 5.9 02/06/2023     ASSESSMENT   ICD-10-CM  1. 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 Type 2 diabetes mellitus with diabetic polyneuropathy, without long-term current use of insulin  (CMS/HHS-HCC)  E11.42      PLAN -Comprehensive at risk foot examination was performed today.  Recommend regular foot checks, good pedal hygiene, proper fitting shoes and avoiding walking barefoot.  -All dystrophic nails were manually and mechanically debrided in length and thickness today without incident. 90% of the R great toenail was removed due to its looseness to avoid future injury/infection.    -Follow up in 3 months for foot care.   Penne Debby Ruth, DPM

## 2024-02-12 ENCOUNTER — Encounter (INDEPENDENT_AMBULATORY_CARE_PROVIDER_SITE_OTHER): Payer: Self-pay

## 2024-02-27 NOTE — Progress Notes (Signed)
 Podiatry Visit   History of Present Illness: Robert Brennan is a 53 y.o. male who is being seen today for foot care. Patient states that their nails have become long and painful. Patient denies any changes to their medical history.    Physical Exam: There were no vitals taken for this visit.   General/Constitutional: No apparent distress: well-nourished and well developed.  VASCULAR EXAM: DP pulses palpable. PT pulses palpable.  CFT Within normal limits, <2 seconds. Hair growth diminished. Skin temp normal.    NEUROLOGIC EXAM: Light touch sensation and epicritic sensation are diminished.   MUSCULOSKELETAL EXAM: No pain is elicited with palpation throughout the foot or joints. Range of motion through all pedal joints is normal. No crepitus. No masses noted. Muscle tone in within normal limits. Muscle strength +5/5 to all muscle groups bilateral.     DERMATOLOGIC: All nails are thickened dystrophic and elongated. R hallux nail noted to be loose and detached distally. No active bleeding or signs of infection. No open lesions are noted. Webspaces are clean and clear. Hyperkeratosis not present.   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 Type 2 diabetes mellitus with diabetic polyneuropathy, without long-term current use of insulin  (CMS/HHS-HCC)  E11.42       PLAN -Comprehensive at risk foot examination was performed today.  Recommend regular foot checks, good pedal hygiene, proper fitting shoes and avoiding walking barefoot.  -All dystrophic nails were manually and mechanically debrided in length and thickness today without incident.    -Follow up in 3 months for self pay foot care.   Penne Debby Ruth, DPM

## 2024-04-16 NOTE — Progress Notes (Signed)
 This video encounter was conducted with the patient's (or proxy's) verbal consent via secure, interactive audio and video telecommunications while in clinic/office/hospital.  The patient (or proxy) was instructed to have this encounter in a suitably private space and to only have persons present to whom they give permission to participate. In addition, patient identity was confirmed by use of name plus an additional identifier.  09/16/20 E&M) This visit was coded based on time. I spent a total of 35 minutes in both face-to-face and non-face-to-face activities for this visit on the date of this encounter.   Subjective: -Robert Brennan is a 53 y.o. male who was seen today for a f/u from his last visit with me in 01/2024. He was first evaluated by me in 11/2018 in consultation from Leita Agee, NP for hypertension. No f/u from 11/2018 to 02/2022 when he reestablished. Records in EMR reviewed since last visit. Please see EMR for actual lab values.   1 Hypertension, resistant   Diagnosed with hypertension in 2002.   2011 CT scan normal adrenals His HTN required escalation of medications/regimen to control. By Sep 16, 2013 he was on 4 medications amlodipine  10 mg, benazepril 40 mg, atenolol 100 mg, and chlorthalidone 25 mg daily. Had hypokalemia so he was started on kcl supplements and dose had to be increased because of continued hypokalemia.  10/2017 Reduced atenolol-chlorthalidone into half with same dose of kcl supplements because of hypokalemia 04/2018 K 3.3 on Chlorthalidone 12.5 mg and 3 daily of 20 meq kcl so switched Chlorthalidone to spironolactone and stopped kcl supplements. 07/2018 K 3.0, so restarted Kcl supplements and increase dose to 40 meq in 10/2018. 10/2018 K 2.9, aldo 5.7, PRA 5.5, plasma meta normal on metoprolol  100 mg daily, Aldactone 25 mg, amlodipine  10 mg, benazepril 40 mg and kcl 20 meq bid. 10/2018 1 mg DST cortisol 0.6, ACTH 3 low, Dexa 231, K 3.9 11/2018 First visit with me which was telehealth  because of COVID. He says his BP is under good control except when he gets under stress or if he is taking decongestant for allergies. He monitors BP at home, usually runs around 120 -130 systolic. He is taking metoprolol  100 mg daily, Aldactone 25 mg, amlodipine  10 mg, benazepril 40 mg and kcl 20 meq bid. Uses a pill box. No h/o nausea, vomiting, diarrhea. No palpitations, sweating, headaches.  He takes all of his BP meds in morning.  Family h/o HTN in mom, diagnosed at age 55-34.  He did not f/u with me after that  02/2019 PCP increased spironolactone to 25 mg twice a day because of hypokalemia  11/2019 K 3.4 low 01/2020 3.0 01/2020 Stopped KCL and Aldactone due to unknown reason. Stressful time, no follow up with PCP or endo.  01/2021 K 3.1 01/2021 BP high 164/103. PCP noted that it appears as though he never actually started taking the spironolactone. He had switched over from atenolol chlorthalidone to just metoprolol . He did not start the potassium supplements since he had gone off of the atenolol chlorthalidone.  So basically, per PCP note his atenolol got changed to metoprolol , he had stopped chlorthalidone and kcl but never started Aldactone. But per him he was taking Aldactone in the past but then had stopped taking it. He was restarted on Aldactone 25 mg twice a day and continued on benazepril 40 mg, amlodipine  10 mg and metoprolol  XL 100 mg  07/2021 K 5.1 02/2022 he is seeing me today after more than 3 years. He was working and  it was difficult to get off. Therefore, missed appointments and was not able to follow up. BP at home around 120 -140 systolic. Taking metoprolol  100 mg daily, Aldactone 25 mg, amlodipine  10 mg, benazepril 40 mg and kcl 20 meq bid. Uses a pill box. No h/o nausea, vomiting, diarrhea. No licorice. No palpitations, sweating, headaches. He takes all of his BP meds in morning.  He would like surgical cure. I ordered blood work, but he has not been able to get them drawn.  03/2022  he used to take kcl in the past but has not taken kcl since 11/2021. He says it was expired. So currently taking Metoprolol  XL 100 mg daily, Aldactone 25 mg, amlodipine  10 mg, benazepril 40 mg. He has not noticed any significant improvement with Aldactone in BP. He was diagnosed with hypogonadism, had IVF.  Has ED on Aldactone. Did not have ED prior to starting Aldactone. Weight is stable. Min snoring when tired  03/2022 Aldo 16, PRA 4.6, K 4.1 06/2022 Reduced Aldactone to 25 mg once a day  07/2022 currently prescribed Aldactone 25 mg daily, amlodipine  10 mg daily, benazepril 40 mg daily, metoprolol  100 mg daily - has been out of spironolactone, benazepril and metoprolol  since last week so only taking amlodipine  the past week and has not yet taken it today. Checks BP at home sparingly, last a few weeks ago, does not think his cuff fits, thinks it too small. For unclear reasons, he did not reduce the dose. Discussed importance of not running out of medications. Restarted 07/2022 aldosterone 20, K 3.9, PRA 2.2. Aldosterone reduced to 25 mg daily  10/2022 Aldosterone 19, K 3.8, PRA 1.1  10/2022 Aldactone 25 mg daily, amlodipine  10 mg daily, benazepril 40 mg daily, metoprolol  100 mg daily. He has not taken any BP meds last week. Taking over last 11 days. Stress full work. Does not check BP at home, cuff is small. Advised to buy one. Advised to stop Aldactone.  Started him on doxazosin  2 mg daily to replace Aldactone. He is not reading messages in my chart. Has not been able to check BP. Advised to  01/2023 PRA 0.6, aldosterone 11, K4.0. Says his first cousin had adrenal problem and had surgery about 3 weeks ago he says for hyperaldosteronism 03/2023 PRA 1.0, K3.6, aldosterone 9.7. Meta Mean NP referred to IV salt loading but he never responded  05/2023 Doxazosin  2 mg nightly, Amlodipine  10 mg daily, Benazepril 40 mg nightly, Metoprolol  100 mg bedtime. Still not checking BP at home. He prefers IV 05/2023 PRA 8.1, Cr  1.4 high, K 3.7, surprisingly high PRA. I asked for meds. No answer. Could not proceed with saline suppression because PRA was too high Advised to reduce lisinopril to 20 mg and increase doxazosin  to 4 mg.  He was working so he did not read message. Detailed discussion about compliance and communication. But he promises to read his messages moving forward 01/2024 We have tried on multiple occasions to communicate with him but had been unable to. He is not picking up phone because he thinks they are scams. Not reading his messages because he is busy. Not checking his BP. We had a detailed discussion regarding communication and compliance. I again advised him to start treatment empirically so at least his BP would be under better control.  But he would like to do full evaluation. In that case I advised him to stop lisinopril, restart metoprolol  which I prescribed, continue amlodipine  and doxazosin . Ordered blood work  in 6 weeks.  01/2024 Aldosterone 23 high, PRA 6.9, renal panel normal.    2 Diabetes T2 07/2022 Takes Ozempic 1 mg weekly, Metformin 1000 BID, Tresiba 52 BID. He had severe vomiting at the time of appt. He noted that about 2 weeks ago he felt similar in the middle of the night, but it passed without vomiting. 08/2022 Started Jardiance  because of proteinuria.  10/2022 Takes Ozempic 1 mg weekly, Tresiba, Jardiance . BG 70-80's. Lost 3 lbs. He has been on Ozempic for 2 years and tolerating that and then they added Jardiance  and that he has nausea and vomiting. He got sick again last week. He notices he gets only before medical appt. He did not have any nausea since last appt up until last week. No more vomiting so he resumed Ozempic. Advised to reduce Ozempic to 0.5 mcg weekly. 01/2023 Reduced Tresiba to 60 units at bedtime only as FBG lowish  03/2023 FBG in 60's. Reduced Tresiba to 50 units at bedtime, 05/2023 We are officially managing diabetes. He has not been able to get Ozempic for past 2 months  from insurance.  01/2024 He has increased tresiba to 64 units on his own. Lost his meter so not checking BG. This morning BG was 86. We have no other blood sugar readings. He denies feeling hypoglycemia. Reduced Tresiba to 50 units.  03/2024 We have only 2 BG readings with old meter. They were in fasting. No s/s of low BG. He has not picked up new meter so has not been checking blood sugars often Diabetes Regimen: - Modifying factors: - Tresiba 50 units at bedtime, Ozempic 0.5 mg weekly, Jardiance  25 mg and metformin 1000 mg BID Diet: eats 3 meals and 2 snacks  Exercise: walking 3 miles a day  Blood sugars checks: no meter. Does not want sensor though offered  Blood sugars pattern:  Hypoglycemia: none   3 Hyperlipidemia  01/2024 Ran out of statins so not taking.  Not sure when he ran out, but he thinks it could be for more than a year. He would like to get his levels checked today to see how he is doing without medications.  Interim history since last Endocrine appointment: -   Not taking lisinopril. He is not taking doxazosin . He thought I had stopped it but I did not. Taking metoprolol , amlodipine   Checks BP at home, occasionally, they run 130-150 systolic  He did blood work on the same day he saw me but did not do blood work in 6 weeks I had ordered.  He says he forgot about it. This appt today was to follow up on that. He continues to be nonadherent to treatment plan continue. He says he forgets. 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 Normal seminal volume and pH. He had normal chromosomal analysis and no micro-deletions. S/p testicular biopsy in 2017 TFTs normal.   PCP Jannetta Donnice Lenis, MD PHONE: [phone] FAX: [phone]     Medical history: Past Medical History:  Diagnosis Date  . Diabetes mellitus type 2, uncomplicated (CMS/HHS-HCC)   . Gout   .  Hypertension   . Hypokalemia 12/08/2012  . Morbid obesity with BMI of 40.0-44.9, adult (CMS/HHS-HCC)   . Skin graft hematoma 03/07/2022   right leg    Past surgical history: Past Surgical History:  Procedure Laterality Date  . COLONOSCOPY W/REMOVAL LESIONS BY SNARE N/A 07/15/2019  Procedure: COLORECTAL CANCER SCREENING;  Surgeon: Gaile Claretta Denmark, MD;  Location: [facility];  Service: Gastroenterology;  Laterality: N/A;  . wound debridement Right 11/02/2021   Dr Henriette Pierre  . COLONOSCOPY    . sperm retrieval      Family History: Family History  Problem Relation Name Age of Onset  . High blood pressure (Hypertension) Mother    . Prostate cancer Father    . Lymphoma Father    . Lung cancer Father    . Colon cancer Paternal Grandmother    . Colon polyps Neg Hx    . Anesthesia problems Neg Hx      Medications:  Current Outpatient Medications:  .  allopurinoL  (ZYLOPRIM ) 100 MG tablet, Take 2 tablets by mouth once daily, Disp: 60 tablet, Rfl: 2 .  atorvastatin  (LIPITOR) 10 MG tablet, Take 1 tablet (10 mg total) by mouth at bedtime, Disp: 90 tablet, Rfl: 3 .  blood glucose diagnostic (CONTOUR TEST STRIPS) test strip, 3 (three) times daily Use as instructed., Disp: 100 each, Rfl: 11 .  blood-gluc,BP meter,adult cuff Devi, Use 1 kit once daily, Disp: 1 each, Rfl: 1 .  blood-glucose meter (CONTOUR NEXT EZ METER) Misc, 1 each as directed . Contour next, Disp: 1 each, Rfl: 1 .  CIALIS 5 mg tablet, Take 1 tablet by mouth once daily, Disp: 10 tablet, Rfl: 7 .  CLOMID 50 mg tablet, Take 25 mg by mouth once daily, Disp: , Rfl:  .  doxazosin  (CARDURA ) 4 MG tablet, Take 1 tablet (4 mg total) by mouth at bedtime, Disp: 30 tablet, Rfl: 11 .  empagliflozin  (JARDIANCE ) 25 mg tablet, Take 1 tablet (25 mg total) by mouth once daily, Disp: 90 tablet, Rfl: 3 .  fluticasone propionate (FLONASE) 50 mcg/actuation nasal spray, Place 1 spray into both nostrils 2 (two) times daily, Disp: 16  g, Rfl: 0 .  lancets (ONETOUCH DELICA PLUS LANCET), Use 1 each 3 (three) times daily, Disp: 100 each, Rfl: 11 .  meloxicam  (MOBIC ) 7.5 MG tablet, Take by mouth, Disp: , Rfl:  .  metFORMIN (GLUCOPHAGE) 1000 MG tablet, Take 1 tablet (1,000 mg total) by mouth 2 (two) times daily with meals for 360 days, Disp: 180 tablet, Rfl: 3 .  metoprolol  SUCCinate (TOPROL -XL) 100 MG XL tablet, Take 1 tablet (100 mg total) by mouth at bedtime, Disp: 90 tablet, Rfl: 1 .  ondansetron  (ZOFRAN ) 4 MG tablet, Take 4 mg by mouth every 8 (eight) hours as needed, Disp: , Rfl:  .  oxyCODONE  (ROXICODONE ) 5 MG immediate release tablet, TAKE 1 TABLET BY MOUTH EVERY 6 HOURS AS NEEDED FOR SEVERE PAIN FOR UP TO FIVE DAYS, Disp: , Rfl:  .  promethazine -dextromethorphan (PROMETHAZINE -DM) 6.25-15 mg/5 mL syrup, Take 5 mLs by mouth every 6 (six) hours as needed, Disp: 120 mL, Rfl: 0 .  promethazine -dextromethorphan (PROMETHAZINE -DM) 6.25-15 mg/5 mL syrup, Take by mouth, Disp: , Rfl:  .  semaglutide (OZEMPIC) 0.25 mg or 0.5 mg (2 mg/3 mL) pen injector, Inject 0.75 mLs (0.5 mg total) subcutaneously once a week for 90 days, Disp: 9 mL, Rfl: 0 .  tadalafiL (CIALIS) 20 MG tablet, Take 20 mg by mouth once daily as needed, Disp: , Rfl:  .  traMADoL  (ULTRAM ) 50 mg tablet, Take 50 mg by mouth every 8 (eight) hours as needed, Disp: , Rfl:  .  TRESIBA FLEXTOUCH U-200 pen injector (concentration 200 units/mL), Inject 50 Units subcutaneously at bedtime, Disp: 45 mL, Rfl: 0 .  amLODIPine  (NORVASC )  10 MG tablet, Take 1 tablet (10 mg total) by mouth once daily for 180 days, Disp: 90 tablet, Rfl: 1 .  blood glucose diagnostic (ONETOUCH VERIO TEST STRIPS) test strip, 1 each (1 strip total) 3 (three) times daily, Disp: 100 each, Rfl: 11 .  lancets, Use 1 each 3 (three) times daily Use as instructed., Disp: 100 each, Rfl: 12  Allergies: Allergies  Allergen Reactions  . Thiazides Other (See Comments)    Low K   Pertinent Social:   ROS: negative  except as outlined above. I either addressed the positive symptoms myself or advised the patient to follow up with PCP for all non-endocrine related symptoms and concerns.    Physical Exam:   Vitals:   04/16/24 0836  Weight: (!) 122.5 kg (270 lb)  Height: 180.3 cm (5' 11)    BP Readings from Last 3 Encounters:  01/29/24 (!) 150/84  06/12/23 135/83  03/27/23 (!) 150/89   Wt Readings from Last 3 Encounters:  04/16/24 (!) 122.5 kg (270 lb)  01/29/24 (!) 126.2 kg (278 lb 3.2 oz)  06/12/23 (!) 124.4 kg (274 lb 3.2 oz)   Body mass index is 37.66 kg/m.  General appearance: Alert and oriented x 3, no acute distress Vitals reviewed and noted in [place] care  Respiratory: Normal respiratory effort Mental appropriate mood. Memory intact.   Assessment and plan:   1 Hypertension, resistant   - Long standing h/o HTN. He says his BP was under good control, even before he was started on Aldactone. But chart review showed high BP in 01/2021 when he was off Aldactone. Nevertheless, he is requiring 4 antihypertensives to control his BP, so it qualifies for resistant hypertension.  - He has h/o significant hypokalemia although on K losing diuretic. No h/o nausea, vomiting, diarrhea to explain hypokalemia. In 07/2018 he could have had spontaneous hypokalemia but there is confusion about what he was taking at that time.  - 10/2018 K 2.9, Aldo 5.7, PRA 5.5. His PRA is not low, but this was in the setting of hypokalemia. - 2011 CT scan normal adrenals. Repeat in 11/2022 again with normal appearing adrenal glands.  - Plasma metanephrines normal in 2020 so pheochromocytoma ruled out  - 10/2018 1 mg DST normal so [place] ruled out  - Says his first cousin had adrenal problem and had surgery for hyperaldosteronism - I have had detailed discussion about essential hypertension, secondary hypertension, primary hyperaldosteronism, steps involved in diagnostic evaluation, and treatment options at his previous  appointments. I have explained that the surgical cure for primary hyperaldosteronism is only possible if we confirm the diagnosis, do adrenal vein sampling and if it reveals unilateral source of aldosterone overproduction. He had not noticed a big improvement with Aldactone in BP which is surprising if he had PA. He also does not want to take that medication again because it caused him to have gynecomastia per him. I have also explained that if he does qualify for surgical treatment, we expect complete resolution of hypokalemia and improvement in blood pressure. We do not expect complete resolution of hypertension because he probably also has underlying essential hypertension and some component from obesity. He would like to proceed with evaluation for primary hyperaldosteronism and possible surgical cure. If he does not qualify for surgical treatment, will try eplerenone.  - 03/2022 Aldo 16, PRA 4.6, K 4.1 while taking spironolactone 25 mg BID. Aldosterone was reduced to 25 mg daily, but PRA was not suppressed in 10/2022, so Aldactone was  stopped, and he was started on doxazosin  2 mg nightly. 01/2023 aldosterone 11 and PRA 0.6 so his PRA was suppressed after stopping Aldactone, but again PRA was not suppressed in 03/2023, and aldosterone was also <10. PRA in 05/2023 was quite high and surprisingly rose from 1 to 8.4.  He denied taking anything different to explain the results. He wants full evaluation rather than empiric treatment. So lisinopril was reduced to 20 mg and doxazosin  increase to 4 mg. Last labs in 01/2024 again showed high aldosterone but with high PRA. In 01/2024, I advised him to stop lisinopril and restart metoprolol , continue amlodipine  and doxazosin , but he stopped doxazosin .  - I advised him to restart taking doxazosin  - Check renal panel, PRA, and aldosterone today  - If PRA is still high, will do MRA of renal arteries.  - If PRA is low, I will do AVS - Advised to check blood pressure at home and  let me know if high.  I have prescribed blood pressure meter - We discussed lack of communication and lack of adherence with treatment plan. He is often not following recommendations in terms of taking medications as advised and doing lab work on time. I have explained that his lack of communication and adherence is delaying care and he may already be developing renal failure. I advised him to involve his wife  2 T2 Diabetes - Controlled based on last HbA1c 5.4 % in 01/2024. Check HbA1c in a few months  - I have discussed complications of uncontrolled diabetes and importance of optimizing BG control, healthy lifestyle including diet and exercise as tolerated, different class of medications along with their R/B/SE/AE, BG monitoring, management of hyper or hypoglycemia, communication if BG are high or low. - Continue taking the same dose of Ozempic as he previously had nausea and vomiting on 1 mg weekly dose, which improved with dose reduction. - Continue Tresiba, Jardiance  25 mg and metformin 1000 mg BID - He has increased basal insulin  dose a lot.  - I have cautioned against hypoglycemia.  - I have advised him to check blood sugars at different times including postprandially and fasting state because I suspect that he may be having hypoglycemia in fasting state.  - Does not want sensor though offered. I have prescribed glucometer. - Last diabetes education/nutritionist?  - Check Vitamin B12 level while taking metformin. - Assessment of end-organ diabetes-related complications: - (-/+) Known h/o diabetic retinopathy: Last dilated eye exam? Recommend yearly eye exam.  (+) Known h/o diabetic nephropathy or proteinuria: Urine microalbumin with significant improvement in 10/2022 since addition of Jardiance . Last continues to be normal in 01/2024. Check in 1 year (-) Known h/o neuropathy: Last foot exam by podiatry in 02/2024 (-) Known h/o CAD/PAD/Stroke:  3 Hyperlipidemia - Last FLP in 01/2024 reasonably  well controlled without taking statins.  - He was on statin previously but had run out for at least a few months in 01/2024.  - I renewed his statins in 01/2024.  - His lipids were good in 01/2024 without statins however because of family history of CAD, we decided to continue taking statins - LFT normal  - Check FLP and LFT in few months.  -TFTs normal in 01/2023. Check before next visit

## 2024-07-09 NOTE — Progress Notes (Signed)
 This video encounter was conducted with the patient's (or proxy's) verbal consent via secure, interactive audio and video telecommunications while in clinic/office/hospital.  The patient (or proxy) was instructed to have this encounter in a suitably private space and to only have persons present to whom they give permission to participate. In addition, patient identity was confirmed by use of name plus an additional identifier.  2020/08/30 E&M) This visit was coded based on time. I spent a total of 25 minutes in both face-to-face and non-face-to-face activities for this visit on the date of this encounter.   Subjective: -Robert Brennan is a 53 y.o. male who was seen today for a f/u from his last visit with me in 03/2024. He was first evaluated by me in 11/2018 in consultation from Robert Agee, NP for hypertension. No f/u from 11/2018 to 02/2022 when he reestablished. Records in EMR reviewed since last visit. Please see EMR for actual lab values.   1 Hypertension, resistant   Diagnosed with hypertension in 2002.   2011 CT scan normal adrenals His HTN required escalation of medications/regimen to control. By 2013/08/30 he was on 4 medications amlodipine  10 mg, benazepril 40 mg, atenolol 100 mg, and chlorthalidone 25 mg daily. Had hypokalemia so he was started on kcl supplements and dose had to be increased because of continued hypokalemia.  10/2017 Reduced atenolol-chlorthalidone into half with same dose of kcl supplements because of hypokalemia 04/2018 K 3.3 on Chlorthalidone 12.5 mg and 3 daily of 20 meq kcl so switched Chlorthalidone to spironolactone and stopped kcl supplements. 07/2018 K 3.0, so restarted Kcl supplements and increase dose to 40 meq in 10/2018. 10/2018 K 2.9, aldo 5.7, PRA 5.5, plasma meta normal on metoprolol  100 mg daily, Aldactone 25 mg, amlodipine  10 mg, benazepril 40 mg and kcl 20 meq bid. 10/2018 1 mg DST cortisol 0.6, ACTH 3 low, Dexa 231, K 3.9 11/2018 First visit with me which was telehealth  because of COVID. He says his BP is under good control except when he gets under stress or if he is taking decongestant for allergies. He monitors BP at home, usually runs around 120 -130 systolic. He is taking metoprolol  100 mg daily, Aldactone 25 mg, amlodipine  10 mg, benazepril 40 mg and kcl 20 meq bid. Uses a pill box. No h/o nausea, vomiting, diarrhea. No palpitations, sweating, headaches.  He takes all of his BP meds in morning.  Family h/o HTN in mom, diagnosed at age 77-34.  He did not f/u with me after that  02/2019 PCP increased spironolactone to 25 mg twice a day because of hypokalemia  11/2019 K 3.4 low 01/2020 3.0 01/2020 Stopped KCL and Aldactone due to unknown reason. Stressful time, no follow up with PCP or endo.  01/2021 K 3.1 01/2021 BP high 164/103. PCP noted that it appears as though he never actually started taking the spironolactone. He had switched over from atenolol chlorthalidone to just metoprolol . He did not start the potassium supplements since he had gone off of the atenolol chlorthalidone.  So basically, per PCP note his atenolol got changed to metoprolol , he had stopped chlorthalidone and kcl but never started Aldactone. But per him he was taking Aldactone in the past but then had stopped taking it. He was restarted on Aldactone 25 mg twice a day and continued on benazepril 40 mg, amlodipine  10 mg and metoprolol  XL 100 mg  07/2021 K 5.1 02/2022 he is seeing me today after more than 3 years. He was working and  it was difficult to get off. Therefore, missed appointments and was not able to follow up. BP at home around 120 -140 systolic. Taking metoprolol  100 mg daily, Aldactone 25 mg, amlodipine  10 mg, benazepril 40 mg and kcl 20 meq bid. Uses a pill box. No h/o nausea, vomiting, diarrhea. No licorice. No palpitations, sweating, headaches. He takes all of his BP meds in morning.  He would like surgical cure. I ordered blood work, but he has not been able to get them drawn.  03/2022  he used to take kcl in the past but has not taken kcl since 11/2021. He says it was expired. So currently taking Metoprolol  XL 100 mg daily, Aldactone 25 mg, amlodipine  10 mg, benazepril 40 mg. He has not noticed any significant improvement with Aldactone in BP. He was diagnosed with hypogonadism, had IVF.  Has ED on Aldactone. Did not have ED prior to starting Aldactone. Weight is stable. Min snoring when tired  03/2022 Aldo 16, PRA 4.6, K 4.1 06/2022 Reduced Aldactone to 25 mg once a day  07/2022 currently prescribed Aldactone 25 mg daily, amlodipine  10 mg daily, benazepril 40 mg daily, metoprolol  100 mg daily - has been out of spironolactone, benazepril and metoprolol  since last week so only taking amlodipine  the past week and has not yet taken it today. Checks BP at home sparingly, last a few weeks ago, does not think his cuff fits, thinks it too small. For unclear reasons, he did not reduce the dose. Discussed importance of not running out of medications. Restarted 07/2022 aldosterone 20, K 3.9, PRA 2.2. Aldosterone reduced to 25 mg daily  10/2022 Aldosterone 19, K 3.8, PRA 1.1  10/2022 Aldactone 25 mg daily, amlodipine  10 mg daily, benazepril 40 mg daily, metoprolol  100 mg daily. He has not taken any BP meds last week. Taking over last 11 days. Stress full work. Does not check BP at home, cuff is small. Advised to buy one. Advised to stop Aldactone.  Started him on doxazosin  2 mg daily to replace Aldactone. He is not reading messages in my chart. Has not been able to check BP. Advised to  01/2023 PRA 0.6, aldosterone 11, K4.0. Says his first cousin had adrenal problem and had surgery about 3 weeks ago he says for hyperaldosteronism 03/2023 PRA 1.0, K3.6, aldosterone 9.7. Meta Mean NP referred to IV salt loading but he never responded  05/2023 Doxazosin  2 mg nightly, Amlodipine  10 mg daily, Benazepril 40 mg nightly, Metoprolol  100 mg bedtime. Still not checking BP at home. He prefers IV 05/2023 PRA 8.1, Cr  1.4 high, K 3.7, surprisingly high PRA. I asked for meds. No answer. Could not proceed with saline suppression because PRA was too high Advised to reduce lisinopril to 20 mg and increase doxazosin  to 4 mg.  He was working so he did not read message. Detailed discussion about compliance and communication. But he promises to read his messages moving forward 01/2024 We have tried on multiple occasions to communicate with him but had been unable to. He is not picking up phone because he thinks they are scams. Not reading his messages because he is busy. Not checking his BP. We had a detailed discussion regarding communication and compliance. I again advised him to start treatment empirically so at least his BP would be under better control.  But he would like to do full evaluation. In that case I advised him to stop lisinopril, restart metoprolol  which I prescribed, continue amlodipine  and doxazosin . Ordered blood work  in 6 weeks.  01/2024 Aldosterone 23 high, PRA 6.9, renal panel normal.   03/2024 Not taking lisinopril or doxazosin . He thought I had stopped doxazosin , but I had not. Taking metoprolol , amlodipine . Checks BP at home, occasionally, they run 130-150 systolic. He did blood work on the same day he saw me but did not do blood work in 6 weeks I had ordered.  He says he forgot about it. This appt today was to follow up on that. I advised him to restart taking doxazosin  and get repeat labs.  03/2024 Aldosterone 16, PRA <0.6, renal panel normal  2 Diabetes T2 07/2022 Takes Ozempic 1 mg weekly, Metformin 1000 BID, Tresiba 52 BID. He had severe vomiting at the time of appt. He noted that about 2 weeks ago he felt similar in the middle of the night, but it passed without vomiting. 08/2022 Started Jardiance  because of proteinuria.  10/2022 Takes Ozempic 1 mg weekly, Tresiba, Jardiance . BG 70-80's. Lost 3 lbs. He has been on Ozempic for 2 years and tolerating that and then they added Jardiance  and that he has  nausea and vomiting. He got sick again last week. He notices he gets only before medical appt. He did not have any nausea since last appt up until last week. No more vomiting so he resumed Ozempic. Advised to reduce Ozempic to 0.5 mcg weekly. 01/2023 Reduced Tresiba to 60 units at bedtime only as FBG lowish  03/2023 FBG in 60's. Reduced Tresiba to 50 units at bedtime, 05/2023 We are officially managing diabetes. He has not been able to get Ozempic for past 2 months from insurance.  01/2024 He has increased tresiba to 64 units on his own. Lost his meter so not checking BG. This morning BG was 86. We have no other BG readings. He denies feeling hypoglycemia. I reduced Tresiba to 50 units.  03/2024 He has not picked up new meter so has not been checking BG often. We have only 2 BG readings with old meter. They were fasting. No s/s of low BG. I have cautioned against hypoglycemia. I advised him to check BG at different times including postprandial and fasting state because I suspect that he may be having hypoglycemia in fasting state. Declined sensor though offered. I have prescribed glucometer. Diabetes Regimen: - Modifying factors: - Tresiba 50 units at bedtime, Ozempic 0.5 mg weekly, Jardiance  25 mg and metformin 1000 mg BID Diet: eats 3 meals and 2 snacks  Exercise: walking 3 miles a day  Blood sugars checks: no meter. Does not want sensor though offered  Blood sugars pattern:  Hypoglycemia: none   3 Hyperlipidemia  01/2024 Ran out of statins so not taking. Not sure when he ran out, but he thinks it could be for more than a year. His lipids were good in 01/2024 without statins however because of family history of CAD, we decided to restart statins   Interim history since last Endocrine appointment: -  He taking doxazosin , metoprolol , and amlodipine . These are the only three BP meds he is taking. He is not taking Kcl Checks BP at home, occasionally, they run ~ 130 systolic  He had issues with audio  today, so MA had to spend extra time with him to connect. 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 So we had brief discussion, but decisive. He said he is on the same regimen for diabetes and his BG are fine.  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 Normal seminal volume and pH. He had normal chromosomal analysis and no micro-deletions. S/p testicular biopsy in 2017 TFTs normal.   PCP Jannetta Donnice Lenis, MD PHONE: [phone] FAX: [phone]     Medical history: Past Medical History:  Diagnosis Date  . Diabetes mellitus type 2, uncomplicated (CMS/HHS-HCC)   . Gout   . Hypertension   . Hypokalemia 12/08/2012  . Morbid obesity with BMI of 40.0-44.9, adult (CMS-HCC)   . Skin graft hematoma 03/07/2022   right leg    Past surgical history: Past Surgical History:  Procedure Laterality Date  . COLONOSCOPY W/REMOVAL LESIONS BY SNARE N/A 07/15/2019   Procedure: COLORECTAL CANCER SCREENING;  Surgeon: Gaile Claretta Denmark, MD;  Location: [facility];  Service: Gastroenterology;  Laterality: N/A;  . wound debridement Right 11/02/2021   Dr Henriette Pierre  . COLONOSCOPY    . sperm retrieval      Family History: Family History  Problem Relation Name Age of Onset  . High blood pressure (Hypertension) Mother    . Prostate cancer Father    . Lymphoma Father    . Lung cancer Father    . Colon cancer Paternal Grandmother    . Colon polyps Neg Hx    . Anesthesia problems Neg Hx      Medications:  Current Outpatient Medications:  .  allopurinoL  (ZYLOPRIM ) 100 MG tablet, Take 2 tablets by mouth once daily, Disp: 60 tablet, Rfl: 2 .  amLODIPine  (NORVASC ) 10 MG tablet, Take 1 tablet (10 mg total) by mouth once daily for 180 days, Disp: 90 tablet, Rfl: 1 .  atorvastatin  (LIPITOR) 10 MG tablet, Take 1 tablet (10 mg total) by mouth at  bedtime, Disp: 90 tablet, Rfl: 3 .  blood glucose diagnostic (CONTOUR TEST STRIPS) test strip, 3 (three) times daily Use as instructed., Disp: 100 each, Rfl: 11 .  blood glucose diagnostic (ONETOUCH VERIO TEST STRIPS) test strip, 1 each (1 strip total) 3 (three) times daily, Disp: 100 each, Rfl: 11 .  blood-gluc,BP meter,adult cuff Devi, Use 1 kit once daily, Disp: 1 each, Rfl: 1 .  blood-glucose meter (CONTOUR NEXT EZ METER) Misc, 1 each as directed . Contour next, Disp: 1 each, Rfl: 1 .  CIALIS 5 mg tablet, Take 1 tablet by mouth once daily, Disp: 10 tablet, Rfl: 7 .  CLOMID 50 mg tablet, Take 25 mg by mouth once daily, Disp: , Rfl:  .  doxazosin  (CARDURA ) 4 MG tablet, Take 1 tablet (4 mg total) by mouth at bedtime, Disp: 30 tablet, Rfl: 2 .  empagliflozin  (JARDIANCE ) 25 mg tablet, Take 1 tablet (25 mg total) by mouth once daily, Disp: 90 tablet, Rfl: 3 .  fluticasone propionate (FLONASE) 50 mcg/actuation nasal spray, Place 1 spray into both nostrils 2 (two) times daily, Disp: 16 g, Rfl: 0 .  lancets (ONETOUCH DELICA PLUS LANCET), Use 1 each 3 (three) times daily, Disp: 100 each, Rfl: 11 .  lancets, Use 1 each 3 (three) times daily Use as instructed., Disp: 100 each, Rfl: 12 .  meloxicam  (MOBIC ) 7.5 MG tablet, Take by mouth, Disp: , Rfl:  .  metFORMIN (GLUCOPHAGE) 1000 MG tablet, Take 1 tablet (1,000 mg total) by mouth 2 (two) times daily with meals for 360 days, Disp: 180 tablet, Rfl: 3 .  metoprolol  SUCCinate (TOPROL -XL) 100 MG XL tablet, Take 1 tablet (100 mg total) by  mouth at bedtime, Disp: 90 tablet, Rfl: 1 .  ondansetron  (ZOFRAN ) 4 MG tablet, Take 4 mg by mouth every 8 (eight) hours as needed, Disp: , Rfl:  .  oxyCODONE  (ROXICODONE ) 5 MG immediate release tablet, TAKE 1 TABLET BY MOUTH EVERY 6 HOURS AS NEEDED FOR SEVERE PAIN FOR UP TO FIVE DAYS, Disp: , Rfl:  .  promethazine -dextromethorphan (PROMETHAZINE -DM) 6.25-15 mg/5 mL syrup, Take 5 mLs by mouth every 6 (six) hours as needed, Disp:  120 mL, Rfl: 0 .  promethazine -dextromethorphan (PROMETHAZINE -DM) 6.25-15 mg/5 mL syrup, Take by mouth, Disp: , Rfl:  .  semaglutide (OZEMPIC) 0.25 mg or 0.5 mg (2 mg/3 mL) pen injector, Inject 0.75 mLs (0.5 mg total) subcutaneously once a week for 90 days, Disp: 9 mL, Rfl: 0 .  tadalafiL (CIALIS) 20 MG tablet, Take 20 mg by mouth once daily as needed, Disp: , Rfl:  .  traMADoL  (ULTRAM ) 50 mg tablet, Take 50 mg by mouth every 8 (eight) hours as needed, Disp: , Rfl:  .  TRESIBA FLEXTOUCH U-200 pen injector (concentration 200 units/mL), Inject 50 Units subcutaneously at bedtime, Disp: 45 mL, Rfl: 0  Allergies: Allergies  Allergen Reactions  . Thiazides Other (See Comments)    Low K   Pertinent Social:   ROS: negative except as outlined above. I either addressed the positive symptoms myself or advised the patient to follow up with PCP for all non-endocrine related symptoms and concerns.    Physical Exam:   Vitals:    BP Readings from Last 3 Encounters:  01/29/24 (!) 150/84  06/12/23 135/83  03/27/23 (!) 150/89   Wt Readings from Last 3 Encounters:  04/16/24 (!) 122.5 kg (270 lb)  01/29/24 (!) 126.2 kg (278 lb 3.2 oz)  06/12/23 (!) 124.4 kg (274 lb 3.2 oz)   There is no height or weight on file to calculate BMI.  General appearance: Alert and oriented x 3, no acute distress Vitals reviewed and noted in [facility] care  Respiratory: Normal respiratory effort Mental appropriate mood. Memory intact.   Assessment and plan:    1 Hypertension, resistant   - Long standing h/o HTN. He says his BP was under good control, even before he was started on Aldactone. But chart review showed high BP in 01/2021 when he was off Aldactone. Nevertheless, he is requiring 4 antihypertensives to control his BP, so it qualifies for resistant hypertension.  - He has h/o significant hypokalemia although on K losing diuretic. No h/o nausea, vomiting, diarrhea to explain hypokalemia. In 07/2018 he could have  had spontaneous hypokalemia but there is no clarity about what he was taking at that time.  - 10/2018 K 2.9, Aldo 5.7, PRA 5.5. His PRA is not low, but this was in the setting of hypokalemia. - 2011 CT scan normal adrenals. Repeat in 11/2022 again with normal appearing adrenal glands.  - Plasma metanephrines normal in 2020 so pheochromocytoma ruled out  - 10/2018 1 mg DST normal so [place] ruled out  - Says his first cousin had adrenal problem and had surgery for hyperaldosteronism - I have had detailed discussion about essential hypertension, secondary hypertension, primary hyperaldosteronism, steps involved in diagnostic evaluation, and treatment options at his previous appointments. I have explained that the surgical cure for primary hyperaldosteronism is only possible if we confirm the diagnosis, do adrenal vein sampling and if it reveals unilateral source of aldosterone overproduction. He had not noticed a big improvement with Aldactone in BP which is surprising if he has  PA. He also does not want to take that medication again because it caused him to have gynecomastia per him. I have also explained that if he does qualify for surgical treatment, we expect complete resolution of hypokalemia and improvement in blood pressure. We do not expect complete resolution of hypertension because he probably also has underlying essential hypertension and some component from obesity. He would like to proceed with evaluation for primary hyperaldosteronism and if possible, surgical cure. If he does not qualify for surgical treatment, will try eplerenone.  - 03/2022 Aldo 16, PRA 4.6, K 4.1 while taking spironolactone 25 mg BID. Aldosterone was reduced to 25 mg daily, but PRA was not suppressed in 10/2022, so Aldactone was stopped, and he was started on doxazosin  2 mg nightly. 01/2023 aldosterone 11 and PRA 0.6 so his PRA was suppressed after stopping Aldactone, but again PRA was not suppressed in 03/2023, and aldosterone was  also <10. PRA in 05/2023 was quite high and surprisingly rose from 1 to 8.4.  He denied taking anything different to explain the results. He wants full evaluation rather than empiric treatment. So lisinopril was reduced to 20 mg and doxazosin  increase to 4 mg. In 01/2024 again he had high aldosterone but with high PRA. In 01/2024, I stopped lisinopril and restarted metoprolol , continued amlodipine  and doxazosin , but he stopped doxazosin . In 03/2024 I advised him to restart taking doxazosin  - Finally in 03/2024 he has undetectable PRA, and aldosterone 16 with normokalaemia.  - Check PRA and aldosterone again tomorrow.  If consistent with primary hyperaldosteronism, will refer for AVS. - Advised to check blood pressure at home and let me know if high.  I have prescribed blood pressure meter - I have discussed lack of communication and lack of adherence with treatment plan. He is often not following recommendations in terms of taking medications as advised and doing lab work on time. I have explained that his lack of communication and adherence is delaying care, and he may already be developing renal failure. I advised him to involve his wife  2 T2 Diabetes - Controlled based on last HbA1c 5.4 % in 01/2024. Check HbA1c in a few months  - I have discussed complications of uncontrolled diabetes and importance of optimizing BG control, healthy lifestyle including diet and exercise as tolerated, different class of medications along with their R/B/SE/AE, BG monitoring, management of hyper or hypoglycemia, communication if BG are high or low. - Continue the same dose of Ozempic as he previously had nausea and vomiting on 1 mg weekly dose, which improved with dose reduction. - Continue Tresiba, Jardiance  25 mg and metformin 1000 mg BID - He has increased basal insulin  dose a lot.  - I have cautioned against hypoglycemia.  - I have advised him to check blood sugars at different times including postprandially and fasting  state because I suspect that he may be having hypoglycemia in fasting state. He denies having hypoglycemia today  - Does not want sensor though offered. I have prescribed glucometer. - Last diabetes education/nutritionist?  - Check Vitamin B12 level while taking metformin. - Assessment of end-organ diabetes-related complications: - (+) Known h/o diabetic retinopathy: Last dilated eye exam in 04/2024 showed mild nonproliferative retinopathy.  Recommend yearly eye exam.  (+) Known h/o diabetic nephropathy or proteinuria: Urine microalbumin with significant improvement in 10/2022 since addition of Jardiance . Last continues to be normal in 01/2024. Check in 1 year (-) Known h/o neuropathy: Last foot exam by podiatry in 02/2024 (-) Known  h/o CAD/PAD/Stroke:  3 Hyperlipidemia - Last FLP in 01/2024 reasonably well controlled without taking statins as he had run out.  - However because of family history of CAD, we decided to restart statins. - LFT normal  - Check FLP and LFT in few months.  - TFTs normal in 01/2023. Check before next visit   I will place orders for next time.

## 2024-07-28 ENCOUNTER — Ambulatory Visit (INDEPENDENT_AMBULATORY_CARE_PROVIDER_SITE_OTHER): Payer: BC Managed Care – PPO | Admitting: Vascular Surgery

## 2024-08-15 ENCOUNTER — Other Ambulatory Visit: Payer: Self-pay

## 2024-08-15 ENCOUNTER — Inpatient Hospital Stay: Payer: PRIVATE HEALTH INSURANCE

## 2024-08-15 ENCOUNTER — Emergency Department: Payer: PRIVATE HEALTH INSURANCE

## 2024-08-15 ENCOUNTER — Inpatient Hospital Stay
Admission: EM | Admit: 2024-08-15 | Discharge: 2024-08-18 | DRG: 617 | Disposition: A | Discharge status: Home / Self Care | Payer: PRIVATE HEALTH INSURANCE | Attending: Internal Medicine | Admitting: Internal Medicine

## 2024-08-15 DIAGNOSIS — L03115 Cellulitis of right lower limb: Secondary | ICD-10-CM | POA: Diagnosis present

## 2024-08-15 DIAGNOSIS — L089 Local infection of the skin and subcutaneous tissue, unspecified: Secondary | ICD-10-CM

## 2024-08-15 DIAGNOSIS — L03119 Cellulitis of unspecified part of limb: Secondary | ICD-10-CM

## 2024-08-15 DIAGNOSIS — M109 Gout, unspecified: Secondary | ICD-10-CM | POA: Diagnosis present

## 2024-08-15 DIAGNOSIS — E119 Type 2 diabetes mellitus without complications: Secondary | ICD-10-CM

## 2024-08-15 DIAGNOSIS — M86171 Other acute osteomyelitis, right ankle and foot: Secondary | ICD-10-CM | POA: Diagnosis not present

## 2024-08-15 DIAGNOSIS — I10 Essential (primary) hypertension: Secondary | ICD-10-CM | POA: Diagnosis present

## 2024-08-15 DIAGNOSIS — E11621 Type 2 diabetes mellitus with foot ulcer: Principal | ICD-10-CM

## 2024-08-15 DIAGNOSIS — E66813 Obesity, class 3: Secondary | ICD-10-CM | POA: Insufficient documentation

## 2024-08-15 DIAGNOSIS — L02611 Cutaneous abscess of right foot: Secondary | ICD-10-CM | POA: Diagnosis not present

## 2024-08-15 LAB — CBC WITH DIFFERENTIAL/PLATELET
Abs Immature Granulocytes: 0.02 K/uL (ref 0.00–0.07)
Basophils Absolute: 0 K/uL (ref 0.0–0.1)
Basophils Relative: 0 %
Eosinophils Absolute: 0.2 K/uL (ref 0.0–0.5)
Eosinophils Relative: 3 %
HCT: 37.9 % — ABNORMAL LOW (ref 39.0–52.0)
Hemoglobin: 12.9 g/dL — ABNORMAL LOW (ref 13.0–17.0)
Immature Granulocytes: 0 %
Lymphocytes Relative: 18 %
Lymphs Abs: 1.3 K/uL (ref 0.7–4.0)
MCH: 30.1 pg (ref 26.0–34.0)
MCHC: 34 g/dL (ref 30.0–36.0)
MCV: 88.3 fL (ref 80.0–100.0)
Monocytes Absolute: 0.6 K/uL (ref 0.1–1.0)
Monocytes Relative: 8 %
Neutro Abs: 5.1 K/uL (ref 1.7–7.7)
Neutrophils Relative %: 71 %
Platelets: 193 K/uL (ref 150–400)
RBC: 4.29 MIL/uL (ref 4.22–5.81)
RDW: 12.5 % (ref 11.5–15.5)
WBC: 7.3 K/uL (ref 4.0–10.5)
nRBC: 0 % (ref 0.0–0.2)

## 2024-08-15 LAB — COMPREHENSIVE METABOLIC PANEL WITH GFR
ALT: 20 U/L (ref 0–44)
AST: 20 U/L (ref 15–41)
Albumin: 4.1 g/dL (ref 3.5–5.0)
Alkaline Phosphatase: 60 U/L (ref 38–126)
Anion gap: 13 (ref 5–15)
BUN: 15 mg/dL (ref 6–20)
CO2: 24 mmol/L (ref 22–32)
Calcium: 9.2 mg/dL (ref 8.9–10.3)
Chloride: 102 mmol/L (ref 98–111)
Creatinine, Ser: 1.18 mg/dL (ref 0.61–1.24)
GFR, Estimated: >60 mL/min (ref >60)
Glucose, Bld: 175 mg/dL — ABNORMAL HIGH (ref 70–99)
Potassium: 4 mmol/L (ref 3.5–5.1)
Sodium: 138 mmol/L (ref 135–145)
Total Bilirubin: 1.1 mg/dL (ref 0.0–1.2)
Total Protein: 7.7 g/dL (ref 6.5–8.1)

## 2024-08-15 LAB — GLUCOSE, CAPILLARY
Glucose-Capillary: 113 mg/dL — ABNORMAL HIGH (ref 70–99)
Glucose-Capillary: 167 mg/dL — ABNORMAL HIGH (ref 70–99)

## 2024-08-15 LAB — LACTIC ACID, PLASMA: Lactic Acid, Venous: 1.5 mmol/L (ref 0.5–1.9)

## 2024-08-15 MED ORDER — AMLODIPINE BESYLATE 10 MG PO TABS
10.0000 mg | ORAL_TABLET | Freq: Every morning | ORAL | Status: DC
  Administered 2024-08-17 – 2024-08-18 (×2): 10 mg via ORAL
  Filled 2024-08-15 (×2): qty 1

## 2024-08-15 MED ORDER — KETOROLAC TROMETHAMINE 30 MG/ML IJ SOLN: 30.0000 mg | Freq: Four times a day (QID) | INTRAMUSCULAR | Status: DC | PRN

## 2024-08-15 MED ORDER — INSULIN GLARGINE-YFGN 100 UNIT/ML ~~LOC~~ SOLN
15.0000 [IU] | Freq: Every day | SUBCUTANEOUS | Status: DC
  Administered 2024-08-15 – 2024-08-17 (×3): 15 [IU] via SUBCUTANEOUS
  Filled 2024-08-15 (×4): qty 0.15

## 2024-08-15 MED ORDER — PIPERACILLIN-TAZOBACTAM 3.375 G IVPB 30 MIN
3.3750 g | Freq: Once | INTRAVENOUS | Status: AC
  Administered 2024-08-15: 3.375 g via INTRAVENOUS
  Filled 2024-08-15 (×2): qty 50

## 2024-08-15 MED ORDER — ONDANSETRON HCL 4 MG/2ML IJ SOLN: 4.0000 mg | Freq: Four times a day (QID) | INTRAMUSCULAR | Status: DC | PRN

## 2024-08-15 MED ORDER — ONDANSETRON HCL 4 MG PO TABS: 4.0000 mg | ORAL_TABLET | Freq: Four times a day (QID) | ORAL | Status: DC | PRN

## 2024-08-15 MED ORDER — METOPROLOL SUCCINATE ER 50 MG PO TB24
100.0000 mg | ORAL_TABLET | Freq: Every day | ORAL | Status: DC
  Administered 2024-08-15 – 2024-08-17 (×3): 100 mg via ORAL
  Filled 2024-08-15 (×3): qty 2

## 2024-08-15 MED ORDER — VANCOMYCIN HCL 2000 MG/400ML IV SOLN
2000.0000 mg | Freq: Once | INTRAVENOUS | Status: AC
  Administered 2024-08-15: 2000 mg via INTRAVENOUS
  Filled 2024-08-15: qty 400

## 2024-08-15 MED ORDER — INSULIN ASPART 100 UNIT/ML IJ SOLN
0.0000 [IU] | INTRAMUSCULAR | Status: DC
  Administered 2024-08-16: 2 [IU] via SUBCUTANEOUS
  Administered 2024-08-16 – 2024-08-17 (×6): 3 [IU] via SUBCUTANEOUS
  Administered 2024-08-18: 2 [IU] via SUBCUTANEOUS
  Filled 2024-08-15: qty 2
  Filled 2024-08-15 (×2): qty 3
  Filled 2024-08-15: qty 2
  Filled 2024-08-15 (×4): qty 3

## 2024-08-15 MED ORDER — GADOBUTROL 1 MMOL/ML IV SOLN
10.0000 mL | Freq: Once | INTRAVENOUS | Status: AC | PRN
  Administered 2024-08-15: 10 mL via INTRAVENOUS

## 2024-08-15 MED ORDER — EMPAGLIFLOZIN 25 MG PO TABS
25.0000 mg | ORAL_TABLET | Freq: Every day | ORAL | Status: DC
  Administered 2024-08-16 – 2024-08-18 (×3): 25 mg via ORAL
  Filled 2024-08-15 (×3): qty 1

## 2024-08-15 MED ORDER — DOXAZOSIN MESYLATE 4 MG PO TABS
4.0000 mg | ORAL_TABLET | Freq: Every day | ORAL | Status: DC
  Administered 2024-08-15 – 2024-08-17 (×3): 4 mg via ORAL
  Filled 2024-08-15 (×3): qty 1

## 2024-08-15 MED ORDER — ALLOPURINOL 100 MG PO TABS
200.0000 mg | ORAL_TABLET | Freq: Every morning | ORAL | Status: DC
  Administered 2024-08-17 – 2024-08-18 (×2): 200 mg via ORAL
  Filled 2024-08-15 (×3): qty 2

## 2024-08-15 MED ORDER — ACETAMINOPHEN 650 MG RE SUPP: 650.0000 mg | Freq: Four times a day (QID) | RECTAL | Status: DC | PRN

## 2024-08-15 MED ORDER — ATORVASTATIN CALCIUM 10 MG PO TABS
10.0000 mg | ORAL_TABLET | Freq: Every day | ORAL | Status: DC
  Administered 2024-08-15 – 2024-08-17 (×3): 10 mg via ORAL
  Filled 2024-08-15 (×3): qty 1

## 2024-08-15 MED ORDER — HYDROCODONE-ACETAMINOPHEN 5-325 MG PO TABS: 1.0000 | ORAL_TABLET | ORAL | Status: DC | PRN

## 2024-08-15 MED ORDER — ACETAMINOPHEN 325 MG PO TABS: 650.0000 mg | ORAL_TABLET | Freq: Four times a day (QID) | ORAL | Status: DC | PRN

## 2024-08-15 NOTE — ED Triage Notes (Signed)
 Pt comes in via pov from [facility] with complaints of right foot swelling that started yesterday. Pt has no complaints of pain. Pt has a history of gout, hypertension and type 2 diabetes. Pt with no signs of acute distress at this time.

## 2024-08-15 NOTE — ED Provider Notes (Signed)
 [facility] Provider Note    Event Date/Time   First MD Initiated Contact with Patient 08/15/24 1721     (approximate)   History   Foot Swelling   HPI  Robert Brennan is a 53 y.o. male type 2 diabetes, hypertension, previous history of DVT not on any blood thinners (provoked) who presents to the emergency department with 1 week of right great toe wound and swelling along with worsening foot pain and streaking up his right calf.  Patient states that he injured his foot while at work when it got wet and he has noticed increasing pain and swelling to that area.  He became concerned today when the swelling reached past his ankle and up into his groin.  He denies any fevers or chills cough congestion chest pain shortness of breath abdominal pain or changes in urinary or bowel habits.  He follows with [facility] for nail care but has not had any previous debridements.  He reports compliance with all of his medications including his diabetic meds      Physical Exam   Triage Vital Signs: ED Triage Vitals  Encounter Vitals Group     BP 08/15/24 1652 (!) 170/86     Girls Systolic BP Percentile --      Girls Diastolic BP Percentile --      Boys Systolic BP Percentile --      Boys Diastolic BP Percentile --      Pulse Rate 08/15/24 1652 99     Resp 08/15/24 1652 17     Temp 08/15/24 1652 97.6 F (36.4 C)     Temp src --      SpO2 08/15/24 1652 97 %     Weight 08/15/24 1653 275 lb (124.7 kg)     Height 08/15/24 1653 5' 11 (1.803 m)     Head Circumference --      Peak Flow --      Pain Score 08/15/24 1653 0     Pain Loc --      Pain Education --      Exclude from Growth Chart --     Most recent vital signs: Vitals:   08/15/24 1652  BP: (!) 170/86  Pulse: 99  Resp: 17  Temp: 97.6 F (36.4 C)  SpO2: 97%    Nursing Triage Note reviewed. Vital signs reviewed and patients oxygen saturation is normoxic  General: Patient is well nourished, well  developed, awake and alert, rHead: Normocephalic and atraumatic Eyes: Normal inspection, extraocular muscles intact, no conjunctival pallor Ear, nose, throat: Normal external exam Neck: Normal range of motion Respiratory: Patient is in no respiratory distress, lungs CTAB Cardiovascular: Patient is not tachycardic, RRR without murmur appreciated GI: Abd SNT with no guarding or rebound  Back: Normal inspection of the back with good strength and range of motion throughout all ext Extremities: pulses intact with good cap refills, RLE:  Patient has 2+ DP pulse marked with less than 2-second cap refill in all toes    Neuro: The patient is alert and oriented to person, place, and time, appropriately conversive, with 5/5 bilat UE/LE strength, no gross motor or sensory defects noted. Coordination appears to be adequate. Psych: normal mood and affect, no SI or HI  ED Results / Procedures / Treatments   Labs (all labs ordered are listed, but only abnormal results are displayed) Labs Reviewed  CBC WITH DIFFERENTIAL/PLATELET - Abnormal; Notable for the following components:      Result  Value   Hemoglobin 12.9 (*)    HCT 37.9 (*)    All other components within normal limits  COMPREHENSIVE METABOLIC PANEL WITH GFR - Abnormal; Notable for the following components:   Glucose, Bld 175 (*)    All other components within normal limits  CULTURE, BLOOD (ROUTINE X 2)  CULTURE, BLOOD (ROUTINE X 2)  LACTIC ACID, PLASMA     EKG None  RADIOLOGY Xray right foot: No acute bony abnormality or gas tracking on my independent review interpretation radiologist agrees Venous ultrasound: No DVT and radiologist reads this as inguinal lymphadenopathy MRI foot with and without contrast: Pending    PROCEDURES:  Critical Care performed: No  Procedures   MEDICATIONS ORDERED IN ED: Medications  vancomycin  (VANCOREADY) IVPB 2000 mg/400 mL (2,000 mg Intravenous New Bag/Given 08/15/24 1855)   piperacillin -tazobactam (ZOSYN ) IVPB 3.375 g (has no administration in time range)     IMPRESSION / MDM / ASSESSMENT AND PLAN / ED COURSE                                Differential diagnosis includes, but is not limited to, diabetic ulceration, sepsis, osteomyelitis, anemia, DKA  ED course: Patient presents with 1 week of progressively worsening right great toe discoloration and pain with surrounding swelling after injury at work.  I did consider possible arterial occlusion initially however patient has bounding DP pulses and toes appear well-perfused (we did mark the DP pulse for everyone's reference).  I did obtain a Doppler ultrasound as patient does have a prior history of DVT and this was unremarkable.  X-ray demonstrated no bony erosions or gas tracking.  He was given blood cultures and vancomycin  was started.  He has no leukocytosis no lactic acid and no profound electrolyte derangements.  His case was discussed with on-call podiatrist Dr. Janit who reviewed the pictures and did request an MRI of the foot with n.p.o. at midnight for possible intervention in the morning.  He did recommend adding on Zosyn  which has been ordered.  Will discuss the case for admission with hospitalist   Clinical Course as of 08/15/24 1951  Fri Aug 15, 2024  8084 Case discussed with Dr. Janit of podiatry.  He request Banken Zosyn .  MRI of the right foot to evaluate for osteo and n.p.o. at midnight.  Will update the patient and consult hospitalist [HD]  1950 Case discussed with hospitalist for admission [HD]    Clinical Course User Index [HD] Nicholaus Rolland BRAVO, MD   -- Risk: 5 This patient has a high risk of morbidity due to further diagnostic testing or treatment. Rationale: This patient's evaluation and management involve a high risk of morbidity due to the potential severity of presenting symptoms, need for diagnostic testing, and/or initiation of treatment that may require close monitoring. The  differential includes conditions with potential for significant deterioration or requiring escalation of care. Treatment decisions in the ED, including medication administration, procedural interventions, or disposition planning, reflect this level of risk. COPA: 5 The patient has the following acute or chronic illness/injury that poses a possible threat to life or bodily function: [X] : The patient has a potentially serious acute condition or an acute exacerbation of a chronic illness requiring urgent evaluation and management in the Emergency Department. The clinical presentation necessitates immediate consideration of life-threatening or function-threatening diagnoses, even if they are ultimately ruled out.   FINAL CLINICAL IMPRESSION(S) / ED DIAGNOSES   Final  diagnoses:  Diabetic ulcer of right great toe (HCC)  Cellulitis of foot     Rx / DC Orders   ED Discharge Orders     None        Note:  This document was prepared using Dragon voice recognition software and may include unintentional dictation errors.   Nicholaus Rolland BRAVO, MD 08/15/24 [phone]

## 2024-08-15 NOTE — Assessment & Plan Note (Signed)
Continue basal insulin.  Sliding scale insulin coverage

## 2024-08-15 NOTE — Assessment & Plan Note (Addendum)
 BMI 38.35 but with comorbidities Complicating factor to overall prognosis and care Currently on Ozempic

## 2024-08-15 NOTE — Assessment & Plan Note (Signed)
 Infection right great toe Pain controlled Continue Zosyn  and vancomycin  N.p.o. for debridement in the ED by podiatry, TFA Formal consult placed.

## 2024-08-15 NOTE — Assessment & Plan Note (Signed)
Continue home meds pending med rec 

## 2024-08-15 NOTE — ED Notes (Signed)
Report given to Eboni RN

## 2024-08-15 NOTE — H&P (Signed)
 History and Physical    Patient: Robert Brennan FMW:[id number] DOB: 07-02-1971 DOA: 08/15/2024 DOS: the patient was seen and examined on 08/15/2024 PCP: Jannetta Donnice Lenis, MD  Patient coming from: Home  Chief Complaint:  Chief Complaint  Patient presents with   Foot Swelling    HPI: Robert Brennan is a 53 y.o. male with medical history significant for Type II IDDM with neuropathy, HTN, gout, prior DVT morbid obesity, being admitted with cellulitis right foot from infected wound right great toe  with plans for podiatry intervention on 07/16/2024. Patient reports a prior injury to his right great toe and over the past week has noted worsening swelling to the area, now with redness and streaking extending proximally on the foot up to the calf.  He denies fever or chills. In the ED, Tmax 99.4 pulse in the 80s to 90s with blood pressure 170/86 on arrival. Labs notable for normal WBC of 7.3 and normal lactic acid of 1.5.  Hemoglobin 12.9 with no recent baseline.  CMP unremarkable.  Last A1c on record From May 2025 showed very good diabetic control at 5.4. Venous ultrasound negative for DVT MRI done, result pending  The ED provider spoke with podiatrist, Dr. Janit who would like to take patient for debridement in the a.m. and recommended n.p.o. from midnight and vancomycin  and Zosyn .  Admission requested.     Past Medical History:  Diagnosis Date   Azoospermia    Gout    per pt stable as of 03-24-2016   Hypertension    Hypogonadism, testicular    Type 2 diabetes mellitus (HCC)    Wears glasses    Past Surgical History:  Procedure Laterality Date   BREAST REDUCTION SURGERY  age 89 and 19   COLONOSCOPY  2011   GRAFT APPLICATION Right 12/15/2021   Procedure: GRAFT APPLICATION;  Surgeon: Tye Millet, DO;  Location: [facility];  Service: General;  Laterality: Right;   SKIN SPLIT GRAFT Right 03/07/2022   Procedure: SKIN GRAFT SPLIT THICKNESS;  Surgeon: Elisabeth Craig RAMAN, MD;  Location: [facility];  Service: Plastics;  Laterality: Right;   TESTICLE BIOPSY N/A 04/04/2016   Procedure: BIOPSY TESTICULAR;  Surgeon: Donnice Brooks, MD;  Location: [facility];  Service: Urology;  Laterality: N/A;   WOUND DEBRIDEMENT Right 11/02/2021   Procedure: DEBRIDEMENT WOUND;  Surgeon: Tye Millet, DO;  Location: [facility];  Service: General;  Laterality: Right;   Social History:  reports that he has never smoked. He has never used smokeless tobacco. He reports that he does not drink alcohol and does not use drugs.  No Known Allergies  Family History  Problem Relation Age of Onset   Arthritis Mother    Hypertension Mother    Cancer Father     Prior to Admission medications   Medication Sig Start Date End Date Taking? Authorizing Provider  acetaminophen  (TYLENOL ) 500 MG tablet Take 500-1,000 mg by mouth every 6 (six) hours as needed (for pain.).   Yes [provider]  allopurinol  (ZYLOPRIM ) 100 MG tablet Take 200 mg by mouth in the morning. 09/06/21  Yes [provider]  amLODipine  (NORVASC ) 10 MG tablet Take 10 mg by mouth in the morning. 09/06/21  Yes [provider]  atorvastatin  (LIPITOR) 10 MG tablet Take 10 mg by mouth at bedtime. 10/23/21  Yes [provider]  doxazosin  (CARDURA ) 4 MG tablet Take 4 mg by mouth at bedtime. 06/24/24  Yes [provider]  empagliflozin  (JARDIANCE ) 25  MG TABS tablet Take 1 tablet by mouth daily. 02/06/23  Yes [provider]  metFORMIN (GLUCOPHAGE) 1000 MG tablet Take 1,000 mg by mouth 2 (two) times daily. 08/24/21  Yes [provider]  metoprolol  succinate (TOPROL -XL) 100 MG 24 hr tablet Take 100 mg by mouth at bedtime. 07/23/24  Yes [provider]  OZEMPIC, 0.25 OR 0.5 MG/DOSE, 2 MG/3ML SOPN Inject 0.5 mg into the skin once a week. 03/19/24  Yes [provider]  tadalafil (CIALIS) 5 MG tablet Take 5 mg by mouth daily as needed for erectile dysfunction.    Yes [provider]  TRESIBA FLEXTOUCH 200 UNIT/ML FlexTouch Pen Inject 54 Units into the skin in the morning and at bedtime. 10/27/21  Yes [provider]    Physical Exam: Vitals:   08/15/24 1653 08/15/24 1858 08/15/24 2000 08/15/24 2057  BP:  137/86 (!) 155/87   Pulse:  86 83   Resp:      Temp:    99.4 F (37.4 C)  TempSrc:    Oral  SpO2:  100% 99%   Weight: 124.7 kg     Height: 5' 11 (1.803 m)      Physical Exam Vitals and nursing note reviewed.  Constitutional:      General: He is not in acute distress. HENT:     Head: Normocephalic and atraumatic.  Cardiovascular:     Rate and Rhythm: Normal rate and regular rhythm.     Heart sounds: Normal heart sounds.  Pulmonary:     Effort: Pulmonary effort is normal.     Breath sounds: Normal breath sounds.  Abdominal:     Palpations: Abdomen is soft.     Tenderness: There is no abdominal tenderness.  Musculoskeletal:     Comments: See photos  Neurological:     Mental Status: Mental status is at baseline.        Labs on Admission: I have personally reviewed following labs and imaging studies  CBC: Recent Labs  Lab 08/15/24 1659  WBC 7.3  NEUTROABS 5.1  HGB 12.9*  HCT 37.9*  MCV 88.3  PLT 193   Basic Metabolic Panel: Recent Labs  Lab 08/15/24 1659  NA 138  K 4.0  CL 102  CO2 24  GLUCOSE 175*  BUN 15  CREATININE 1.18  CALCIUM  9.2   GFR: Estimated Creatinine Clearance: 95.1 mL/min (by C-G formula based on SCr of 1.18 mg/dL). Liver Function Tests: Recent Labs  Lab 08/15/24 1659  AST 20  ALT 20  ALKPHOS 60  BILITOT 1.1  PROT 7.7  ALBUMIN 4.1   No results for input(s): LIPASE, AMYLASE in the last 168 hours. No results for input(s): AMMONIA in the last 168 hours. Coagulation Profile: No results for input(s): INR, PROTIME in the last 168 hours. Cardiac Enzymes: No results for input(s): CKTOTAL, CKMB, CKMBINDEX, TROPONINI in the last 168 hours. BNP (last 3  results) No results for input(s): PROBNP in the last 8760 hours. HbA1C: No results for input(s): HGBA1C in the last 72 hours. CBG: No results for input(s): GLUCAP in the last 168 hours. Lipid Profile: No results for input(s): CHOL, HDL, LDLCALC, TRIG, CHOLHDL, LDLDIRECT in the last 72 hours. Thyroid Function Tests: No results for input(s): TSH, T4TOTAL, FREET4, T3FREE, THYROIDAB in the last 72 hours. Anemia Panel: No results for input(s): VITAMINB12, FOLATE, FERRITIN, TIBC, IRON, RETICCTPCT in the last 72 hours. Urine analysis:    Component Value Date/Time   COLORURINE STRAW (A) 08/15/2015 1333  APPEARANCEUR CLEAR (A) 08/15/2015 1333   LABSPEC 1.029 08/15/2015 1333   PHURINE 6.0 08/15/2015 1333   GLUCOSEU >500 (A) 08/15/2015 1333   HGBUR NEGATIVE 08/15/2015 1333   BILIRUBINUR NEGATIVE 08/15/2015 1333   KETONESUR TRACE (A) 08/15/2015 1333   PROTEINUR NEGATIVE 08/15/2015 1333   NITRITE NEGATIVE 08/15/2015 1333   LEUKOCYTESUR NEGATIVE 08/15/2015 1333    Radiological Exams on Admission: DG Foot Complete Right Result Date: 08/15/2024 EXAM: 3 OR MORE VIEW(S) XRAY OF THE RIGHT FOOT 08/15/2024 06:47:52 PM COMPARISON: None available. CLINICAL HISTORY: evaluate for gas tracking FINDINGS: BONES AND JOINTS: Advanced arthritic changes in the 1st MTP joint with joint space narrowing and spurring. Probable old healed 3rd metatarsal fracture. No acute fracture. No focal osseous lesion. No joint dislocation. SOFT TISSUES: The soft tissues are unremarkable. IMPRESSION: 1. No acute bony abnormality. Electronically signed by: Franky Crease MD 08/15/2024 07:01 PM EST RP Workstation: [id number]   US  Venous Img Lower Unilateral Right Result Date: 08/15/2024 CLINICAL DATA:  Swelling. EXAM: RIGHT LOWER EXTREMITY VENOUS DOPPLER ULTRASOUND TECHNIQUE: Gray-scale sonography with compression, as well as color and duplex ultrasound, were performed to evaluate the deep  venous system(s) from the level of the common femoral vein through the popliteal and proximal calf veins. COMPARISON:  None Available. FINDINGS: VENOUS Normal compressibility of the common femoral, superficial femoral, and popliteal veins, as well as the visualized calf veins. Note is made that there is limited evaluation of the calf veins. Visualized portions of profunda femoral vein and great saphenous vein unremarkable. No filling defects to suggest DVT on grayscale or color Doppler imaging. Doppler waveforms show normal direction of venous flow, normal respiratory plasticity and response to augmentation. Limited views of the contralateral common femoral vein are unremarkable. OTHER There are enlarged right inguinal lymph nodes measuring up to 1.7 cm short axis. Limitations: none IMPRESSION: 1. No evidence of deep venous thrombosis in the right lower extremity. 2. Enlarged right inguinal lymph nodes measuring up to 1.7 cm short axis. Electronically Signed   By: Greig Pique M.D.   On: 08/15/2024 18:57   Data Reviewed for HPI: Relevant notes from primary care and specialist visits, past discharge summaries as available in EHR, including Care Everywhere. Prior diagnostic testing as pertinent to current admission diagnoses Updated medications and problem lists for reconciliation ED course, including vitals, labs, imaging, treatment and response to treatment Triage notes, nursing and pharmacy notes and ED provider's notes Notable results as noted above in HPI      Assessment and Plan: * Cellulitis of right foot Infection right great toe Pain controlled Continue Zosyn  and vancomycin  N.p.o. for debridement in the ED by podiatry, TFA Formal consult placed.  Obesity, Class III, BMI 40-49.9 (morbid obesity) (HCC) BMI 38.35 but with comorbidities Complicating factor to overall prognosis and care Currently on Ozempic  Essential hypertension Continue home meds pending med rec  Gout Acute  exacerbation not suspected  Type 2 diabetes mellitus treated with insulin  (HCC) Continue basal insulin  Sliding scale insulin  coverage    DVT prophylaxis: SCD  Consults: TFA podiatry  Advance Care Planning: full code  Family Communication: none  Disposition Plan: Back to previous home environment  Severity of Illness: The appropriate patient status for this patient is OBSERVATION. Observation status is judged to be reasonable and necessary in order to provide the required intensity of service to ensure the patient's safety. The patient's presenting symptoms, physical exam findings, and initial radiographic and laboratory data in the context of their medical condition is  felt to place them at decreased risk for further clinical deterioration. Furthermore, it is anticipated that the patient will be medically stable for discharge from the hospital within 2 midnights of admission.   Author: Delayne LULLA Solian, MD 08/15/2024 9:09 PM  For on call review [email or web address].

## 2024-08-15 NOTE — ED Notes (Signed)
 Ultrasound is at bedside

## 2024-08-15 NOTE — Assessment & Plan Note (Signed)
 Acute exacerbation not suspected

## 2024-08-16 DIAGNOSIS — L02611 Cutaneous abscess of right foot: Secondary | ICD-10-CM

## 2024-08-16 DIAGNOSIS — L03115 Cellulitis of right lower limb: Secondary | ICD-10-CM | POA: Diagnosis not present

## 2024-08-16 DIAGNOSIS — M86171 Other acute osteomyelitis, right ankle and foot: Secondary | ICD-10-CM

## 2024-08-16 LAB — GLUCOSE, CAPILLARY
Glucose-Capillary: 120 mg/dL — ABNORMAL HIGH (ref 70–99)
Glucose-Capillary: 115 mg/dL — ABNORMAL HIGH (ref 70–99)
Glucose-Capillary: 154 mg/dL — ABNORMAL HIGH (ref 70–99)
Glucose-Capillary: 186 mg/dL — ABNORMAL HIGH (ref 70–99)
Glucose-Capillary: 161 mg/dL — ABNORMAL HIGH (ref 70–99)

## 2024-08-16 LAB — HEMOGLOBIN A1C
Hgb A1c MFr Bld: 6.4 % — ABNORMAL HIGH (ref 4.8–5.6)
Mean Plasma Glucose: 136.98 mg/dL

## 2024-08-16 LAB — HIV ANTIBODY (ROUTINE TESTING W REFLEX): HIV Screen 4th Generation wRfx: NONREACTIVE

## 2024-08-16 NOTE — Progress Notes (Signed)
 Progress Note   Patient: Robert Brennan FMW:[id number] DOB: 05-21-1971 DOA: 08/15/2024     1 DOS: the patient was seen and examined on 08/16/2024   Brief hospital course:  Robert Brennan is a 53 y.o. male with medical history significant for Type II IDDM with neuropathy, HTN, gout, prior DVT morbid obesity, being admitted with cellulitis right foot from infected wound right great toe  with plans for podiatry intervention on 07/16/2024. Patient reports a prior injury to his right great toe and over the past week has noted worsening swelling to the area, now with redness and streaking extending proximally on the foot up to the calf.  He denies fever or chills. In the ED, Tmax 99.4 pulse in the 80s to 90s with blood pressure 170/86 on arrival. Labs notable for normal WBC of 7.3 and normal lactic acid of 1.5.  Hemoglobin 12.9 with no recent baseline.  CMP unremarkable.  Last A1c on record From May 2025 showed very good diabetic control at 5.4. Venous ultrasound negative for DVT MRI done, result pending   The ED provider spoke with podiatrist, Dr. Janit who would like to take patient for debridement in the a.m. and recommended n.p.o. from midnight and vancomycin  and Zosyn .   Assessment and Plan:  Acute osteomyelitis involving right great toe Cellulitis of right foot Patient presents to the emergency room for evaluation of swelling involving the right great toe with redness and differential warmth MRI of the right foot shows findings concerning for osteomyelitis in the distal phalanx of the great toe. Advanced inflammatory arthropathy of the 1st MTP joint. Diffuse soft tissue edema about the dorsum and medial aspect of the forefoot. Continue empiric antibiotic therapy with Zosyn  and vancomycin  Appreciate podiatry input, plan is for amputation of right great toe with bone biopsy.  As well as incision and drainage abscess right foot.     Obesity, Class II BMI 38.35 but with comorbidities Complicating  factor to overall prognosis and care Currently on Ozempic Lifestyle modification and exercise discussed with patient in detail    Essential hypertension Continue amlodipine  and metoprolol    Gout Acute exacerbation not suspected    Type 2 diabetes mellitus treated with insulin  (HCC) Last hemoglobin A1c 6.4 Continue basal insulin  with NovoLog  sliding scale coverage Continue consistent carbohydrate diet          Subjective: No new complaints  Physical Exam: Vitals:   08/15/24 2057 08/15/24 2122 08/16/24 0444 08/16/24 0811  BP:  (!) 146/47 133/85 (!) 140/83  Pulse:  87 77 77  Resp:  18 16 16   Temp: 99.4 F (37.4 C) 99.3 F (37.4 C) 98.2 F (36.8 C) 98.8 F (37.1 C)  TempSrc: Oral     SpO2:  100% 99% 97%  Weight:      Height:       Vitals and nursing note reviewed.  Constitutional:      General: He is not in acute distress. HENT:     Head: Normocephalic and atraumatic.  Cardiovascular:     Rate and Rhythm: Normal rate and regular rhythm.     Heart sounds: Normal heart sounds.  Pulmonary:     Effort: Pulmonary effort is normal.     Breath sounds: Normal breath sounds.  Abdominal:     Palpations: Abdomen is soft.     Tenderness: There is no abdominal tenderness.  Musculoskeletal:     Comments: See photos  Neurological:     Mental Status: Mental status is at baseline.  Data Reviewed: Labs reviewed.  Hemoglobin A1c 6.4 Labs reviewed  Family Communication: Plan of care was discussed with patient at the bedside.  He verbalizes understanding and agrees with the plan  Disposition: Status is: Inpatient Remains inpatient appropriate because: For amputation of right great toe in a.m.  Planned Discharge Destination: TBD    Time spent: 50 minutes  Author: Aimee Somerset, MD 08/16/2024 12:00 PM  For on call review [email or web address].

## 2024-08-16 NOTE — H&P (View-Only) (Signed)
 Progress Note   Patient: Robert Brennan FMW:[id number] DOB: 05-21-1971 DOA: 08/15/2024     1 DOS: the patient was seen and examined on 08/16/2024   Brief hospital course:  Rajah Tagliaferro is a 53 y.o. male with medical history significant for Type II IDDM with neuropathy, HTN, gout, prior DVT morbid obesity, being admitted with cellulitis right foot from infected wound right great toe  with plans for podiatry intervention on 07/16/2024. Patient reports a prior injury to his right great toe and over the past week has noted worsening swelling to the area, now with redness and streaking extending proximally on the foot up to the calf.  He denies fever or chills. In the ED, Tmax 99.4 pulse in the 80s to 90s with blood pressure 170/86 on arrival. Labs notable for normal WBC of 7.3 and normal lactic acid of 1.5.  Hemoglobin 12.9 with no recent baseline.  CMP unremarkable.  Last A1c on record From May 2025 showed very good diabetic control at 5.4. Venous ultrasound negative for DVT MRI done, result pending   The ED provider spoke with podiatrist, Dr. Janit who would like to take patient for debridement in the a.m. and recommended n.p.o. from midnight and vancomycin  and Zosyn .   Assessment and Plan:  Acute osteomyelitis involving right great toe Cellulitis of right foot Patient presents to the emergency room for evaluation of swelling involving the right great toe with redness and differential warmth MRI of the right foot shows findings concerning for osteomyelitis in the distal phalanx of the great toe. Advanced inflammatory arthropathy of the 1st MTP joint. Diffuse soft tissue edema about the dorsum and medial aspect of the forefoot. Continue empiric antibiotic therapy with Zosyn  and vancomycin  Appreciate podiatry input, plan is for amputation of right great toe with bone biopsy.  As well as incision and drainage abscess right foot.     Obesity, Class II BMI 38.35 but with comorbidities Complicating  factor to overall prognosis and care Currently on Ozempic Lifestyle modification and exercise discussed with patient in detail    Essential hypertension Continue amlodipine  and metoprolol    Gout Acute exacerbation not suspected    Type 2 diabetes mellitus treated with insulin  (HCC) Last hemoglobin A1c 6.4 Continue basal insulin  with NovoLog  sliding scale coverage Continue consistent carbohydrate diet          Subjective: No new complaints  Physical Exam: Vitals:   08/15/24 2057 08/15/24 2122 08/16/24 0444 08/16/24 0811  BP:  (!) 146/47 133/85 (!) 140/83  Pulse:  87 77 77  Resp:  18 16 16   Temp: 99.4 F (37.4 C) 99.3 F (37.4 C) 98.2 F (36.8 C) 98.8 F (37.1 C)  TempSrc: Oral     SpO2:  100% 99% 97%  Weight:      Height:       Vitals and nursing note reviewed.  Constitutional:      General: He is not in acute distress. HENT:     Head: Normocephalic and atraumatic.  Cardiovascular:     Rate and Rhythm: Normal rate and regular rhythm.     Heart sounds: Normal heart sounds.  Pulmonary:     Effort: Pulmonary effort is normal.     Breath sounds: Normal breath sounds.  Abdominal:     Palpations: Abdomen is soft.     Tenderness: There is no abdominal tenderness.  Musculoskeletal:     Comments: See photos  Neurological:     Mental Status: Mental status is at baseline.  Data Reviewed: Labs reviewed.  Hemoglobin A1c 6.4 Labs reviewed  Family Communication: Plan of care was discussed with patient at the bedside.  He verbalizes understanding and agrees with the plan  Disposition: Status is: Inpatient Remains inpatient appropriate because: For amputation of right great toe in a.m.  Planned Discharge Destination: TBD    Time spent: 50 minutes  Author: Aimee Somerset, MD 08/16/2024 12:00 PM  For on call review [email or web address].

## 2024-08-16 NOTE — Plan of Care (Signed)

## 2024-08-16 NOTE — Consult Note (Signed)
 PODIATRY CONSULTATION  NAME Robert Brennan MRN [id number] DOB 11/21/1970 DOA 08/15/2024   Reason for consult: Diabetic RT great toe infection Chief Complaint  Patient presents with   Foot Swelling    Consulting physician: Rolland Moats MD, [facility] ED  History of present illness: 53 y.o. male PMHx Type II IDDM with neuropathy, HTN, gout, prior DVT morbid obesity, being admitted with cellulitis right foot from infected wound right great toe. Podiatry consulted. Currently NPO. MRI completed.   Past Medical History:  Diagnosis Date   Azoospermia    Gout    per pt stable as of 03-24-2016   Hypertension    Hypogonadism, testicular    Type 2 diabetes mellitus (HCC)    Wears glasses        Latest Ref Rng & Units 08/15/2024    4:59 PM 04/04/2016    8:04 AM 08/15/2015    1:33 PM  CBC  WBC 4.0 - 10.5 K/uL 7.3   6.3   Hemoglobin 13.0 - 17.0 g/dL 87.0  85.6  84.3   Hematocrit 39.0 - 52.0 % 37.9  42.0  46.1   Platelets 150 - 400 K/uL 193   191        Latest Ref Rng & Units 08/15/2024    4:59 PM 12/13/2021    3:12 PM 04/04/2016    8:04 AM  BMP  Glucose 70 - 99 mg/dL 824  872  697   BUN 6 - 20 mg/dL 15  14  17    Creatinine 0.61 - 1.24 mg/dL 8.81  8.83  8.99   Sodium 135 - 145 mmol/L 138  137  138   Potassium 3.5 - 5.1 mmol/L 4.0  4.2  3.4   Chloride 98 - 111 mmol/L 102  103  96   CO2 22 - 32 mmol/L 24  28    Calcium  8.9 - 10.3 mg/dL 9.2  9.3      Draining sinus to the distal aspect of the toe. Heavy purulent blister noted. Erythema and edema diffusely to the foot. Palpable pedal pulses bilaterally. Well perfused. No concern clinically for ischemia or vascular compromise. No prior ulcers. No prior amputations or history of complications to the feet. Patient ambulatory   MR FOOT RIGHT W WO CONTRAST (Accession [id number]) (Order [id number]) Imaging Date: 08/15/2024 Department: [facility] ORTHOPEDICS (1A) Released By/Authorizing: Moats Rolland BRAVO, MD  (auto-released)  IMPRESSION: 1. Findings concerning for osteomyelitis in the distal phalanx of the great toe. 2. Advanced inflammatory arthropathy of the 1st MTP joint. 3. Diffuse soft tissue edema about the dorsum and medial aspect of the forefoot. 4. The urgent finding will be called to the ordering provider by the Professional Radiology Assistants (PRAs) and documented in the [facility] dashboard.  DG Foot Complete Right (Accession [id number]) (Order [id number]) Imaging Date: 08/15/2024 Department: [facility] ORTHOPEDICS (1A) Released By/Authorizing: Moats Rolland BRAVO, MD (auto-released)  IMPRESSION: 1. No acute bony abnormality.   ASSESSMENT/PLAN OF CARE Osteomyelitis right great toe w/ abscess Diabetes mellitus; controlled w/ peripheral neuropathy  -patient seen this am.  -discussed findings of OM on MRI warranting toe amputation. Patient understands and is amenable to the plan.  -Surgery will consist of:   amputation right great toe with bone biopsy.   Incision and drainage abscess right foot.  -Okay to eat today. Orders changed. NPO after midnight. -Okay for WBAT  -Will follow   Thank you for the consult.  Please contact me directly via secure chat with any  questions or concerns.     Thresa EMERSON Sar, DPM [facility]  Dr. Thresa EMERSON Sar, DPM    [address] [place], [place] [address]                Office [phone]  Fax [phone]

## 2024-08-17 ENCOUNTER — Inpatient Hospital Stay: Payer: PRIVATE HEALTH INSURANCE | Admitting: General Practice

## 2024-08-17 ENCOUNTER — Encounter: Admission: EM | Disposition: A | Payer: Self-pay | Source: Home / Self Care | Attending: Internal Medicine

## 2024-08-17 ENCOUNTER — Inpatient Hospital Stay: Payer: PRIVATE HEALTH INSURANCE

## 2024-08-17 DIAGNOSIS — L02611 Cutaneous abscess of right foot: Secondary | ICD-10-CM

## 2024-08-17 DIAGNOSIS — L03115 Cellulitis of right lower limb: Secondary | ICD-10-CM | POA: Diagnosis not present

## 2024-08-17 DIAGNOSIS — M86171 Other acute osteomyelitis, right ankle and foot: Secondary | ICD-10-CM

## 2024-08-17 LAB — GLUCOSE, CAPILLARY
Glucose-Capillary: 116 mg/dL — ABNORMAL HIGH (ref 70–99)
Glucose-Capillary: 158 mg/dL — ABNORMAL HIGH (ref 70–99)
Glucose-Capillary: 161 mg/dL — ABNORMAL HIGH (ref 70–99)
Glucose-Capillary: 164 mg/dL — ABNORMAL HIGH (ref 70–99)
Glucose-Capillary: 98 mg/dL (ref 70–99)
Glucose-Capillary: 98 mg/dL (ref 70–99)

## 2024-08-17 MED ORDER — MIDAZOLAM HCL 5 MG/5ML IJ SOLN
INTRAMUSCULAR | Status: DC | PRN
  Administered 2024-08-17: 2 mg via INTRAVENOUS

## 2024-08-17 MED ORDER — SODIUM CHLORIDE 0.9 % IV SOLN
3.0000 g | Freq: Four times a day (QID) | INTRAVENOUS | Status: DC
  Administered 2024-08-17 – 2024-08-18 (×4): 3 g via INTRAVENOUS
  Filled 2024-08-17 (×6): qty 8

## 2024-08-17 MED ORDER — OXYCODONE HCL 5 MG PO TABS: 5.0000 mg | ORAL_TABLET | Freq: Once | ORAL | Status: DC | PRN

## 2024-08-17 MED ORDER — DEXMEDETOMIDINE HCL IN NACL 80 MCG/20ML IV SOLN
INTRAVENOUS | Status: DC | PRN
  Administered 2024-08-17: 10 ug via INTRAVENOUS

## 2024-08-17 MED ORDER — GLYCOPYRROLATE 0.2 MG/ML IJ SOLN
INTRAMUSCULAR | Status: DC | PRN
Start: 2024-08-17 — End: 2024-08-17
  Administered 2024-08-17: .2 mg via INTRAVENOUS

## 2024-08-17 MED ORDER — LIDOCAINE HCL (PF) 2 % IJ SOLN
INTRAMUSCULAR | Status: AC
  Filled 2024-08-17: qty 5

## 2024-08-17 MED ORDER — MIDAZOLAM HCL 2 MG/2ML IJ SOLN
INTRAMUSCULAR | Status: AC
  Filled 2024-08-17: qty 2

## 2024-08-17 MED ORDER — GLYCOPYRROLATE 0.2 MG/ML IJ SOLN
INTRAMUSCULAR | Status: AC
  Filled 2024-08-17: qty 1

## 2024-08-17 MED ORDER — LIDOCAINE HCL (PF) 1 % IJ SOLN
INTRAMUSCULAR | Status: DC | PRN
  Administered 2024-08-17: 20 mL

## 2024-08-17 MED ORDER — FENTANYL CITRATE (PF) 100 MCG/2ML IJ SOLN
INTRAMUSCULAR | Status: DC | PRN
  Administered 2024-08-17 (×2): 50 ug via INTRAVENOUS

## 2024-08-17 MED ORDER — SODIUM CHLORIDE 0.9 % IV SOLN: INTRAVENOUS | Status: DC | PRN

## 2024-08-17 MED ORDER — LIDOCAINE 2% (20 MG/ML) 5 ML SYRINGE
INTRAMUSCULAR | Status: DC | PRN
  Administered 2024-08-17: 50 mg via INTRAVENOUS

## 2024-08-17 MED ORDER — 0.9 % SODIUM CHLORIDE (POUR BTL) OPTIME
TOPICAL | Status: DC | PRN
  Administered 2024-08-17: 1000 mL

## 2024-08-17 MED ORDER — FENTANYL CITRATE (PF) 100 MCG/2ML IJ SOLN: 25.0000 ug | INTRAMUSCULAR | Status: DC | PRN

## 2024-08-17 MED ORDER — PROPOFOL 500 MG/50ML IV EMUL
INTRAVENOUS | Status: DC | PRN
  Administered 2024-08-17: 75 ug/kg/min via INTRAVENOUS

## 2024-08-17 MED ORDER — FENTANYL CITRATE (PF) 100 MCG/2ML IJ SOLN
INTRAMUSCULAR | Status: AC
  Filled 2024-08-17: qty 2

## 2024-08-17 MED ORDER — OXYCODONE HCL 5 MG/5ML PO SOLN: 5.0000 mg | Freq: Once | ORAL | Status: DC | PRN

## 2024-08-17 MED ORDER — PROPOFOL 10 MG/ML IV BOLUS
INTRAVENOUS | Status: DC | PRN
  Administered 2024-08-17: 30 mg via INTRAVENOUS

## 2024-08-17 MED ORDER — PROPOFOL 1000 MG/100ML IV EMUL
INTRAVENOUS | Status: AC
  Filled 2024-08-17: qty 100

## 2024-08-17 SURGICAL SUPPLY — 2 items
NDL FILTER BLUNT 18X1 1/2 (NEEDLE) ×1 IMPLANT
NDL HYPO 25X1 1.5 SAFETY (NEEDLE) ×2 IMPLANT

## 2024-08-17 NOTE — Progress Notes (Signed)
 Progress Note   Patient: Robert Brennan FMW:[id number] DOB: May 13, 1971 DOA: 08/15/2024     2 DOS: the patient was seen and examined on 08/17/2024   Brief hospital course:  Robert Brennan is a 53 y.o. male with medical history significant for Type II IDDM with neuropathy, HTN, gout, prior DVT morbid obesity, being admitted with cellulitis right foot from infected wound right great toe  with plans for podiatry intervention on 07/16/2024. Patient reports a prior injury to his right great toe and over the past week has noted worsening swelling to the area, now with redness and streaking extending proximally on the foot up to the calf.  He denies fever or chills. In the ED, Tmax 99.4 pulse in the 80s to 90s with blood pressure 170/86 on arrival. Labs notable for normal WBC of 7.3 and normal lactic acid of 1.5.  Hemoglobin 12.9 with no recent baseline.  CMP unremarkable.  Last A1c on record From May 2025 showed very good diabetic control at 5.4. Venous ultrasound negative for DVT MRI done, result pending   The ED provider spoke with podiatrist, Dr. Janit who would like to take patient for debridement in the a.m. and recommended n.p.o. from midnight and vancomycin  and Zosyn .    Assessment and Plan:  Acute osteomyelitis involving right great toe Cellulitis of right foot Patient presented to the emergency room for evaluation of swelling involving the right great toe with redness and differential warmth MRI of the right foot shows findings concerning for osteomyelitis in the distal phalanx of the great toe. Advanced inflammatory arthropathy of the 1st MTP joint. Diffuse soft tissue edema about the dorsum and medial aspect of the forefoot. Appreciate podiatry input, patient is status post amputation of the right great toe with I&D of right foot abscess. (POD 0) Will continue empiric antibiotic therapy with Unasyn  while I await results of wound cultures PT consult     Obesity, Class II BMI 38.35 but with  comorbidities Complicating factor to overall prognosis and care Currently on Ozempic Lifestyle modification and exercise discussed with patient in detail     Essential hypertension Continue amlodipine  and metoprolol     Gout Acute exacerbation not suspected     Type 2 diabetes mellitus treated with insulin  (HCC) Last hemoglobin A1c 6.4 Continue basal insulin  with NovoLog  sliding scale coverage Continue consistent carbohydrate diet         Subjective: No new complaints.  Physical Exam: Vitals:   08/17/24 0910 08/17/24 0915 08/17/24 0921 08/17/24 0951  BP:  127/81 135/85 138/71  Pulse: 69 70 61 61  Resp: 16 13 15 17   Temp:   (!) 97.5 F (36.4 C) 97.8 F (36.6 C)  TempSrc:      SpO2: 97% 100% 97% 100%  Weight:      Height:       Constitutional:      General: He is not in acute distress. HENT:     Head: Normocephalic and atraumatic.  Cardiovascular:     Rate and Rhythm: Normal rate and regular rhythm.     Heart sounds: Normal heart sounds.  Pulmonary:     Effort: Pulmonary effort is normal.     Breath sounds: Normal breath sounds.  Abdominal:     Palpations: Abdomen is soft.     Tenderness: There is no abdominal tenderness.  Musculoskeletal:     Comments: Dressing over right foot Neurological:     Mental Status: Mental status is at baseline.   Data Reviewed: Hemoglobin A1c 6.4  Labs reviewed  Family Communication: Plan of care discussed with patient at the bedside.  He verbalizes understanding and agrees to the plan.  Disposition: Status is: Inpatient Remains inpatient appropriate because: On IV antibiotics  Planned Discharge Destination: TBD    Time spent: 40 minutes  Author: Aimee Somerset, MD 08/17/2024 11:32 AM  For on call review [email or web address].

## 2024-08-17 NOTE — Transfer of Care (Signed)
 Immediate Anesthesia Transfer of Care Note  Patient: Robert Brennan  Procedure(s) Performed: AMPUTATION, TOE (Right: Toe)  Patient Location: PACU  Anesthesia Type:General  Level of Consciousness: drowsy  Airway & Oxygen Therapy: Patient Spontanous Breathing  Post-op Assessment: Report given to RN and Post -op Vital signs reviewed and stable  Post vital signs: Reviewed  Last Vitals:  Vitals Value Taken Time  BP 104/73 08/17/24 08:53  Temp    Pulse 63 08/17/24 08:56  Resp 14 08/17/24 08:56  SpO2 97 % 08/17/24 08:56  Vitals shown include unfiled device data.  Last Pain:  Vitals:   08/17/24 0347  TempSrc: Oral  PainSc:       Patients Stated Pain Goal: 0 (08/16/24 2051)  Complications: No notable events documented.

## 2024-08-17 NOTE — Plan of Care (Signed)

## 2024-08-17 NOTE — Anesthesia Postprocedure Evaluation (Signed)
 Anesthesia Post Note  Patient: Robert Brennan  Procedure(s) Performed: AMPUTATION, TOE (Right: Toe)  Patient location during evaluation: PACU Anesthesia Type: General Level of consciousness: awake and alert Pain management: pain level controlled Vital Signs Assessment: post-procedure vital signs reviewed and stable Respiratory status: spontaneous breathing, nonlabored ventilation, respiratory function stable and patient connected to nasal cannula oxygen Cardiovascular status: blood pressure returned to baseline and stable Postop Assessment: no apparent nausea or vomiting Anesthetic complications: no   No notable events documented.   Last Vitals:  Vitals:   08/17/24 0921 08/17/24 0951  BP: 135/85 138/71  Pulse: 61 61  Resp: 15 17  Temp: (!) 36.4 C 36.6 C  SpO2: 97% 100%    Last Pain:  Vitals:   08/17/24 1000  TempSrc:   PainSc: 0-No pain                 Debby Mines

## 2024-08-17 NOTE — Brief Op Note (Signed)
 08/17/2024  8:58 AM  PATIENT:  Robert Brennan  53 y.o. male  PRE-OPERATIVE DIAGNOSIS:  Osteomyelitis  POST-OPERATIVE DIAGNOSIS:  S/P right great toe amputation  PROCEDURE:  Procedure(s): AMPUTATION, TOE (Right)  SURGEON:  Surgeons and Role:    DEWAINE Janit Thresa CHRISTELLA, DPM - Primary  PHYSICIAN ASSISTANT: None  ASSISTANTS: none   ANESTHESIA:   local and IV sedation  EBL: Minimal  BLOOD ADMINISTERED:none  DRAINS: none   LOCAL MEDICATIONS USED:  MARCAINE    , LIDOCAINE  , and Amount: 20 ml  SPECIMEN:  Source of Specimen:  1.  Soft tissue mass first MTP right 2.  Bone culture sent in combination with the entire great toe 3.  Bone pathology the base of the proximal phalanx right great toe  DISPOSITION OF SPECIMEN:  PATHOLOGY  COUNTS:  YES  TOURNIQUET:   Total Tourniquet Time Documented: Calf (Right) - 35 minutes Total: Calf (Right) - 35 minutes   DICTATION: .Nechama Dictation  PLAN OF CARE: Admit to inpatient   PATIENT DISPOSITION:  PACU - hemodynamically stable.   Delay start of Pharmacological VTE agent (>24hrs) due to surgical blood loss or risk of bleeding: not applicable  Thresa EMERSON Janit, DPM [facility]  Dr. Thresa EMERSON Janit, DPM    [address] [place], [place] [address]                Office [phone]  Fax [phone]

## 2024-08-17 NOTE — Anesthesia Preprocedure Evaluation (Signed)
 Anesthesia Evaluation  Patient identified by MRN, date of birth, ID band Patient awake    Reviewed: Allergy & Precautions, NPO status , Patient's Chart, lab work & pertinent test results  History of Anesthesia Complications Negative for: history of anesthetic complications  Airway Mallampati: I  TM Distance: >3 FB Neck ROM: Full    Dental  (+) Dental Advisory Given, Teeth Intact   Pulmonary neg pulmonary ROS   Pulmonary exam normal        Cardiovascular hypertension, Pt. on medications Normal cardiovascular exam     Neuro/Psych negative neurological ROS  negative psych ROS   GI/Hepatic negative GI ROS, Neg liver ROS,,,  Endo/Other  diabetes, Type 2, Oral Hypoglycemic Agents, Insulin  Dependent    Renal/GU negative Renal ROS  negative genitourinary   Musculoskeletal   Abdominal   Peds  Hematology negative hematology ROS (+)   Anesthesia Other Findings Patient hasn't taken his GLP 1 for 2 months per patient history.   Past Medical History: No date: Azoospermia No date: Gout     Comment:  per pt stable as of 03-24-2016 No date: Hypertension No date: Hypogonadism, testicular No date: Type 2 diabetes mellitus (HCC) No date: Wears glasses  Past Surgical History: age 23 and 61: BREAST REDUCTION SURGERY 2011: COLONOSCOPY 12/15/2021: GRAFT APPLICATION; Right     Comment:  Procedure: GRAFT APPLICATION;  Surgeon: Tye Millet,               DO;  Location: [facility];  Service: General;  Laterality:               Right; 03/07/2022: SKIN SPLIT GRAFT; Right     Comment:  Procedure: SKIN GRAFT SPLIT THICKNESS;  Surgeon: Elisabeth Craig RAMAN, MD;  Location: [facility];                Service: Plastics;  Laterality: Right; 04/04/2016: TESTICLE BIOPSY; N/A     Comment:  Procedure: BIOPSY TESTICULAR;  Surgeon: Donnice Brooks, MD;  Location: [facility];                 Service: Urology;  Laterality: N/A; 11/02/2021: WOUND DEBRIDEMENT; Right     Comment:  Procedure: DEBRIDEMENT WOUND;  Surgeon: Tye Millet,               DO;  Location: [facility];  Service: General;  Laterality:               Right;  BMI    Body Mass Index: 38.35 kg/m      Reproductive/Obstetrics negative OB ROS                              Anesthesia Physical Anesthesia Plan  ASA: 2  Anesthesia Plan: General   Post-op Pain Management: Minimal or no pain anticipated   Induction: Intravenous  PONV Risk Score and Plan: 2 and Propofol  infusion and TIVA  Airway Management Planned: Nasal Cannula  Additional Equipment: None  Intra-op Plan:   Post-operative Plan:   Informed Consent: I have reviewed the patients History and Physical, chart, labs and discussed the procedure including the risks, benefits and alternatives for the proposed anesthesia with the patient or authorized representative who has indicated his/her understanding and acceptance.  Dental advisory given  Plan Discussed with: CRNA and Surgeon  Anesthesia Plan Comments: (Discussed risks of anesthesia with patient, including possibility of difficulty with spontaneous ventilation under anesthesia necessitating airway intervention, PONV, and rare risks such as cardiac or respiratory or neurological events, and allergic reactions. Discussed the role of CRNA in patient's perioperative care. Patient understands.)        Anesthesia Quick Evaluation

## 2024-08-17 NOTE — Interval H&P Note (Signed)
 History and Physical Interval Note:  08/17/2024 7:59 AM  Robert Brennan  has presented today for surgery, with the diagnosis of Osteomyelitis.  The various methods of treatment have been discussed with the patient and family. After consideration of risks, benefits and other options for treatment, the patient has consented to  Procedure(s): AMPUTATION, TOE (Right) as a surgical intervention.  The patient's history has been reviewed, patient examined, no change in status, stable for surgery.  I have reviewed the patient's chart and labs.  Questions were answered to the patient's satisfaction.     [name]

## 2024-08-17 NOTE — Op Note (Signed)
 OPERATIVE REPORT Patient name: Robert Brennan MRN: [id number] DOB: [birth year]  DOS: 08/17/24  Preop Dx: Osteomyelitis right great toe. Abscess right foot.  Postop Dx: same  Procedure:  1.  Amputation right great toe 2. Incision and drainage of abscess right foot  Surgeon: Thresa EMERSON Sar DPM  Anesthesia: 50-50 mixture of 2% lidocaine  plain with 0.5% Marcaine  plain totaling 10 mL infiltrated in the patient's right foot in combination with IV sedation   Hemostasis: Calf tourniquet inflated to a pressure of after esmarch exsanguination   EBL: Minimal mL Materials: None Injectables: None Pathology:  1.  Soft tissue mass first MTP right 2.  Bone culture sent in combination with the entire great toe 3.  Bone pathology the base of the proximal phalanx right great toe   Condition: The patient tolerated the procedure and anesthesia well. No complications noted or reported   Justification for procedure: The patient presents for surgical correction of osteomyelitis with abscess of the right great toe.  The patient was told benefits as well as possible side effects of the surgery. The patient consented for surgical correction. The patient consent form was reviewed. All patient questions were answered. No guarantees were expressed or implied. The patient and the surgeon both signed the patient consent form placed in the patient's chart.   Procedure in Detail: The patient was brought to the operating room in the supine position at which time an aseptic scrub and drape were performed about the patient's lower extremity after anesthesia was induced as described above. Attention was then directed to the surgical area where procedure commenced.  Procedure #1: Amputation right great toe A fishmouth type incision was planned and made about the base of the infected toe.  Incision was carried down to the level of the MTP joint which time a capsulotomy was performed to disarticulate the joint at the  level of the MTP.  The toe was distracted distally and disarticulated and removed in toto.  Any exposed tendons were distracted distally and cut as far proximal as could be visualized. Any necrotic tissue was excisionally debrided using a #15 scalpel down to healthier margins.    Procedure #2: Incision and drainage with irrigation and debridement right foot   Intraoperatively there was visualized of a large soft tissue mass that was identified on MRI suspected to be soft tissue abscess.  However the mass was well encapsulated and differentiated from the surrounding tissue (intra-op pictures taken in 'media' tab).  Decision was made to carefully resect the mass which was placed in a sterile specimen container and sent to pathology.  Additional irrigation was performed using bulb syringe and normal saline was utilized and after resection of the soft tissue mass the surrounding tissue appeared to be healthy and viable. 4-0 Prolene suture was utilized to reapproximate skin edges for primary closure.  Dry sterile compressive dressings were then applied to all previously mentioned incision sites about the patient's lower extremity. The tourniquet which was used for hemostasis was deflated. All normal neurovascular responses including pink color and warmth returned all the digits of patient's lower extremity.  The patient was then transferred from the operating room to the recovery room having tolerated the procedure and anesthesia well. All vital signs are stable. After a brief stay in the recovery room the patient was readmitted to inpatient room with postoperative orders placed.    IMPRESSION: Osteomyelitis distal phalanx right great toe.  Incidental finding of abnormal soft tissue mass lateral aspect of the first  MTP visualized on MRI originally suspected to be abscess however this is a well encapsulated mass that was sent to pathology.  Leave dressings clean dry and intact x 1 week.  Do not remove.   Minimal weightbearing, heel pressure mostly, in a postop shoe mostly for transition purposes only from the bed to the recliner or the bathroom.  Thresa EMERSON Sar, DPM [facility]  Dr. Thresa EMERSON Sar, DPM    [address] [place], [place] [address]                Office [phone]  Fax [phone]

## 2024-08-18 ENCOUNTER — Encounter: Payer: Self-pay | Admitting: Podiatry

## 2024-08-18 DIAGNOSIS — M86171 Other acute osteomyelitis, right ankle and foot: Secondary | ICD-10-CM | POA: Diagnosis not present

## 2024-08-18 LAB — CBC WITH DIFFERENTIAL/PLATELET
Abs Immature Granulocytes: 0.02 K/uL (ref 0.00–0.07)
Basophils Absolute: 0 K/uL (ref 0.0–0.1)
Basophils Relative: 1 %
Eosinophils Absolute: 0.2 K/uL (ref 0.0–0.5)
Eosinophils Relative: 4 %
HCT: 39.4 % (ref 39.0–52.0)
Hemoglobin: 13.1 g/dL (ref 13.0–17.0)
Immature Granulocytes: 0 %
Lymphocytes Relative: 18 %
Lymphs Abs: 0.9 K/uL (ref 0.7–4.0)
MCH: 29.8 pg (ref 26.0–34.0)
MCHC: 33.2 g/dL (ref 30.0–36.0)
MCV: 89.5 fL (ref 80.0–100.0)
Monocytes Absolute: 0.5 K/uL (ref 0.1–1.0)
Monocytes Relative: 9 %
Neutro Abs: 3.3 K/uL (ref 1.7–7.7)
Neutrophils Relative %: 68 %
Platelets: 187 K/uL (ref 150–400)
RBC: 4.4 MIL/uL (ref 4.22–5.81)
RDW: 12.4 % (ref 11.5–15.5)
WBC: 4.9 K/uL (ref 4.0–10.5)
nRBC: 0 % (ref 0.0–0.2)

## 2024-08-18 LAB — GLUCOSE, CAPILLARY
Glucose-Capillary: 108 mg/dL — ABNORMAL HIGH (ref 70–99)
Glucose-Capillary: 119 mg/dL — ABNORMAL HIGH (ref 70–99)
Glucose-Capillary: 123 mg/dL — ABNORMAL HIGH (ref 70–99)

## 2024-08-18 LAB — BASIC METABOLIC PANEL WITH GFR
Anion gap: 12 (ref 5–15)
BUN: 16 mg/dL (ref 6–20)
CO2: 21 mmol/L — ABNORMAL LOW (ref 22–32)
Calcium: 8.6 mg/dL — ABNORMAL LOW (ref 8.9–10.3)
Chloride: 104 mmol/L (ref 98–111)
Creatinine, Ser: 1.08 mg/dL (ref 0.61–1.24)
GFR, Estimated: >60 mL/min (ref >60)
Glucose, Bld: 183 mg/dL — ABNORMAL HIGH (ref 70–99)
Potassium: 3.7 mmol/L (ref 3.5–5.1)
Sodium: 137 mmol/L (ref 135–145)

## 2024-08-18 MED ORDER — SENNA 8.6 MG PO TABS
2.0000 | ORAL_TABLET | Freq: Every day | ORAL | 0 refills | Status: AC
Start: 2024-08-18 — End: ?

## 2024-08-18 MED ORDER — HYDROCODONE-ACETAMINOPHEN 5-325 MG PO TABS: 1.0000 | ORAL_TABLET | Freq: Four times a day (QID) | ORAL | 0 refills | Status: AC | PRN

## 2024-08-18 MED ORDER — INFLUENZA VIRUS VACC SPLIT PF (FLUZONE) 0.5 ML IM SUSY
0.5000 mL | PREFILLED_SYRINGE | INTRAMUSCULAR | Status: AC
  Administered 2024-08-18: 0.5 mL via INTRAMUSCULAR
  Filled 2024-08-18: qty 0.5

## 2024-08-18 MED ORDER — POLYETHYLENE GLYCOL 3350 17 G PO PACK: 17.0000 g | PACK | Freq: Every day | ORAL | 0 refills | Status: AC

## 2024-08-18 MED ORDER — INFLUENZA VIRUS VACC SPLIT PF (FLUZONE) 0.5 ML IM SUSY: 0.5000 mL | PREFILLED_SYRINGE | INTRAMUSCULAR | Status: DC

## 2024-08-18 MED ORDER — DOXYCYCLINE HYCLATE 100 MG PO TABS: 100.0000 mg | ORAL_TABLET | Freq: Two times a day (BID) | ORAL | 0 refills | Status: AC

## 2024-08-18 NOTE — Progress Notes (Signed)
  Subjective:  Patient ID: Robert Brennan, male    DOB: 05-29-1971,  MRN: [id number]  Chief Complaint  Patient presents with   Foot Swelling    DOS: 08/17/24 Procedure: 1.  Amputation right great toe 2. Incision and drainage of abscess right foot  53 y.o. male seen for post op check. He reports doing well today pain controlled. Discussed findings from surgery and follow up plans.   Review of Systems: Negative except as noted in the HPI. Denies N/V/F/Ch.   Objective:   Constitutional Well developed. Well nourished.  Vascular Foot warm and well perfused. Capillary refill normal to all digits.   No calf pain with palpation  Neurologic Normal speech. Oriented to person, place, and time. Epicritic sensation diminished  Dermatologic Amp site healing well no dehiscence no active bleeding no drainage   Orthopedic: S/p r great toe amputation    Radiographs: Status post amputation of the first digit.   Pathology: Toe and soft tissue mass: pending 1. Soft tissue mass first MTP right 2. Bone culture sent in combination with the entire great toe 3. Bone pathology the base of the proximal phalanx right great toe  Micro: pending  Assessment:   1. Diabetic ulcer of right great toe (HCC)   2. Cellulitis of foot   3. Type 2 diabetes mellitus treated with insulin  (HCC)   Osteomyelitis right great toe, soft tissue mass right foot  Plan:  Patient was evaluated and treated and all questions answered.  POD # 1 s/p 1.  Amputation right great toe 2. Incision and drainage of abscess right foot / soft tissue mass excision -Progressing well post op- dressing had strikethrough so was changed and amp site healing well - Soft tissue mass pathology pending f/u outpatient  -XR: expected changes -WB Status: WBAT in post op shoe -Sutures: remain intact. -Medications/ABX: rec doxycyline for 7 days -Dressing: changed and new xeroform dsg placed - remain intact until follow up - F/u Plan: Follow up next  week Tuesday in [place] office with dr. Janit is scheduled. Stable for dc today. Will sign off.         Marolyn JULIANNA Honour, DPM [facility] / [facility]

## 2024-08-18 NOTE — Plan of Care (Signed)

## 2024-08-18 NOTE — Discharge Summary (Signed)
 Physician Discharge Summary   Patient: Robert Brennan MRN: [id number] DOB: 08-07-1971  Admit date:     08/15/2024  Discharge date: 08/18/24  Discharge Physician: Dayana Dalporto   PCP: Jannetta Donnice Lenis, MD   Recommendations at discharge:   Complete antibiotic therapy as recommended Keep scheduled follow-up appointment with podiatry Check blood sugars daily  Discharge Diagnoses: Principal Problem:   Osteomyelitis of ankle or foot, acute, right [facility]) Active Problems:   Cellulitis of right foot   Type 2 diabetes mellitus treated with insulin  (HCC)   Gout   Essential hypertension   Obesity, Class III, BMI 40-49.9 (morbid obesity) (HCC)  Resolved Problems:   * No resolved hospital problems. *  Hospital Course:  Robert Brennan is a 53 y.o. male with medical history significant for Type II IDDM with neuropathy, HTN, gout, prior DVT morbid obesity, being admitted with cellulitis right foot from infected wound right great toe  with plans for podiatry intervention on 07/16/2024. Patient reports a prior injury to his right great toe and over the past week has noted worsening swelling to the area, now with redness and streaking extending proximally on the foot up to the calf.  He denies fever or chills. In the ED, Tmax 99.4 pulse in the 80s to 90s with blood pressure 170/86 on arrival. Labs notable for normal WBC of 7.3 and normal lactic acid of 1.5.  Hemoglobin 12.9 with no recent baseline.  CMP unremarkable.  Last A1c on record From May 2025 showed very good diabetic control at 5.4. Venous ultrasound negative for DVT MRI done, result pending   The ED provider spoke with podiatrist, Dr. Janit who would like to take patient for debridement in the a.m. and recommended n.p.o. from midnight and vancomycin  and Zosyn .    Assessment and Plan:  Acute osteomyelitis involving right great toe Cellulitis of right foot Patient presented to the emergency room for evaluation of swelling involving the  right great toe with redness and differential warmth MRI of the right foot shows findings concerning for osteomyelitis in the distal phalanx of the great toe. Advanced inflammatory arthropathy of the 1st MTP joint. Diffuse soft tissue edema about the dorsum and medial aspect of the forefoot. Appreciate podiatry input, patient is status post amputation of the right great toe with I&D of right foot abscess. (POD 1) Patient was treated with IV Unasyn  and will be discharged home on a 1 week course of doxycycline  to follow-up with podiatry as an outpatient .     Obesity, Class II BMI 38.35 but with comorbidities Complicating factor to overall prognosis and care Currently on Ozempic Lifestyle modification and exercise discussed with patient in detail     Essential hypertension Continue amlodipine  and metoprolol      Gout Acute exacerbation not suspected     Type 2 diabetes mellitus treated with insulin  (HCC) Last hemoglobin A1c 6.4 Continue consistent carbohydrate diet Continue basal and Premeal insulin  Check blood sugars daily          Consultants: Podiatry Procedures performed:  post amputation of the right great toe with I&D of right foot abscess.  Disposition: Home Diet recommendation:  Cardiac and Carb modified diet DISCHARGE MEDICATION: Allergies as of 08/18/2024   No Known Allergies      Medication List     TAKE these medications    acetaminophen  500 MG tablet Commonly known as: TYLENOL  Take 500-1,000 mg by mouth every 6 (six) hours as needed (for pain.).   allopurinol  100 MG tablet Commonly known  as: ZYLOPRIM  Take 200 mg by mouth in the morning.   amLODipine  10 MG tablet Commonly known as: NORVASC  Take 10 mg by mouth in the morning.   atorvastatin  10 MG tablet Commonly known as: LIPITOR Take 10 mg by mouth at bedtime.   doxazosin  4 MG tablet Commonly known as: CARDURA  Take 4 mg by mouth at bedtime.   doxycycline  100 MG tablet Commonly known as:  VIBRA -TABS Take 1 tablet (100 mg total) by mouth 2 (two) times daily for 7 days.   empagliflozin  25 MG Tabs tablet Commonly known as: JARDIANCE  Take 1 tablet by mouth daily.   HYDROcodone -acetaminophen  5-325 MG tablet Commonly known as: NORCO/VICODIN Take 1 tablet by mouth every 6 (six) hours as needed for up to 5 days for moderate pain (pain score 4-6).   metFORMIN 1000 MG tablet Commonly known as: GLUCOPHAGE Take 1,000 mg by mouth 2 (two) times daily.   metoprolol  succinate 100 MG 24 hr tablet Commonly known as: TOPROL -XL Take 100 mg by mouth at bedtime.   Ozempic (0.25 or 0.5 MG/DOSE) 2 MG/3ML Sopn Generic drug: Semaglutide(0.25 or 0.5MG /DOS) Inject 0.5 mg into the skin once a week.   polyethylene glycol 17 g packet Commonly known as: MiraLax  Take 17 g by mouth daily.   senna 8.6 MG Tabs tablet Commonly known as: SENOKOT Take 2 tablets (17.2 mg total) by mouth at bedtime.   tadalafil 5 MG tablet Commonly known as: CIALIS Take 5 mg by mouth daily as needed for erectile dysfunction.   Tresiba FlexTouch 200 UNIT/ML FlexTouch Pen Generic drug: insulin  degludec Inject 54 Units into the skin in the morning and at bedtime.        Follow-up Information     Jannetta Donnice Lenis, MD Follow up in 1 week(s).   Specialty: Family Medicine Contact information: [address] [place] [address] [phone]         Janit Thresa HERO, DPM Follow up in 1 week(s).   Specialty: Podiatry Contact information: [address] [place] [address] [phone]                Discharge Exam: Robert Brennan   08/15/24 1653  Weight: 124.7 kg    General: He is not in acute distress. HENT:     Head: Normocephalic and atraumatic.  Cardiovascular:     Rate and Rhythm: Normal rate and regular rhythm.     Heart sounds: Normal heart sounds.  Pulmonary:     Effort: Pulmonary effort is normal.     Breath sounds: Normal breath sounds.  Abdominal:      Palpations: Abdomen is soft.     Tenderness: There is no abdominal tenderness.  Musculoskeletal:     Comments: Dressing over right foot Neurological:     Mental Status: Mental status is at baseline.     Condition at discharge: stable  The results of significant diagnostics from this hospitalization (including imaging, microbiology, ancillary and laboratory) are listed below for reference.   Imaging Studies: DG Foot Complete Right Result Date: 08/17/2024 CLINICAL DATA:  Postoperative exam. EXAM: DG FOOT COMPLETE 3+V*R* COMPARISON:  Right foot radiograph dated 08/15/2024. FINDINGS: Status post amputation of the first digit. No acute fracture or dislocation. Old healed fracture of the third metatarsal. Postsurgical changes in the soft tissues of the great toe. IMPRESSION: Status post amputation of the first digit. Electronically Signed   By: Vanetta Chou M.D.   On: 08/17/2024 12:01   MR FOOT RIGHT W WO CONTRAST Result Date:  08/16/2024 EXAM: MRI of the right Foot with and without contrast. 08/15/2024 08:45:00 PM TECHNIQUE: Multiplanar multisequence MRI of the right foot was performed with and without the administration of 10 mL gadobutrol  (GADAVIST ) 1 MMOL/ML injection. COMPARISON: None available. CLINICAL HISTORY: Concern for osteomyelitis. FINDINGS: LISFRANC JOINT: Visualized Lisfranc ligament is intact. No significant Lisfranc interval widening or significant periligamentous edema. BONE MARROW: There is diffuse abnormality enhancement throughout the distal phalanx of the great toe with loss of normal fat signal intensity bone marrow on the T1 weighted sequences which is concerning for osteomyelitis. GREATER AND LESSER MTP JOINTS: Advanced inflammatory arthropathic changes involving the 1st MTP joint identified with signs of synovial hypertrophy, enhancement with signs of marginal erosions. Small joint effusion with synovial cyst extending from the lateral aspect of the 1st MTP joint over the  dorsum of the 1st proximal phalanx. SOFT TISSUES: Increased fluid signal intensity within the soft tissues between the first and second and second and third interspaces noted. Diffuse edema is identified extending along the dorsum and medial aspect of the forefoot. TENDONS: Visualized flexor and extensor tendons are intact without tenosynovitis. IMPRESSION: 1. Findings concerning for osteomyelitis in the distal phalanx of the great toe. 2. Advanced inflammatory arthropathy of the 1st MTP joint. 3. Diffuse soft tissue edema about the dorsum and medial aspect of the forefoot. 4. The urgent finding will be called to the ordering provider by the Professional Radiology Assistants (PRAs) and documented in the [facility] dashboard. Electronically signed by: Waddell Calk MD 08/16/2024 08:21 AM EST RP Workstation: [id number]   DG Foot Complete Right Result Date: 08/15/2024 EXAM: 3 OR MORE VIEW(S) XRAY OF THE RIGHT FOOT 08/15/2024 06:47:52 PM COMPARISON: None available. CLINICAL HISTORY: evaluate for gas tracking FINDINGS: BONES AND JOINTS: Advanced arthritic changes in the 1st MTP joint with joint space narrowing and spurring. Probable old healed 3rd metatarsal fracture. No acute fracture. No focal osseous lesion. No joint dislocation. SOFT TISSUES: The soft tissues are unremarkable. IMPRESSION: 1. No acute bony abnormality. Electronically signed by: Franky Crease MD 08/15/2024 07:01 PM EST RP Workstation: [id number]   US  Venous Img Lower Unilateral Right Result Date: 08/15/2024 CLINICAL DATA:  Swelling. EXAM: RIGHT LOWER EXTREMITY VENOUS DOPPLER ULTRASOUND TECHNIQUE: Gray-scale sonography with compression, as well as color and duplex ultrasound, were performed to evaluate the deep venous system(s) from the level of the common femoral vein through the popliteal and proximal calf veins. COMPARISON:  None Available. FINDINGS: VENOUS Normal compressibility of the common femoral, superficial femoral, and popliteal veins, as  well as the visualized calf veins. Note is made that there is limited evaluation of the calf veins. Visualized portions of profunda femoral vein and great saphenous vein unremarkable. No filling defects to suggest DVT on grayscale or color Doppler imaging. Doppler waveforms show normal direction of venous flow, normal respiratory plasticity and response to augmentation. Limited views of the contralateral common femoral vein are unremarkable. OTHER There are enlarged right inguinal lymph nodes measuring up to 1.7 cm short axis. Limitations: none IMPRESSION: 1. No evidence of deep venous thrombosis in the right lower extremity. 2. Enlarged right inguinal lymph nodes measuring up to 1.7 cm short axis. Electronically Signed   By: Greig Pique M.D.   On: 08/15/2024 18:57    Microbiology: Results for orders placed or performed during the hospital encounter of 08/15/24  Blood culture (routine x 2)     Status: None (Preliminary result)   Collection Time: 08/15/24  6:03 PM   Specimen: BLOOD  Result Value  Ref Range Status   Specimen Description BLOOD RIGHT ANTECUBITAL  Final   Special Requests   Final    BOTTLES DRAWN AEROBIC AND ANAEROBIC Blood Culture adequate volume   Culture   Final    NO GROWTH 3 DAYS Performed at [facility], [address] [place], [place] [address]    Report Status PENDING  Incomplete  Blood culture (routine x 2)     Status: None (Preliminary result)   Collection Time: 08/15/24  7:10 PM   Specimen: BLOOD  Result Value Ref Range Status   Specimen Description BLOOD BLOOD LEFT HAND  Final   Special Requests   Final    BOTTLES DRAWN AEROBIC AND ANAEROBIC Blood Culture adequate volume   Culture   Final    NO GROWTH 3 DAYS Performed at [facility], [address], [place], [place] [address]    Report Status PENDING  Incomplete  Aerobic/Anaerobic Culture w Gram Stain (surgical/deep wound)     Status: None (Preliminary result)   Collection Time: 08/17/24   8:34 AM   Specimen: PATH Digit amputation; Tissue  Result Value Ref Range Status   Specimen Description   Final    TOE Performed at [facility], [address], [place], [place] [address]    Special Requests   Final    NONE Performed at [facility], [address] [place], [place] [address]    Gram Stain   Final    NO WBC SEEN RARE GRAM POSITIVE COCCI Performed at [facility], [address] [place], [place] [address]    Culture PENDING  Incomplete   Report Status PENDING  Incomplete    Labs: CBC: Recent Labs  Lab 08/15/24 1659 08/18/24 0926  WBC 7.3 4.9  NEUTROABS 5.1 3.3  HGB 12.9* 13.1  HCT 37.9* 39.4  MCV 88.3 89.5  PLT 193 187   Basic Metabolic Panel: Recent Labs  Lab 08/15/24 1659  NA 138  K 4.0  CL 102  CO2 24  GLUCOSE 175*  BUN 15  CREATININE 1.18  CALCIUM  9.2   Liver Function Tests: Recent Labs  Lab 08/15/24 1659  AST 20  ALT 20  ALKPHOS 60  BILITOT 1.1  PROT 7.7  ALBUMIN 4.1   CBG: Recent Labs  Lab 08/17/24 1653 08/17/24 1934 08/18/24 0006 08/18/24 0411 08/18/24 0815  GLUCAP 98 164* 108* 123* 119*    Discharge time spent: greater than 30 minutes.  Signed: Aimee Somerset, MD [facility] 08/18/2024

## 2024-08-18 NOTE — TOC CM/SW Note (Signed)
 Transition of Care [facility]) - Inpatient Brief Assessment   Patient Details  Name: [name] MRN: [id number] Date of Birth: 1970-10-04  Transition of [facility]) CM/SW Contact:    Daved JONETTA Hamilton, RN Phone Number: 08/18/2024, 10:05 AM   Clinical Narrative:    Transition of Care (TOC) Screening Note   Patient Details  Name: [name] Date of Birth: 12/02/1970   Transition of Care [facility]) CM/SW Contact:    Daved JONETTA Hamilton, RN Phone Number: 08/18/2024, 10:06 AM    Transition of Care Department [facility]) has reviewed patient and no TOC needs have been identified at this time. If new patient transition needs arise, please place a TOC consult.   Transition of Care Asessment: Insurance and Status: Insurance coverage has been reviewed Patient has primary care physician: Yes   Prior level of function:: Independent Prior/Current Home Services: No current home services Social Drivers of Health Review: SDOH reviewed no interventions necessary Readmission risk has been reviewed: Yes Transition of care needs: no transition of care needs at this time

## 2024-08-20 LAB — CULTURE, BLOOD (ROUTINE X 2)
Culture: NO GROWTH
Culture: NO GROWTH
Special Requests: ADEQUATE
Special Requests: ADEQUATE

## 2024-08-22 LAB — AEROBIC/ANAEROBIC CULTURE W GRAM STAIN (SURGICAL/DEEP WOUND): Gram Stain: NONE SEEN

## 2024-08-25 LAB — SURGICAL PATHOLOGY

## 2024-08-26 ENCOUNTER — Ambulatory Visit: Payer: PRIVATE HEALTH INSURANCE

## 2024-08-26 ENCOUNTER — Encounter: Payer: Self-pay | Admitting: Podiatry

## 2024-08-26 ENCOUNTER — Ambulatory Visit: Payer: PRIVATE HEALTH INSURANCE | Admitting: Podiatry

## 2024-08-26 VITALS — Ht 71.0 in | Wt 275.0 lb

## 2024-08-26 DIAGNOSIS — S98131D Complete traumatic amputation of one right lesser toe, subsequent encounter: Secondary | ICD-10-CM | POA: Diagnosis not present

## 2024-08-26 DIAGNOSIS — M869 Osteomyelitis, unspecified: Secondary | ICD-10-CM | POA: Diagnosis not present

## 2024-08-26 MED ORDER — CLINDAMYCIN HCL 300 MG PO CAPS: 300.0000 mg | ORAL_CAPSULE | Freq: Three times a day (TID) | ORAL | 0 refills | Status: DC

## 2024-08-26 NOTE — Progress Notes (Signed)
 Chief Complaint  Patient presents with   Routine Post Op    POV #1 DOS 08/17/24 RIGHT GREAT TOE AMPUTATION, he states everything is going well, has not had any pain since procedure.    Subjective:  Patient presents today status post RT great toe amputation.  Inpatient.  DOS: 08/17/2024.  Past Medical History:  Diagnosis Date   Azoospermia    Gout    per pt stable as of 03-24-2016   Hypertension    Hypogonadism, testicular    Type 2 diabetes mellitus (HCC)    Wears glasses     Past Surgical History:  Procedure Laterality Date   AMPUTATION TOE Right 08/17/2024   Procedure: AMPUTATION, TOE;  Surgeon: Janit Thresa HERO, DPM;  Location: [facility];  Service: Orthopedics/Podiatry;  Laterality: Right;   BREAST REDUCTION SURGERY  age 60 and 56   COLONOSCOPY  2011   GRAFT APPLICATION Right 12/15/2021   Procedure: GRAFT APPLICATION;  Surgeon: Tye Millet, DO;  Location: [facility];  Service: General;  Laterality: Right;   SKIN SPLIT GRAFT Right 03/07/2022   Procedure: SKIN GRAFT SPLIT THICKNESS;  Surgeon: Elisabeth Craig RAMAN, MD;  Location: [facility];  Service: Plastics;  Laterality: Right;   TESTICLE BIOPSY N/A 04/04/2016   Procedure: BIOPSY TESTICULAR;  Surgeon: Donnice Brooks, MD;  Location: [facility];  Service: Urology;  Laterality: N/A;   WOUND DEBRIDEMENT Right 11/02/2021   Procedure: DEBRIDEMENT WOUND;  Surgeon: Tye Millet, DO;  Location: [facility];  Service: General;  Laterality: Right;    No Known Allergies  Objective/Physical Exam Neurovascular status intact.  Incision well coapted with sutures intact.  There is some slight purulent debris and material at the most lateral portion of the incision site adjacent the base of the second toe.  No malodor  Component Ref Range & Units (hover) 9 d ago  Specimen Description TOE Performed at [facility], [address] [place], [place] [address]  Special Requests NONE Performed at [facility], [address] [place], [place] [address]  Gram Stain NO WBC SEEN RARE GRAM POSITIVE COCCI  Culture FEW METHICILLIN RESISTANT STAPHYLOCOCCUS AUREUS WITHMIXED SKIN FLORA NO ANAEROBES ISOLATED Performed at [facility], [address], [place], [place] [address]  Report Status 08/22/2024 FINAL  Organism ID, Bacteria METHICILLIN RESISTANT STAPHYLOCOCCUS AUREUS  Resulting Agency [facility]     Susceptibility   Methicillin resistant staphylococcus aureus    MIC    CIPROFLOXACIN <=0.5 SENSI... Sensitive    CLINDAMYCIN <=0.25 SENS... Sensitive    ERYTHROMYCIN >=8 RESISTANT Resistant    GENTAMICIN <=0.5 SENSI... Sensitive    Inducible Clindamycin NEGATIVE Sensitive    LINEZOLID 2 SENSITIVE Sensitive    OXACILLIN RESISTANT Resistant    RIFAMPIN <=0.5 SENSI... Sensitive    TETRACYCLINE <=1 SENSITIVE Sensitive    TRIMETH/SULFA <=10 SENSIT... Sensitive    VANCOMYCIN  1 SENSITIVE Sensitive           Susceptibility Comments  Methicillin resistant staphylococcus aureus  FEW METHICILLIN RESISTANT STAPHYLOCOCCUS AUREUS      Radiographic Exam RT foot 08/26/2024:  Interval amputation of the right great toe at the level of the MTP  Assessment: 1. s/p right great toe amputation.  Inpatient. DOS: 08/17/2024   Plan of Care:  -Patient was evaluated. X-rays reviewed - Prescription for clindamycin 300 mg TID x 14 days based on Culture + MRSA -Betadine wet-to-dry dressing applied.  Leave clean dry and intact x 1 week -WBAT surgical  shoe -Return to clinic 1 week   Thresa EMERSON Sar, DPM [facility]  Dr. Thresa EMERSON Sar, DPM    [address] [place], [place] [address]                Office [phone]  Fax [phone]

## 2024-09-05 ENCOUNTER — Ambulatory Visit: Payer: PRIVATE HEALTH INSURANCE | Admitting: Podiatry

## 2024-09-05 DIAGNOSIS — M869 Osteomyelitis, unspecified: Secondary | ICD-10-CM

## 2024-09-05 DIAGNOSIS — M86171 Other acute osteomyelitis, right ankle and foot: Secondary | ICD-10-CM | POA: Diagnosis not present

## 2024-09-05 NOTE — Progress Notes (Signed)
 Chief Complaint  Patient presents with   Routine Post Op    POV #2 DOS 08/17/24 RIGHT GREAT TOE AMPUTATION, pt is here to f/u of right foot after surgery he states everything is going well, no pain, ready to get back into a normal shoe.    Subjective:  Patient presents today status post RT great toe amputation.  Inpatient.  DOS: 08/17/2024.  Past Medical History:  Diagnosis Date   Azoospermia    Gout    per pt stable as of 03-24-2016   Hypertension    Hypogonadism, testicular    Type 2 diabetes mellitus (HCC)    Wears glasses     Past Surgical History:  Procedure Laterality Date   AMPUTATION TOE Right 08/17/2024   Procedure: AMPUTATION, TOE;  Surgeon: Janit Thresa HERO, DPM;  Location: [facility];  Service: Orthopedics/Podiatry;  Laterality: Right;   BREAST REDUCTION SURGERY  age 7 and 45   COLONOSCOPY  2011   GRAFT APPLICATION Right 12/15/2021   Procedure: GRAFT APPLICATION;  Surgeon: Tye Millet, DO;  Location: [facility];  Service: General;  Laterality: Right;   SKIN SPLIT GRAFT Right 03/07/2022   Procedure: SKIN GRAFT SPLIT THICKNESS;  Surgeon: Elisabeth Craig RAMAN, MD;  Location: [facility];  Service: Plastics;  Laterality: Right;   TESTICLE BIOPSY N/A 04/04/2016   Procedure: BIOPSY TESTICULAR;  Surgeon: Donnice Brooks, MD;  Location: [facility];  Service: Urology;  Laterality: N/A;   WOUND DEBRIDEMENT Right 11/02/2021   Procedure: DEBRIDEMENT WOUND;  Surgeon: Tye Millet, DO;  Location: [facility];  Service: General;  Laterality: Right;    No Known Allergies  Objective/Physical Exam Neurovascular status intact.  Incision well coapted with sutures intact.  No purulence noted today.  Significant amount of callus tissue around the amputation site.  No drainage or surrounding erythema noted  Component Ref Range & Units (hover) 9 d ago  Specimen Description TOE Performed at [facility], [address] [place], [place] [address]  Special  Requests NONE Performed at [facility], [address] [place], [place] [address]  Gram Stain NO WBC SEEN RARE GRAM POSITIVE COCCI  Culture FEW METHICILLIN RESISTANT STAPHYLOCOCCUS AUREUS WITHMIXED SKIN FLORA NO ANAEROBES ISOLATED Performed at [facility], [address] [place], [place] [address]  Report Status 08/22/2024 FINAL  Organism ID, Bacteria METHICILLIN RESISTANT STAPHYLOCOCCUS AUREUS  Resulting Agency [facility]     Susceptibility   Methicillin resistant staphylococcus aureus    MIC    CIPROFLOXACIN <=0.5 SENSI... Sensitive    CLINDAMYCIN  <=0.25 SENS... Sensitive    ERYTHROMYCIN >=8 RESISTANT Resistant    GENTAMICIN <=0.5 SENSI... Sensitive    Inducible Clindamycin  NEGATIVE Sensitive    LINEZOLID 2 SENSITIVE Sensitive    OXACILLIN RESISTANT Resistant    RIFAMPIN <=0.5 SENSI... Sensitive    TETRACYCLINE <=1 SENSITIVE Sensitive    TRIMETH/SULFA <=10 SENSIT... Sensitive    VANCOMYCIN  1 SENSITIVE Sensitive           Susceptibility Comments  Methicillin resistant staphylococcus aureus  FEW METHICILLIN RESISTANT STAPHYLOCOCCUS AUREUS      Radiographic Exam RT foot 08/26/2024:  Interval amputation of the right great toe at the level of the MTP  Assessment: 1. s/p right great toe amputation.  Inpatient. DOS: 08/17/2024 2. MRSA+ cultures 08/17/2024  Plan of Care:  -Patient was evaluated.  -Prescription for clindamycin  300 mg TID x 14 days based on Culture + MRSA.  Prescribed on 08/26/2024 -Okay to wash  and shower and get the foot wet.  Recommend Silvadene cream with a light gauze dressing.  Supplies provided -Okay to slowly transition out of the postop shoe into good supportive tennis shoes that do not irritate or constrict the amputation site as well as the second toe -Return to clinic 2 weeks  [facility]  Thresa EMERSON Sar, DPM [facility]  Dr. Thresa EMERSON Sar, DPM    [address] [place], [place] [address]                Office [phone]  Fax [phone]

## 2024-09-26 ENCOUNTER — Encounter: Payer: Self-pay | Admitting: Podiatry

## 2024-09-26 ENCOUNTER — Ambulatory Visit (INDEPENDENT_AMBULATORY_CARE_PROVIDER_SITE_OTHER): Payer: PRIVATE HEALTH INSURANCE | Admitting: Podiatry

## 2024-09-26 DIAGNOSIS — M869 Osteomyelitis, unspecified: Secondary | ICD-10-CM

## 2024-09-26 NOTE — Progress Notes (Signed)
 "  Chief Complaint  Patient presents with   Routine Post Op     DOS 08/17/24 RIGHT GREAT TOE AMPUTATION, Follow up amputation. He feels great. No pain, discharge or change in color    Subjective:  Patient presents today status post RT great toe amputation.  Inpatient.  DOS: 08/17/2024.  Patient doing very well.  No new complaints  Past Medical History:  Diagnosis Date   Azoospermia    Gout    per pt stable as of 03-24-2016   Hypertension    Hypogonadism, testicular    Type 2 diabetes mellitus (HCC)    Wears glasses     Past Surgical History:  Procedure Laterality Date   AMPUTATION TOE Right 08/17/2024   Procedure: AMPUTATION, TOE;  Surgeon: Janit Thresa HERO, DPM;  Location: [facility];  Service: Orthopedics/Podiatry;  Laterality: Right;   BREAST REDUCTION SURGERY  age 54 and 1   COLONOSCOPY  2011   GRAFT APPLICATION Right 12/15/2021   Procedure: GRAFT APPLICATION;  Surgeon: Tye Millet, DO;  Location: [facility];  Service: General;  Laterality: Right;   SKIN SPLIT GRAFT Right 03/07/2022   Procedure: SKIN GRAFT SPLIT THICKNESS;  Surgeon: Elisabeth Craig RAMAN, MD;  Location: [facility];  Service: Plastics;  Laterality: Right;   TESTICLE BIOPSY N/A 04/04/2016   Procedure: BIOPSY TESTICULAR;  Surgeon: Donnice Brooks, MD;  Location: [facility];  Service: Urology;  Laterality: N/A;   WOUND DEBRIDEMENT Right 11/02/2021   Procedure: DEBRIDEMENT WOUND;  Surgeon: Tye Millet, DO;  Location: [facility];  Service: General;  Laterality: Right;    No Known Allergies  Objective/Physical Exam Neurovascular status intact.  Amputation site is healed.  There is no edema or erythema.  Preulcerative callus noted to the distal tip of the second digit of the right foot  Component Ref Range & Units (hover) 9 d ago  Specimen Description TOE Performed at [facility], [address] [place], [place] [address]  Special Requests NONE Performed at [facility], [address] [place], [place] [address]  Gram Stain NO WBC SEEN RARE GRAM POSITIVE COCCI  Culture FEW METHICILLIN RESISTANT STAPHYLOCOCCUS AUREUS WITHMIXED SKIN FLORA NO ANAEROBES ISOLATED Performed at [facility], [address] [place], [place] [address]  Report Status 08/22/2024 FINAL  Organism ID, Bacteria METHICILLIN RESISTANT STAPHYLOCOCCUS AUREUS  Resulting Agency [facility]     Susceptibility   Methicillin resistant staphylococcus aureus    MIC    CIPROFLOXACIN <=0.5 SENSI... Sensitive    CLINDAMYCIN  <=0.25 SENS... Sensitive    ERYTHROMYCIN >=8 RESISTANT Resistant    GENTAMICIN <=0.5 SENSI... Sensitive    Inducible Clindamycin  NEGATIVE Sensitive    LINEZOLID 2 SENSITIVE Sensitive    OXACILLIN RESISTANT Resistant    RIFAMPIN <=0.5 SENSI... Sensitive    TETRACYCLINE <=1 SENSITIVE Sensitive    TRIMETH/SULFA <=10 SENSIT... Sensitive    VANCOMYCIN  1 SENSITIVE Sensitive           Susceptibility Comments  Methicillin resistant staphylococcus aureus  FEW METHICILLIN RESISTANT STAPHYLOCOCCUS AUREUS      Radiographic Exam RT foot 08/26/2024:  Interval amputation of the right great toe at the level of the MTP  Assessment: 1. s/p right great toe amputation.  Inpatient. DOS: 08/17/2024 2. MRSA+ cultures 08/17/2024  Plan of Care:  -Patient was evaluated.  - Positive MRSA.  Completed clindamycin  300 mg TID x 14 days.  Prescribed on 08/26/2024 - Okay to resume full activity no restrictions.  Recommend  good supportive tennis shoes and sneakers that do not irritate or constrict the toebox area -Note for work provided.  Okay to return to work full activity no restrictions -Return to clinic PRN  *[employer] [facility] [employer]  Thresa EMERSON Sar, DPM [facility]  Dr. Thresa EMERSON Sar, DPM    [address] [place], [place] [address]                Office [phone]  Fax [phone]      "

## 2024-10-07 NOTE — Progress Notes (Signed)
 " Subjective: -Robert Brennan is a 54 y.o. male who was seen today for a f/u from his last visit with me in 03/2024. He was first evaluated by me in 11/2018 in consultation from Leita Agee, NP for hypertension. No f/u from 11/2018 to 02/2022 when he reestablished. Records in EMR reviewed since last visit. Please see EMR for actual lab values.   1 Hypertension, resistant   Diagnosed with hypertension in 2002.   2011 CT scan normal adrenals His HTN required escalation of medications/regimen to control. By 2014 he was on 4 medications amlodipine  10 mg, benazepril 40 mg, atenolol 100 mg, and chlorthalidone 25 mg daily. Had hypokalemia so he was started on kcl supplements and dose had to be increased because of continued hypokalemia.  10/2017 Reduced atenolol-chlorthalidone into half with same dose of kcl supplements because of hypokalemia 04/2018 K 3.3 on Chlorthalidone 12.5 mg and 3 daily of 20 meq kcl so switched Chlorthalidone to spironolactone and stopped kcl supplements. 07/2018 K 3.0, so restarted Kcl supplements and increase dose to 40 meq in 10/2018. 10/2018 K 2.9, aldo 5.7, PRA 5.5, plasma meta normal on metoprolol  100 mg daily, Aldactone 25 mg, amlodipine  10 mg, benazepril 40 mg and kcl 20 meq bid. 10/2018 1 mg DST cortisol 0.6, ACTH 3 low, Dexa 231, K 3.9 11/2018 First visit with me which was telehealth because of COVID. He says his BP is under good control except when he gets under stress or if he is taking decongestant for allergies. He monitors BP at home, usually runs around 120 -130 systolic. He is taking metoprolol  100 mg daily, Aldactone 25 mg, amlodipine  10 mg, benazepril 40 mg and kcl 20 meq bid. Uses a pill box. No h/o nausea, vomiting, diarrhea. No palpitations, sweating, headaches.  He takes all of his BP meds in morning.  Family h/o HTN in mom, diagnosed at age 70-34.  He did not f/u with me after that  02/2019 PCP increased spironolactone to 25 mg twice a day because of hypokalemia   11/2019 K 3.4 low 01/2020 3.0 01/2020 Stopped KCL and Aldactone due to unknown reason. Stressful time, no follow up with PCP or endo.  01/2021 K 3.1 01/2021 BP high 164/103. PCP noted that it appears as though he never actually started taking the spironolactone. He had switched over from atenolol chlorthalidone to just metoprolol . He did not start the potassium supplements since he had gone off of the atenolol chlorthalidone.  So basically, per PCP note his atenolol got changed to metoprolol , he had stopped chlorthalidone and kcl but never started Aldactone. But per him he was taking Aldactone in the past but then had stopped taking it. He was restarted on Aldactone 25 mg twice a day and continued on benazepril 40 mg, amlodipine  10 mg and metoprolol  XL 100 mg  07/2021 K 5.1 02/2022 he is seeing me today after more than 3 years. He was working and it was difficult to get off. Therefore, missed appointments and was not able to follow up. BP at home around 120 -140 systolic. Taking metoprolol  100 mg daily, Aldactone 25 mg, amlodipine  10 mg, benazepril 40 mg and kcl 20 meq bid. Uses a pill box. No h/o nausea, vomiting, diarrhea. No licorice. No palpitations, sweating, headaches. He takes all of his BP meds in morning.  He would like surgical cure. I ordered blood work, but he has not been able to get them drawn.  03/2022 he used to take kcl in the past but has  not taken kcl since 11/2021. He says it was expired. So currently taking Metoprolol  XL 100 mg daily, Aldactone 25 mg, amlodipine  10 mg, benazepril 40 mg. He has not noticed any significant improvement with Aldactone in BP. He was diagnosed with hypogonadism, had IVF.  Has ED on Aldactone. Did not have ED prior to starting Aldactone. Weight is stable. Min snoring when tired  03/2022 Aldo 16, PRA 4.6, K 4.1 06/2022 Reduced Aldactone to 25 mg once a day  07/2022 currently prescribed Aldactone 25 mg daily, amlodipine  10 mg daily, benazepril 40 mg daily, metoprolol   100 mg daily - has been out of spironolactone, benazepril and metoprolol  since last week so only taking amlodipine  the past week and has not yet taken it today. Checks BP at home sparingly, last a few weeks ago, does not think his cuff fits, thinks it too small. For unclear reasons, he did not reduce the dose. Discussed importance of not running out of medications. Restarted 07/2022 aldosterone 20, K 3.9, PRA 2.2. Aldosterone reduced to 25 mg daily  10/2022 Aldosterone 19, K 3.8, PRA 1.1  10/2022 Aldactone 25 mg daily, amlodipine  10 mg daily, benazepril 40 mg daily, metoprolol  100 mg daily. He has not taken any BP meds last week. Taking over last 11 days. Stress full work. Does not check BP at home, cuff is small. Advised to buy one. Advised to stop Aldactone.  Started him on doxazosin  2 mg daily to replace Aldactone. He is not reading messages in my chart. Has not been able to check BP. Advised to  01/2023 PRA 0.6, aldosterone 11, K4.0. Says his first cousin had adrenal problem and had surgery about 3 weeks ago he says for hyperaldosteronism 03/2023 PRA 1.0, K3.6, aldosterone 9.7. Meta Mean NP referred to IV salt loading but he never responded  05/2023 Doxazosin  2 mg nightly, Amlodipine  10 mg daily, Benazepril 40 mg nightly, Metoprolol  100 mg bedtime. Still not checking BP at home. He prefers IV 05/2023 PRA 8.1, Cr 1.4 high, K 3.7, surprisingly high PRA. I asked for meds. No answer. Could not proceed with saline suppression because PRA was too high Advised to reduce lisinopril to 20 mg and increase doxazosin  to 4 mg.  He was working so he did not read message. Detailed discussion about compliance and communication. But he promises to read his messages moving forward 01/2024 We have tried on multiple occasions to communicate with him but had been unable to. He is not picking up phone because he thinks they are scams. Not reading his messages because he is busy. Not checking his BP. We had a detailed discussion  regarding communication and compliance. I again advised him to start treatment empirically so at least his BP would be under better control.  But he would like to do full evaluation. In that case I advised him to stop lisinopril, restart metoprolol  which I prescribed, continue amlodipine  and doxazosin . Ordered blood work in 6 weeks.  01/2024 Aldosterone 23 high, PRA 6.9, renal panel normal.   03/2024 Not taking lisinopril or doxazosin . He thought I had stopped doxazosin , but I had not. Taking metoprolol , amlodipine . Checks BP at home, occasionally, they run 130-150 systolic. He did blood work on the same day he saw me but did not do blood work in 6 weeks I had ordered.  He says he forgot about it. This appt today was to follow up on that. I advised him to restart taking doxazosin  and get repeat labs.  03/2024 Aldosterone 16, PRA <  0.6, renal panel normal  2 Diabetes T2 07/2022 Takes Ozempic 1 mg weekly, Metformin  1000 BID, Tresiba  52 BID. He had severe vomiting at the time of appt. He noted that about 2 weeks ago he felt similar in the middle of the night, but it passed without vomiting. 08/2022 Started Jardiance  because of proteinuria.  10/2022 Takes Ozempic 1 mg weekly, Tresiba , Jardiance . BG 70-80's. Lost 3 lbs. He has been on Ozempic for 2 years and tolerating that and then they added Jardiance  and that he has nausea and vomiting. He got sick again last week. He notices he gets only before medical appt. He did not have any nausea since last appt up until last week. No more vomiting so he resumed Ozempic. Advised to reduce Ozempic to 0.5 mcg weekly. 01/2023 Reduced Tresiba  to 60 units at bedtime only as FBG lowish  03/2023 FBG in 60's. Reduced Tresiba  to 50 units at bedtime, 05/2023 We are officially managing diabetes. He has not been able to get Ozempic for past 2 months from insurance.  01/2024 He has increased tresiba  to 64 units on his own. Lost his meter so not checking BG. This morning BG was 86. We  have no other BG readings. He denies feeling hypoglycemia. I reduced Tresiba  to 50 units.  03/2024 He has not picked up new meter so has not been checking BG often. We have only 2 BG readings with old meter. They were fasting. No s/s of low BG. I have cautioned against hypoglycemia. I advised him to check BG at different times including postprandial and fasting state because I suspect that he may be having hypoglycemia in fasting state. Declined sensor though offered. I have prescribed glucometer. Diabetes Regimen: - Modifying factors: - Tresiba  50 units at bedtime, Ozempic 0.5 mg weekly, Jardiance  25 mg and metformin  1000 mg BID Diet: eats 3 meals and 2 snacks  Exercise: walking 3 miles a day  Blood sugars checks: no meter. Does not want sensor though offered  Blood sugars pattern:  Hypoglycemia: none   3 Hyperlipidemia  01/2024 Ran out of statins so not taking. Not sure when he ran out, but he thinks it could be for more than a year. His lipids were good in 01/2024 without statins however because of family history of CAD, we decided to restart statins   Interim history since last Endocrine appointment: -  He taking doxazosin , metoprolol , and amlodipine . These are the only three BP meds he is taking. He is not taking Kcl Checks BP at home, occasionally, they run ~ 130 systolic  He had issues with audio today, so MA had to spend extra time with him to connect. 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 So we had brief discussion, but decisive. He said he is on the same regimen for diabetes and his BG are fine.   Interim history since last Endocrine appointment:-  He taking doxazosin , metoprolol , and amlodipine . 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 Normal seminal volume and pH. He had normal chromosomal analysis  and no micro-deletions. S/p testicular biopsy in 2017 TFTs normal.   PCP Jannetta Donnice Lenis, MD PHONE: [phone] FAX: [phone]     Medical history: Past Medical History:  Diagnosis Date   Diabetes mellitus type 2, uncomplicated (CMS/HHS-HCC)  Gout    Hypertension    Hypokalemia 12/08/2012   Morbid obesity with BMI of 40.0-44.9, adult (CMS-HCC)    Skin graft hematoma 03/07/2022   right leg    Past surgical history: Past Surgical History:  Procedure Laterality Date   COLONOSCOPY W/REMOVAL LESIONS BY SNARE N/A 07/15/2019   Procedure: COLORECTAL CANCER SCREENING;  Surgeon: Gaile Claretta Denmark, MD;  Location: [facility];  Service: Gastroenterology;  Laterality: N/A;   wound debridement Right 11/02/2021   Dr Henriette Pierre   COLONOSCOPY     sperm retrieval      Family History: Family History  Problem Relation Name Age of Onset   High blood pressure (Hypertension) Mother     Prostate cancer Father     Lymphoma Father     Lung cancer Father     Colon cancer Paternal Grandmother     Colon polyps Neg Hx     Anesthesia problems Neg Hx      Medications:  Current Outpatient Medications:    allopurinoL  (ZYLOPRIM ) 100 MG tablet, Take 2 tablets by mouth once daily, Disp: 60 tablet, Rfl: 2   amLODIPine  (NORVASC ) 10 MG tablet, Take 1 tablet (10 mg total) by mouth once daily for 180 days, Disp: 90 tablet, Rfl: 1   atorvastatin  (LIPITOR) 10 MG tablet, Take 1 tablet (10 mg total) by mouth at bedtime, Disp: 90 tablet, Rfl: 3   blood-glucose meter (CONTOUR NEXT EZ METER) Misc, 1 each as directed . Contour next, Disp: 1 each, Rfl: 1   empagliflozin  (JARDIANCE ) 25 mg tablet, Take 1 tablet (25 mg total) by mouth once daily, Disp: 90 tablet, Rfl: 3   metFORMIN  (GLUCOPHAGE ) 1000 MG tablet, Take 1 tablet (1,000 mg total) by mouth 2 (two) times daily with meals for 360 days, Disp: 180 tablet, Rfl: 3   metoprolol  SUCCinate (TOPROL -XL) 100 MG XL tablet,  TAKE 1 TABLET BY MOUTH AT BEDTIME, Disp: 90 tablet, Rfl: 0   TRESIBA  FLEXTOUCH U-200 pen injector (concentration 200 units/mL), Inject 50 Units subcutaneously at bedtime, Disp: 45 mL, Rfl: 0   blood glucose diagnostic (CONTOUR TEST STRIPS) test strip, 3 (three) times daily Use as instructed., Disp: 100 each, Rfl: 11   blood glucose diagnostic (ONETOUCH VERIO TEST STRIPS) test strip, 1 each (1 strip total) 3 (three) times daily, Disp: 100 each, Rfl: 11   blood-gluc,BP meter,adult cuff Devi, Use 1 kit once daily (Patient not taking: Reported on 10/07/2024), Disp: 1 each, Rfl: 1   CIALIS 5 mg tablet, Take 1 tablet by mouth once daily (Patient not taking: Reported on 08/15/2024), Disp: 10 tablet, Rfl: 7   CLOMID 50 mg tablet, Take 25 mg by mouth once daily (Patient not taking: Reported on 08/15/2024), Disp: , Rfl:    doxazosin  (CARDURA ) 4 MG tablet, Take 1 tablet (4 mg total) by mouth at bedtime (Patient not taking: Reported on 10/07/2024), Disp: 30 tablet, Rfl: 2   fluticasone propionate (FLONASE) 50 mcg/actuation nasal spray, Place 1 spray into both nostrils 2 (two) times daily (Patient not taking: Reported on 09/08/2024), Disp: 16 g, Rfl: 0   lancets (ONETOUCH DELICA PLUS LANCET), Use 1 each 3 (three) times daily, Disp: 100 each, Rfl: 11   lancets, Use 1 each 3 (three) times daily Use as instructed., Disp: 100 each, Rfl: 12   meloxicam  (MOBIC ) 7.5 MG tablet, Take by mouth (Patient not taking: Reported on 08/15/2024), Disp: , Rfl:    ondansetron  (ZOFRAN ) 4 MG tablet, Take 4 mg by mouth every 8 (  eight) hours as needed (Patient not taking: Reported on 09/08/2024), Disp: , Rfl:    oxyCODONE  (ROXICODONE ) 5 MG immediate release tablet, TAKE 1 TABLET BY MOUTH EVERY 6 HOURS AS NEEDED FOR SEVERE PAIN FOR UP TO FIVE DAYS (Patient not taking: Reported on 08/15/2024), Disp: , Rfl:    promethazine -dextromethorphan (PROMETHAZINE -DM) 6.25-15 mg/5 mL syrup, Take 5 mLs by mouth every 6 (six) hours as needed  (Patient not taking: Reported on 08/15/2024), Disp: 120 mL, Rfl: 0   promethazine -dextromethorphan (PROMETHAZINE -DM) 6.25-15 mg/5 mL syrup, Take by mouth (Patient not taking: Reported on 08/15/2024), Disp: , Rfl:    semaglutide (OZEMPIC) 0.25 mg or 0.5 mg (2 mg/3 mL) pen injector, Inject 0.75 mLs (0.5 mg total) subcutaneously once a week for 90 days, Disp: 9 mL, Rfl: 0   tadalafiL (CIALIS) 20 MG tablet, Take 20 mg by mouth once daily as needed (Patient not taking: Reported on 08/15/2024), Disp: , Rfl:    traMADoL  (ULTRAM ) 50 mg tablet, Take 50 mg by mouth every 8 (eight) hours as needed (Patient not taking: Reported on 08/15/2024), Disp: , Rfl:   Allergies: Allergies  Allergen Reactions   Thiazides Other (See Comments)    Low K   Pertinent Social:   ROS: negative except as outlined above. I either addressed the positive symptoms myself or advised the patient to follow up with PCP for all non-endocrine related symptoms and concerns.    Physical Exam:   Vitals:   10/07/24 1031  Pulse: 76  Weight: (!) 126.1 kg (278 lb)  Height: 180.3 cm (5' 11)    BP Readings from Last 3 Encounters:  08/25/24 139/86  08/15/24 (!) 140/85  01/29/24 (!) 150/84   Wt Readings from Last 3 Encounters:  10/07/24 (!) 126.1 kg (278 lb)  08/25/24 (!) 124.6 kg (274 lb 11.1 oz)  08/15/24 (!) 125.6 kg (277 lb)   Body mass index is 38.77 kg/m.  General appearance: Alert and oriented x 3, no acute distress Vitals reviewed and noted in [place] care  Respiratory: Normal respiratory effort Mental appropriate mood. Memory intact.   Assessment and plan:   1 Hypertension, resistant   - Long standing h/o HTN. He says his BP was under good control, even before he was started on Aldactone. But chart review showed high BP in 01/2021 when he was off Aldactone. Nevertheless, he is requiring 4 antihypertensives to control his BP, so it qualifies for resistant hypertension.  - He has h/o significant hypokalemia  although on K losing diuretic. No h/o nausea, vomiting, diarrhea to explain hypokalemia. In 07/2018 he could have had spontaneous hypokalemia but there is no clarity about what he was taking at that time.  - 10/2018 K 2.9, Aldo 5.7, PRA 5.5. His PRA is not low, but this was in the setting of hypokalemia. - 2011 CT scan normal adrenals. Repeat in 11/2022 again with normal appearing adrenal glands.  - Plasma metanephrines normal in 2020 so pheochromocytoma ruled out  - 10/2018 1 mg DST normal so [place] ruled out  - Says his first cousin had adrenal problem and had surgery for hyperaldosteronism - I have had detailed discussion about essential hypertension, secondary hypertension, primary hyperaldosteronism, steps involved in diagnostic evaluation, and treatment options at his previous appointments. I have explained that the surgical cure for primary hyperaldosteronism is only possible if we confirm the diagnosis, do adrenal vein sampling and if it reveals unilateral source of aldosterone overproduction. He had not noticed a big improvement with Aldactone in BP which  is surprising if he has PA. He also does not want to take that medication again because it caused him to have gynecomastia per him. I have also explained that if he does qualify for surgical treatment, we expect complete resolution of hypokalemia and improvement in blood pressure. We do not expect complete resolution of hypertension because he probably also has underlying essential hypertension and some component from obesity. He would like to proceed with evaluation for primary hyperaldosteronism and if possible, surgical cure. If he does not qualify for surgical treatment, will try eplerenone.  - 03/2022 Aldo 16, PRA 4.6, K 4.1 while taking spironolactone 25 mg BID. Aldosterone was reduced to 25 mg daily, but PRA was not suppressed in 10/2022, so Aldactone was stopped, and he was started on doxazosin  2 mg nightly. 01/2023 aldosterone 11 and PRA 0.6 so  his PRA was suppressed after stopping Aldactone, but again PRA was not suppressed in 03/2023, and aldosterone was also <10. PRA in 05/2023 was quite high and surprisingly rose from 1 to 8.4.  He denied taking anything different to explain the results. He wants full evaluation rather than empiric treatment. So lisinopril was reduced to 20 mg and doxazosin  increase to 4 mg. In 01/2024 again he had high aldosterone but with high PRA. In 01/2024, I stopped lisinopril and restarted metoprolol , continued amlodipine  and doxazosin , but he stopped doxazosin . In 03/2024 I advised him to restart taking doxazosin  - Finally in 03/2024 he has undetectable PRA, and aldosterone 16 with normokalaemia.  - Check PRA and aldosterone again tomorrow.  If consistent with primary hyperaldosteronism, will refer for AVS. -After taking him off all the differing medicine, his PRA is suppressed but his aldosterone is only 11 which according to the new guidelines is in the intermediate range to have a unilateral aldosterone source.  I discussed and explained this to him.  He would still like to try to see if he would be a candidate for surgery.  Then I we will have to change metoprolol  to hydralazine if that does not bring his aldosterone up then I will do confirmatory test.  For now I advised him to start taking doxazosin  to control his blood pressure and reach out to me when he is ready to make changes in the medicine - Advised to check blood pressure at home and let me know if high.  I have prescribed blood pressure meter -Will check as needed aldosterone and renal panel before next visit - I have discussed lack of communication and lack of adherence with treatment plan. He is often not following recommendations in terms of taking medications as advised and doing lab work on time. I have explained that his lack of communication and adherence is delaying care, and he may already be developing renal failure. I advised him to involve his  wife  2 T2 Diabetes - Controlled based on last HbA1c 5.4 % in 01/2024. Check HbA1c in a few months  - I have discussed complications of uncontrolled diabetes and importance of optimizing BG control, healthy lifestyle including diet and exercise as tolerated, different class of medications along with their R/B/SE/AE, BG monitoring, management of hyper or hypoglycemia, communication if BG are high or low. - Continue the same dose of Ozempic as he previously had nausea and vomiting on 1 mg weekly dose, which improved with dose reduction. - Continue Tresiba , Jardiance  25 mg and metformin  1000 mg BID -I have explained that Tresiba  Jardiance  and metformin  are best for overall blood sugar control  but may not be enough to control postprandial glycemic excursions that Ozempic was.  If he is not tolerating Ozempic, I will switch him to Mounjaro at the lowest dose.  I will go up on the dose only if he would like me to -I also prescribed CGMS because he is prone to both hypo and hyperglycemia.  Up until then advised him to check blood sugar 4 times a day.  I referred him to diabetes educator to learn Dexcom -I was notified that he needs fast forms to fill out, but he did not bring any.  I advised him to send a form for me to fill out.  I will also notify [facility] well who had sent me notice -If he starts noticing hypoglycemia after adding Mounjaro, I advised him to start reducing dose of Tresiba , from 50-45 and then 40 units as needed - He has increased basal insulin  dose a lot.  - I have cautioned against hypoglycemia.  - I have advised him to check blood sugars at different times including postprandially and fasting state because I suspect that he may be having hypoglycemia in fasting state. He denies having hypoglycemia today  - Does not want sensor though offered. I have prescribed glucometer. - Last diabetes education/nutritionist?  - Check Vitamin B12 level while taking metformin . - Assessment of end-organ  diabetes-related complications: - (+) Known h/o diabetic retinopathy: Last dilated eye exam in 04/2024 showed mild nonproliferative retinopathy.  Recommend yearly eye exam.  (+) Known h/o diabetic nephropathy or proteinuria: Urine microalbumin with significant improvement in 10/2022 since addition of Jardiance . Last continues to be normal in 01/2024. Check in 1 year (-) Known h/o neuropathy: Last foot exam by podiatry in 02/2024 (-) Known h/o CAD/PAD/Stroke:  3 Hyperlipidemia - Last FLP in 01/2024 reasonably well controlled without taking statins as he had run out.  - However because of family history of CAD, we decided to restart statins. - LFT normal  - Check FLP and LFT in few months.  - TFTs normal in 01/2023. Check before next visit   Order labs to be done before next visit  I spent a total of 55 minutes in both face-to-face and non-face-to-face activities for this visit on the date of this encounter.  "

## 2024-10-19 ENCOUNTER — Encounter: Payer: Self-pay | Admitting: Emergency Medicine

## 2024-10-19 ENCOUNTER — Other Ambulatory Visit: Payer: Self-pay

## 2024-10-19 ENCOUNTER — Inpatient Hospital Stay: Admission: EM | Admit: 2024-10-19 | Discharge: 2024-10-21 | DRG: 617 | Disposition: A | Payer: PRIVATE HEALTH INSURANCE

## 2024-10-19 ENCOUNTER — Emergency Department: Payer: PRIVATE HEALTH INSURANCE

## 2024-10-19 DIAGNOSIS — I10 Essential (primary) hypertension: Secondary | ICD-10-CM | POA: Diagnosis present

## 2024-10-19 DIAGNOSIS — L97516 Non-pressure chronic ulcer of other part of right foot with bone involvement without evidence of necrosis: Secondary | ICD-10-CM | POA: Diagnosis present

## 2024-10-19 DIAGNOSIS — E88819 Insulin resistance, unspecified: Secondary | ICD-10-CM | POA: Diagnosis present

## 2024-10-19 DIAGNOSIS — E11621 Type 2 diabetes mellitus with foot ulcer: Principal | ICD-10-CM | POA: Diagnosis present

## 2024-10-19 DIAGNOSIS — Z794 Long term (current) use of insulin: Secondary | ICD-10-CM | POA: Diagnosis not present

## 2024-10-19 DIAGNOSIS — M109 Gout, unspecified: Secondary | ICD-10-CM | POA: Diagnosis present

## 2024-10-19 DIAGNOSIS — E119 Type 2 diabetes mellitus without complications: Secondary | ICD-10-CM

## 2024-10-19 DIAGNOSIS — E1169 Type 2 diabetes mellitus with other specified complication: Secondary | ICD-10-CM | POA: Diagnosis present

## 2024-10-19 DIAGNOSIS — L03031 Cellulitis of right toe: Secondary | ICD-10-CM | POA: Diagnosis present

## 2024-10-19 DIAGNOSIS — E66812 Obesity, class 2: Secondary | ICD-10-CM | POA: Diagnosis present

## 2024-10-19 DIAGNOSIS — Z8249 Family history of ischemic heart disease and other diseases of the circulatory system: Secondary | ICD-10-CM | POA: Diagnosis not present

## 2024-10-19 DIAGNOSIS — Z86718 Personal history of other venous thrombosis and embolism: Secondary | ICD-10-CM | POA: Diagnosis not present

## 2024-10-19 DIAGNOSIS — Z6837 Body mass index (BMI) 37.0-37.9, adult: Secondary | ICD-10-CM

## 2024-10-19 DIAGNOSIS — Z8261 Family history of arthritis: Secondary | ICD-10-CM | POA: Diagnosis not present

## 2024-10-19 DIAGNOSIS — Z79899 Other long term (current) drug therapy: Secondary | ICD-10-CM | POA: Diagnosis not present

## 2024-10-19 DIAGNOSIS — M86171 Other acute osteomyelitis, right ankle and foot: Secondary | ICD-10-CM | POA: Diagnosis present

## 2024-10-19 DIAGNOSIS — L089 Local infection of the skin and subcutaneous tissue, unspecified: Secondary | ICD-10-CM | POA: Diagnosis present

## 2024-10-19 DIAGNOSIS — Z89411 Acquired absence of right great toe: Secondary | ICD-10-CM | POA: Diagnosis not present

## 2024-10-19 DIAGNOSIS — E785 Hyperlipidemia, unspecified: Secondary | ICD-10-CM | POA: Diagnosis present

## 2024-10-19 DIAGNOSIS — Z7985 Long-term (current) use of injectable non-insulin antidiabetic drugs: Secondary | ICD-10-CM | POA: Diagnosis not present

## 2024-10-19 DIAGNOSIS — Z7984 Long term (current) use of oral hypoglycemic drugs: Secondary | ICD-10-CM | POA: Diagnosis not present

## 2024-10-19 LAB — CBC WITH DIFFERENTIAL/PLATELET
Abs Immature Granulocytes: 0 10*3/uL (ref 0.00–0.07)
Basophils Absolute: 0 10*3/uL (ref 0.0–0.1)
Basophils Relative: 0 %
Eosinophils Absolute: 0.2 10*3/uL (ref 0.0–0.5)
Eosinophils Relative: 4 %
HCT: 39.9 % (ref 39.0–52.0)
Hemoglobin: 13.9 g/dL (ref 13.0–17.0)
Immature Granulocytes: 0 %
Lymphocytes Relative: 26 %
Lymphs Abs: 1.4 10*3/uL (ref 0.7–4.0)
MCH: 29.7 pg (ref 26.0–34.0)
MCHC: 34.8 g/dL (ref 30.0–36.0)
MCV: 85.3 fL (ref 80.0–100.0)
Monocytes Absolute: 0.4 10*3/uL (ref 0.1–1.0)
Monocytes Relative: 8 %
Neutro Abs: 3.2 10*3/uL (ref 1.7–7.7)
Neutrophils Relative %: 62 %
Platelets: 189 10*3/uL (ref 150–400)
RBC: 4.68 MIL/uL (ref 4.22–5.81)
RDW: 13.1 % (ref 11.5–15.5)
WBC: 5.2 10*3/uL (ref 4.0–10.5)
nRBC: 0 % (ref 0.0–0.2)

## 2024-10-19 LAB — GLUCOSE, CAPILLARY
Glucose-Capillary: 89 mg/dL (ref 70–99)
Glucose-Capillary: 88 mg/dL (ref 70–99)

## 2024-10-19 LAB — BASIC METABOLIC PANEL WITH GFR
Anion gap: 12 (ref 5–15)
BUN: 18 mg/dL (ref 6–20)
CO2: 26 mmol/L (ref 22–32)
Calcium: 9.2 mg/dL (ref 8.9–10.3)
Chloride: 104 mmol/L (ref 98–111)
Creatinine, Ser: 0.84 mg/dL (ref 0.61–1.24)
GFR, Estimated: >60 mL/min
Glucose, Bld: 131 mg/dL — ABNORMAL HIGH (ref 70–99)
Potassium: 3.9 mmol/L (ref 3.5–5.1)
Sodium: 143 mmol/L (ref 135–145)

## 2024-10-19 LAB — HEMOGLOBIN A1C
Hgb A1c MFr Bld: 6.7 % — ABNORMAL HIGH (ref 4.8–5.6)
Mean Plasma Glucose: 145.59 mg/dL

## 2024-10-19 MED ORDER — INSULIN ASPART 100 UNIT/ML IJ SOLN: 0.0000 [IU] | Freq: Every day | INTRAMUSCULAR | Status: DC

## 2024-10-19 MED ORDER — AMLODIPINE BESYLATE 5 MG PO TABS
10.0000 mg | ORAL_TABLET | Freq: Every morning | ORAL | Status: DC
  Administered 2024-10-20 – 2024-10-21 (×2): 10 mg via ORAL
  Filled 2024-10-19: qty 1
  Filled 2024-10-19: qty 2

## 2024-10-19 MED ORDER — SENNA 8.6 MG PO TABS
2.0000 | ORAL_TABLET | Freq: Every day | ORAL | Status: DC
  Administered 2024-10-19 – 2024-10-20 (×2): 17.2 mg via ORAL
  Filled 2024-10-19 (×2): qty 2

## 2024-10-19 MED ORDER — ENOXAPARIN SODIUM 60 MG/0.6ML IJ SOSY
0.5000 mg/kg | PREFILLED_SYRINGE | INTRAMUSCULAR | Status: DC
  Administered 2024-10-20: 62.5 mg via SUBCUTANEOUS
  Filled 2024-10-19: qty 1.2

## 2024-10-19 MED ORDER — ONDANSETRON HCL 4 MG/2ML IJ SOLN: 4.0000 mg | Freq: Four times a day (QID) | INTRAMUSCULAR | Status: DC | PRN

## 2024-10-19 MED ORDER — ACETAMINOPHEN 325 MG PO TABS
650.0000 mg | ORAL_TABLET | Freq: Four times a day (QID) | ORAL | Status: DC | PRN
  Administered 2024-10-20 – 2024-10-21 (×2): 650 mg via ORAL
  Filled 2024-10-19 (×2): qty 2

## 2024-10-19 MED ORDER — INSULIN DEGLUDEC 200 UNIT/ML ~~LOC~~ SOPN: 54.0000 [IU] | PEN_INJECTOR | SUBCUTANEOUS | Status: DC

## 2024-10-19 MED ORDER — INSULIN ASPART 100 UNIT/ML IJ SOLN
0.0000 [IU] | Freq: Three times a day (TID) | INTRAMUSCULAR | Status: DC
  Administered 2024-10-21: 3 [IU] via SUBCUTANEOUS
  Filled 2024-10-19: qty 3

## 2024-10-19 MED ORDER — METFORMIN HCL 500 MG PO TABS
1000.0000 mg | ORAL_TABLET | Freq: Two times a day (BID) | ORAL | Status: DC
  Administered 2024-10-19 – 2024-10-21 (×3): 1000 mg via ORAL
  Filled 2024-10-19 (×3): qty 2

## 2024-10-19 MED ORDER — INSULIN GLARGINE 100 UNIT/ML ~~LOC~~ SOLN
54.0000 [IU] | Freq: Every day | SUBCUTANEOUS | Status: DC
  Administered 2024-10-20 – 2024-10-21 (×2): 54 [IU] via SUBCUTANEOUS
  Filled 2024-10-19 (×2): qty 0.54

## 2024-10-19 MED ORDER — METOPROLOL SUCCINATE ER 50 MG PO TB24
100.0000 mg | ORAL_TABLET | Freq: Every day | ORAL | Status: DC
  Administered 2024-10-19 – 2024-10-20 (×2): 100 mg via ORAL
  Filled 2024-10-19 (×2): qty 2

## 2024-10-19 MED ORDER — ATORVASTATIN CALCIUM 20 MG PO TABS
10.0000 mg | ORAL_TABLET | Freq: Every day | ORAL | Status: DC
  Administered 2024-10-19 – 2024-10-20 (×2): 10 mg via ORAL
  Filled 2024-10-19 (×2): qty 1

## 2024-10-19 MED ORDER — OXYCODONE HCL 5 MG PO TABS: 5.0000 mg | ORAL_TABLET | ORAL | Status: DC | PRN

## 2024-10-19 MED ORDER — ONDANSETRON HCL 4 MG PO TABS: 4.0000 mg | ORAL_TABLET | Freq: Four times a day (QID) | ORAL | Status: DC | PRN

## 2024-10-19 MED ORDER — ENOXAPARIN SODIUM 40 MG/0.4ML IJ SOSY: 40.0000 mg | PREFILLED_SYRINGE | INTRAMUSCULAR | Status: DC

## 2024-10-19 MED ORDER — POLYETHYLENE GLYCOL 3350 17 G PO PACK
17.0000 g | PACK | Freq: Every day | ORAL | Status: DC
  Filled 2024-10-19: qty 1

## 2024-10-19 MED ORDER — EMPAGLIFLOZIN 25 MG PO TABS
25.0000 mg | ORAL_TABLET | Freq: Every day | ORAL | Status: DC
  Administered 2024-10-20 – 2024-10-21 (×2): 25 mg via ORAL
  Filled 2024-10-19 (×2): qty 1

## 2024-10-19 MED ORDER — ACETAMINOPHEN 650 MG RE SUPP: 650.0000 mg | Freq: Four times a day (QID) | RECTAL | Status: DC | PRN

## 2024-10-19 MED ORDER — DOXAZOSIN MESYLATE 4 MG PO TABS
4.0000 mg | ORAL_TABLET | Freq: Every day | ORAL | Status: DC
  Administered 2024-10-19 – 2024-10-20 (×2): 4 mg via ORAL
  Filled 2024-10-19 (×2): qty 1

## 2024-10-19 MED ORDER — SENNOSIDES-DOCUSATE SODIUM 8.6-50 MG PO TABS: 1.0000 | ORAL_TABLET | Freq: Every evening | ORAL | Status: DC | PRN

## 2024-10-19 MED ORDER — ALLOPURINOL 100 MG PO TABS
200.0000 mg | ORAL_TABLET | Freq: Every morning | ORAL | Status: DC
  Administered 2024-10-20 – 2024-10-21 (×2): 200 mg via ORAL
  Filled 2024-10-19 (×2): qty 2

## 2024-10-19 MED ORDER — TRAZODONE HCL 50 MG PO TABS: 25.0000 mg | ORAL_TABLET | Freq: Every evening | ORAL | Status: DC | PRN

## 2024-10-19 NOTE — ED Provider Notes (Signed)
 "  [name] Provider Note    Event Date/Time   First MD Initiated Contact with Patient 10/19/24 1414     (approximate)   History   Foot Pain   HPI  Robert Brennan is a 54 y.o. male with diabetes with insulin  resistance, hypertension hyperlipidemia, gout, status post great toe amputations presenting with right second toe infection.  Patient reports that he has been off antibiotics for the past 2 months since his great toe was amputated.  He denies any trauma to his feet however noticed yesterday he had worsening discoloration of the foot.  Denies any new sensation changes.  Reports that his sugars have been within range.  Has no fevers chills cough or congestion     Physical Exam   Triage Vital Signs: ED Triage Vitals  Encounter Vitals Group     BP 10/19/24 1348 (!) 154/89     Girls Systolic BP Percentile --      Girls Diastolic BP Percentile --      Boys Systolic BP Percentile --      Boys Diastolic BP Percentile --      Pulse Rate 10/19/24 1347 80     Resp 10/19/24 1347 16     Temp 10/19/24 1347 97.8 F (36.6 C)     Temp Source 10/19/24 1347 Oral     SpO2 10/19/24 1347 100 %     Weight 10/19/24 1348 270 lb (122.5 kg)     Height 10/19/24 1348 5' 11 (1.803 m)     Head Circumference --      Peak Flow --      Pain Score 10/19/24 1348 0     Pain Loc --      Pain Education --      Exclude from Growth Chart --     Most recent vital signs: Vitals:   10/19/24 1655 10/19/24 1720  BP: 126/76 (!) 155/92  Pulse: (!) 59 67  Resp: 18 17  Temp: 97.6 F (36.4 C) 97.8 F (36.6 C)  SpO2: 100% 100%    Nursing Triage Note reviewed. Vital signs reviewed and patients oxygen saturation is normoxic  General: Patient is well nourished, well developed, awake and alert, resting comfortably in no acute distress Head: Normocephalic and atraumatic Eyes: Normal inspection, extraocular muscles intact, no conjunctival pallor Ear, nose, throat: Normal external  exam Neck: Normal range of motion Respiratory: Patient is in no respiratory distress, lungs CTAB Cardiovascular: Patient is not tachycardic, RRR without murmur appreciated GI: Abd SNT with no guarding or rebound  Back: Normal inspection of the back with good strength and range of motion throughout all ext Extremities: pulses intact with good cap refills, no LE pitting edema or calf tenderness  Of note patient does have 2+ DP pulse in the foot and great cap refill in his other digits  Neuro: The patient is alert and oriented to person, place, and time, appropriately conversive, with 5/5 bilat UE/LE strength, no gross motor or sensory defects noted. Coordination appears to be adequate. Psych: normal mood and affect, no SI or HI  ED Results / Procedures / Treatments   Labs (all labs ordered are listed, but only abnormal results are displayed) Labs Reviewed  BASIC METABOLIC PANEL WITH GFR - Abnormal; Notable for the following components:      Result Value   Glucose, Bld 131 (*)    All other components within normal limits  CULTURE, BLOOD (ROUTINE X 2)  CULTURE, BLOOD (ROUTINE X 2)  CBC WITH DIFFERENTIAL/PLATELET  GLUCOSE, CAPILLARY  HEMOGLOBIN A1C     EKG None  RADIOLOGY Xray right foot: Concerning for osteomyelitis of the right foot    PROCEDURES:  Critical Care performed: No  Procedures   MEDICATIONS ORDERED IN ED: Medications  allopurinol  (ZYLOPRIM ) tablet 200 mg (has no administration in time range)  amLODipine  (NORVASC ) tablet 10 mg (has no administration in time range)  atorvastatin  (LIPITOR) tablet 10 mg (has no administration in time range)  doxazosin  (CARDURA ) tablet 4 mg (has no administration in time range)  metoprolol  succinate (TOPROL -XL) 24 hr tablet 100 mg (has no administration in time range)  empagliflozin  (JARDIANCE ) tablet 25 mg (has no administration in time range)  metFORMIN  (GLUCOPHAGE ) tablet 1,000 mg (1,000 mg Oral Given 10/19/24 1800)   polyethylene glycol (MIRALAX  / GLYCOLAX ) packet 17 g (has no administration in time range)  senna (SENOKOT) tablet 17.2 mg (has no administration in time range)  acetaminophen  (TYLENOL ) tablet 650 mg (has no administration in time range)    Or  acetaminophen  (TYLENOL ) suppository 650 mg (has no administration in time range)  oxyCODONE  (Oxy IR/ROXICODONE ) immediate release tablet 5 mg (has no administration in time range)  ondansetron  (ZOFRAN ) tablet 4 mg (has no administration in time range)    Or  ondansetron  (ZOFRAN ) injection 4 mg (has no administration in time range)  traZODone  (DESYREL ) tablet 25 mg (has no administration in time range)  senna-docusate (Senokot-S) tablet 1 tablet (has no administration in time range)  insulin  aspart (novoLOG ) injection 0-20 Units ( Subcutaneous Not Given 10/19/24 1740)  insulin  aspart (novoLOG ) injection 0-5 Units (has no administration in time range)  enoxaparin  (LOVENOX ) injection 62.5 mg (has no administration in time range)  insulin  glargine (LANTUS ) injection 54 Units (has no administration in time range)     IMPRESSION / MDM / ASSESSMENT AND PLAN / ED COURSE                                Differential diagnosis includes, but is not limited to: Diabetic ulceration, osteomyelitis, uncontrolled glucose, DKA, electrolyte derangement  ED course: Patient is well-appearing however the foot does appear acutely infected.  He does not have a leukocytosis or any electrolyte derangements.  I did send a hemoglobin A1c which is pending along with blood cultures.  X-rays of foot did demonstrate possible osteomyelitis.  His case was discussed with on-call podiatrist Dr. Malvin who does recommend that the patient be made n.p.o. at midnight for likely amputation to which the patient is amenable.  His case was discussed with on-call hospitalist for admission  Clinical Course as of 10/19/24 1956  [place] Oct 19, 2024  1527 1. Focal soft tissue swelling overlying  the first metatarsophalangeal joint. 2. Status post amputation of the first digit at the level of the metatarsophalangeal joint. 3. Erosive changes in the distal tuft of the second distal phalanx, suspicious for osteomyelitis.  Will reach out to podiatry [HD]  1541 Dr. Malvin states that n.p.o. at midnight and likely operative management tomorrow [HD]  1552 Case discussed with hospitalist for admission [HD]    Clinical Course User Index [HD] Nicholaus Rolland BRAVO, MD   -- Risk: 5 This patient has a high risk of morbidity due to further diagnostic testing or treatment. Rationale: This patients evaluation and management involve a high risk of morbidity due to the potential severity of presenting symptoms, need for diagnostic testing, and/or initiation of treatment that  may require close monitoring. The differential includes conditions with potential for significant deterioration or requiring escalation of care. Treatment decisions in the ED, including medication administration, procedural interventions, or disposition planning, reflect this level of risk. COPA: 5 The patient has the following acute or chronic illness/injury that poses a possible threat to life or bodily function: [X] : The patient has a potentially serious acute condition or an acute exacerbation of a chronic illness requiring urgent evaluation and management in the Emergency Department. The clinical presentation necessitates immediate consideration of life-threatening or function-threatening diagnoses, even if they are ultimately ruled out.   FINAL CLINICAL IMPRESSION(S) / ED DIAGNOSES   Final diagnoses:  Diabetic ulcer of toe of right foot associated with type 2 diabetes mellitus, with bone involvement without evidence of necrosis (HCC)  Other acute osteomyelitis of right foot (HCC)     Rx / DC Orders   ED Discharge Orders     None        Note:  This document was prepared using Dragon voice recognition software  and may include unintentional dictation errors.   Nicholaus Rolland BRAVO, MD 10/19/24 1956  "

## 2024-10-19 NOTE — H&P (Signed)
 " History and Physical    Robert Brennan FMW:[id number] DOB: 1971/01/06 DOA: 10/19/2024  PCP: Jannetta Donnice Lenis, MD (Confirm with patient/family/NH records and if not entered, this has to be entered at [facility] point of entry) Patient coming from: Home  I have personally briefly reviewed patient's old medical records in [facility]  Chief Complaint: Right 2nd toe swollen  HPI: Robert Brennan is a 54 y.o. male with medical history significant of IDDM with insulin  resistance, HTN, HLD, gout, previous DVT, morbid obesity, diabetic foot ulcer status post recent right great toe amputation, presented with right second toe infection.  Patient is status post right big toe amputation on 08/17/2024, after procedure he completely 2 weeks of clindamycin  treatment.  He was not seen by podiatry 3 weeks ago.  He denies any new ulcer or wound on right foot however yesterday during shower he noticed significant discoloration and swelling of the right second toe.  No fever or chills. ED Course: Afebrile, nontachycardic blood pressure 130/89 which section 100% room air.  X-ray showed focal soft tissue swelling overlying the first metatarsal-phalangeal joint erosive changes distal tuft of the second distal phalanx suspicious for osteomyelitis.  Blood work showed WBC 5.2 hemoglobin 13.9 BUN 18 creatinine 0.8.  Podiatry consulted.  Review of Systems: As per HPI otherwise 14 point review of systems negative.    Past Medical History:  Diagnosis Date   Azoospermia    Gout    per pt stable as of 03-24-2016   Hypertension    Hypogonadism, testicular    Type 2 diabetes mellitus (HCC)    Wears glasses     Past Surgical History:  Procedure Laterality Date   AMPUTATION TOE Right 08/17/2024   Procedure: AMPUTATION, TOE;  Surgeon: Janit Thresa HERO, DPM;  Location: [facility];  Service: Orthopedics/Podiatry;  Laterality: Right;   BREAST REDUCTION SURGERY  age 30 and 61   COLONOSCOPY  2011   GRAFT APPLICATION Right  12/15/2021   Procedure: GRAFT APPLICATION;  Surgeon: Tye Millet, DO;  Location: [facility];  Service: General;  Laterality: Right;   SKIN SPLIT GRAFT Right 03/07/2022   Procedure: SKIN GRAFT SPLIT THICKNESS;  Surgeon: Elisabeth Craig RAMAN, MD;  Location: [facility];  Service: Plastics;  Laterality: Right;   TESTICLE BIOPSY N/A 04/04/2016   Procedure: BIOPSY TESTICULAR;  Surgeon: Donnice Brooks, MD;  Location: [facility];  Service: Urology;  Laterality: N/A;   WOUND DEBRIDEMENT Right 11/02/2021   Procedure: DEBRIDEMENT WOUND;  Surgeon: Tye Millet, DO;  Location: [facility];  Service: General;  Laterality: Right;     reports that he has never smoked. He has never used smokeless tobacco. He reports that he does not drink alcohol and does not use drugs.  Allergies[1]  Family History  Problem Relation Age of Onset   Arthritis Mother    Hypertension Mother    Cancer Father      Prior to Admission medications  Medication Sig Start Date End Date Taking? Authorizing Provider  acetaminophen  (TYLENOL ) 500 MG tablet Take 500-1,000 mg by mouth every 6 (six) hours as needed (for pain.).    [provider]  allopurinol  (ZYLOPRIM ) 100 MG tablet Take 200 mg by mouth in the morning. 09/06/21   [provider]  amLODipine  (NORVASC ) 10 MG tablet Take 10 mg by mouth in the morning. 09/06/21   [provider]  atorvastatin  (LIPITOR) 10 MG tablet Take 10 mg by mouth at bedtime. 10/23/21   [provider]  clindamycin  (CLEOCIN ) 300 MG capsule Take 1 capsule (300 mg total) by mouth 3 (three) times daily. 08/26/24   Janit Thresa HERO, DPM  doxazosin  (CARDURA ) 4 MG tablet Take 4 mg by mouth at bedtime. 06/24/24   [provider]  empagliflozin  (JARDIANCE ) 25 MG TABS tablet Take 1 tablet by mouth daily. 02/06/23   [provider]  metFORMIN  (GLUCOPHAGE ) 1000 MG tablet Take 1,000 mg by mouth 2 (two) times daily. 08/24/21   [provider]  metoprolol  succinate (TOPROL -XL) 100 MG 24 hr tablet Take 100 mg by mouth at bedtime. 07/23/24   [provider]  OZEMPIC, 0.25 OR 0.5 MG/DOSE, 2 MG/3ML SOPN Inject 0.5 mg into the skin once a week. 03/19/24   [provider]  polyethylene glycol (MIRALAX ) 17 g packet Take 17 g by mouth daily. 08/18/24   Lanetta Lingo, MD  senna (SENOKOT) 8.6 MG TABS tablet Take 2 tablets (17.2 mg total) by mouth at bedtime. 08/18/24   Lanetta Lingo, MD  tadalafil (CIALIS) 5 MG tablet Take 5 mg by mouth daily as needed for erectile dysfunction.    [provider]  TRESIBA  FLEXTOUCH 200 UNIT/ML FlexTouch Pen Inject 54 Units into the skin in the morning and at bedtime. 10/27/21   [provider]    Physical Exam: Vitals:   10/19/24 1347 10/19/24 1348  BP:  (!) 154/89  Pulse: 80   Resp: 16   Temp: 97.8 F (36.6 C)   TempSrc: Oral   SpO2: 100%   Weight:  122.5 kg  Height:  5' 11 (1.803 m)    Constitutional: NAD, calm, comfortable Vitals:   10/19/24 1347 10/19/24 1348  BP:  (!) 154/89  Pulse: 80   Resp: 16   Temp: 97.8 F (36.6 C)   TempSrc: Oral   SpO2: 100%   Weight:  122.5 kg  Height:  5' 11 (1.803 m)   Eyes: PERRL, lids and conjunctivae normal ENMT: Mucous membranes are moist. Posterior pharynx clear of any exudate or lesions.Normal dentition.  Neck: normal, supple, no masses, no thyromegaly Respiratory: clear to auscultation bilaterally, no wheezing, no crackles. Normal respiratory effort. No accessory muscle use.  Cardiovascular: Regular rate and rhythm, no murmurs / rubs / gallops. No extremity edema. 2+ pedal pulses. No carotid bruits.  Abdomen: no tenderness, no masses palpated. No hepatosplenomegaly. Bowel sounds positive.  Musculoskeletal: no clubbing / cyanosis. No joint deformity upper and lower extremities. Good ROM, no contractures. Normal muscle tone.  Skin: Significant discoloration and edema of right second toe with heavy callus no  obvious ulcer noticed.  Nontender. Neurologic: CN 2-12 grossly intact. Sensation intact, DTR normal. Strength 5/5 in all 4.  Psychiatric: Normal judgment and insight. Alert and oriented x 3. Normal mood.    Labs on Admission: I have personally reviewed following labs and imaging studies  CBC: Recent Labs  Lab 10/19/24 1438  WBC 5.2  NEUTROABS 3.2  HGB 13.9  HCT 39.9  MCV 85.3  PLT 189   Basic Metabolic Panel: Recent Labs  Lab 10/19/24 1438  NA 143  K 3.9  CL 104  CO2 26  GLUCOSE 131*  BUN 18  CREATININE 0.84  CALCIUM  9.2   GFR: Estimated Creatinine Clearance: 132.4 mL/min (by C-G formula based on SCr of 0.84 mg/dL). Liver Function Tests: No results for input(s): AST, ALT, ALKPHOS, BILITOT, PROT, ALBUMIN in the last 168 hours. No results for input(s): LIPASE, AMYLASE in the last 168 hours. No results for input(s): AMMONIA in  the last 168 hours. Coagulation Profile: No results for input(s): INR, PROTIME in the last 168 hours. Cardiac Enzymes: No results for input(s): CKTOTAL, CKMB, CKMBINDEX, TROPONINI in the last 168 hours. BNP (last 3 results) No results for input(s): PROBNP in the last 8760 hours. HbA1C: No results for input(s): HGBA1C in the last 72 hours. CBG: No results for input(s): GLUCAP in the last 168 hours. Lipid Profile: No results for input(s): CHOL, HDL, LDLCALC, TRIG, CHOLHDL, LDLDIRECT in the last 72 hours. Thyroid Function Tests: No results for input(s): TSH, T4TOTAL, FREET4, T3FREE, THYROIDAB in the last 72 hours. Anemia Panel: No results for input(s): VITAMINB12, FOLATE, FERRITIN, TIBC, IRON, RETICCTPCT in the last 72 hours. Urine analysis:    Component Value Date/Time   COLORURINE STRAW (A) 08/15/2015 1333   APPEARANCEUR CLEAR (A) 08/15/2015 1333   LABSPEC 1.029 08/15/2015 1333   PHURINE 6.0 08/15/2015 1333   GLUCOSEU >500 (A) 08/15/2015 1333   HGBUR NEGATIVE  08/15/2015 1333   BILIRUBINUR NEGATIVE 08/15/2015 1333   KETONESUR TRACE (A) 08/15/2015 1333   PROTEINUR NEGATIVE 08/15/2015 1333   NITRITE NEGATIVE 08/15/2015 1333   LEUKOCYTESUR NEGATIVE 08/15/2015 1333    Radiological Exams on Admission: DG Foot Complete Right Result Date: 10/19/2024 EXAM: 3 OR MORE VIEW(S) XRAY OF THE RIGHT FOOT 10/19/2024 02:59:49 PM COMPARISON: 08/17/2024 CLINICAL HISTORY: Diabetic ulceration of right great toe. FINDINGS: BONES AND JOINTS: Old healed fracture of the third metatarsal. Erosive changes in the distal tuft of the second distal phalanx, concerning for osteomyelitis. Status post amputation of first digit at the level of the metatarsophalangeal joint. Calcaneal spur noted along the Achilles tendon and Plantar aponeurosis attachment sites. No acute fracture. No malalignment. SOFT TISSUES: Focal soft tissue swelling overlying the first metatarsophalangeal joint. IMPRESSION: 1. Focal soft tissue swelling overlying the first metatarsophalangeal joint. 2. Status post amputation of the first digit at the level of the metatarsophalangeal joint. 3. Erosive changes in the distal tuft of the second distal phalanx, suspicious for osteomyelitis. Electronically signed by: Elsie Gravely MD 10/19/2024 03:16 PM EST RP Workstation: [id number]    EKG: None  Assessment/Plan Principal Problem:   Acute osteomyelitis of toe of right foot (HCC) Active Problems:   Osteomyelitis of ankle or foot, acute, right (HCC)  (please populate well all problems here in Problem List. (For example, if patient is on BP meds at home and you resume or decide to hold them, it is a problem that needs to be her. Same for CAD, COPD, HLD and so on)   Right second toe osteomyelitis - Right second toe amputation tomorrow - Patient has no symptoms signs of sepsis, we will hold off antibiotics, blood culture sent.  IDDM with insulin  resistance - Hold off Lantus  - Start SSI - Continue  Jardiance   HTN - Controlled, continue amlodipine  and metoprolol   Gout - Stable, continue allopurinol   Morbid obesity - BMI= 37 - On Ozempic therapy  DVT prophylaxis: Lovenox  Code Status: Full code Family Communication: None at bedside Disposition Plan: Patient stable with right second toe osteomyelitis requiring inpatient podiatry procedure, expect more than 2 midnight hospital stay. Consults called: Podiatry Dr. Malvin Admission status: MedSurg admission   Cort ONEIDA Mana MD [facility] Pager [phone]  10/19/2024, 4:04 PM       [1] No Known Allergies  "

## 2024-10-19 NOTE — ED Notes (Signed)
 Advised nurse that patient has ready bed

## 2024-10-19 NOTE — ED Notes (Addendum)
 2nd toe on right foot is very swollen and obviously infected. Pt states his right great toe was amputated less than a month ago and he thinks the infection spread to the second toe despite antibiotic therapy. Pt reports pus coming from toe last night.

## 2024-10-19 NOTE — Progress Notes (Signed)
 PHARMACIST - PHYSICIAN COMMUNICATION  CONCERNING:  Enoxaparin  (Lovenox ) for DVT Prophylaxis    RECOMMENDATION: Patient was prescribed enoxaprin 40mg  q24 hours for VTE prophylaxis.   Filed Weights   10/19/24 1348  Weight: 122.5 kg (270 lb)    Body mass index is 37.66 kg/m.  Estimated Creatinine Clearance: 132.4 mL/min (by C-G formula based on SCr of 0.84 mg/dL).   Based on [facility] policy patient is candidate for enoxaparin  0.5mg /kg TBW SQ every 24 hours based on BMI being >30.  DESCRIPTION: Pharmacy has adjusted enoxaparin  dose per [facility] policy.  Patient is now receiving enoxaparin  62.5 mg every 24 hours    Olam KANDICE Fritter, PharmD Clinical Pharmacist  10/19/2024 4:05 PM

## 2024-10-19 NOTE — ED Triage Notes (Signed)
 Pt to ED via POV. Pt c/o infection in his right 3rd toe. Pt states that he has lost toes on that foot previously due to infection. Pt is diabetic. Pt is in NAD.

## 2024-10-20 ENCOUNTER — Inpatient Hospital Stay: Payer: PRIVATE HEALTH INSURANCE | Admitting: Anesthesiology

## 2024-10-20 ENCOUNTER — Encounter: Admission: EM | Disposition: A | Payer: Self-pay | Source: Home / Self Care

## 2024-10-20 ENCOUNTER — Inpatient Hospital Stay: Payer: PRIVATE HEALTH INSURANCE

## 2024-10-20 ENCOUNTER — Encounter: Payer: Self-pay | Admitting: Internal Medicine

## 2024-10-20 DIAGNOSIS — M86171 Other acute osteomyelitis, right ankle and foot: Secondary | ICD-10-CM | POA: Diagnosis not present

## 2024-10-20 LAB — GLUCOSE, CAPILLARY
Glucose-Capillary: 108 mg/dL — ABNORMAL HIGH (ref 70–99)
Glucose-Capillary: 99 mg/dL (ref 70–99)
Glucose-Capillary: 83 mg/dL (ref 70–99)
Glucose-Capillary: 82 mg/dL (ref 70–99)
Glucose-Capillary: 75 mg/dL (ref 70–99)
Glucose-Capillary: 88 mg/dL (ref 70–99)

## 2024-10-20 MED ORDER — DROPERIDOL 2.5 MG/ML IJ SOLN: 0.6250 mg | Freq: Once | INTRAMUSCULAR | Status: DC | PRN

## 2024-10-20 MED ORDER — MIDAZOLAM HCL 2 MG/2ML IJ SOLN
INTRAMUSCULAR | Status: AC
  Filled 2024-10-20: qty 2

## 2024-10-20 MED ORDER — ACETAMINOPHEN 10 MG/ML IV SOLN: 1000.0000 mg | Freq: Once | INTRAVENOUS | Status: DC | PRN

## 2024-10-20 MED ORDER — BUPIVACAINE HCL 0.5 % IJ SOLN
INTRAMUSCULAR | Status: DC | PRN
  Administered 2024-10-20: 5 mL

## 2024-10-20 MED ORDER — PROPOFOL 1000 MG/100ML IV EMUL
INTRAVENOUS | Status: AC
  Filled 2024-10-20: qty 100

## 2024-10-20 MED ORDER — MIDAZOLAM HCL (PF) 2 MG/2ML IJ SOLN
INTRAMUSCULAR | Status: DC | PRN
  Administered 2024-10-20: 2 mg via INTRAVENOUS

## 2024-10-20 MED ORDER — PHENYLEPHRINE 80 MCG/ML (10ML) SYRINGE FOR IV PUSH (FOR BLOOD PRESSURE SUPPORT)
PREFILLED_SYRINGE | INTRAVENOUS | Status: AC
  Filled 2024-10-20: qty 10

## 2024-10-20 MED ORDER — OXYCODONE HCL 5 MG/5ML PO SOLN: 5.0000 mg | Freq: Once | ORAL | Status: DC | PRN

## 2024-10-20 MED ORDER — PROPOFOL 10 MG/ML IV BOLUS
INTRAVENOUS | Status: AC
  Filled 2024-10-20: qty 20

## 2024-10-20 MED ORDER — FENTANYL CITRATE (PF) 100 MCG/2ML IJ SOLN: 25.0000 ug | INTRAMUSCULAR | Status: DC | PRN

## 2024-10-20 MED ORDER — OXYCODONE HCL 5 MG PO TABS: 5.0000 mg | ORAL_TABLET | Freq: Once | ORAL | Status: DC | PRN

## 2024-10-20 MED ORDER — SODIUM CHLORIDE 0.9 % IV SOLN: INTRAVENOUS | Status: DC | PRN

## 2024-10-20 MED ORDER — CEFAZOLIN SODIUM 1 G IJ SOLR
INTRAMUSCULAR | Status: AC
  Filled 2024-10-20: qty 10

## 2024-10-20 MED ORDER — AMOXICILLIN-POT CLAVULANATE 875-125 MG PO TABS
1.0000 | ORAL_TABLET | Freq: Two times a day (BID) | ORAL | Status: DC
  Administered 2024-10-20 – 2024-10-21 (×3): 1 via ORAL
  Filled 2024-10-20 (×4): qty 1

## 2024-10-20 MED ORDER — LIDOCAINE HCL (PF) 1 % IJ SOLN
INTRAMUSCULAR | Status: AC
  Filled 2024-10-20: qty 30

## 2024-10-20 MED ORDER — BUPIVACAINE HCL (PF) 0.5 % IJ SOLN
INTRAMUSCULAR | Status: AC
  Filled 2024-10-20: qty 30

## 2024-10-20 MED ORDER — CEFAZOLIN SODIUM-DEXTROSE 1-4 GM/50ML-% IV SOLN
INTRAVENOUS | Status: DC | PRN
  Administered 2024-10-20: 2 g via INTRAVENOUS

## 2024-10-20 MED ORDER — EPHEDRINE 5 MG/ML INJ
INTRAVENOUS | Status: AC
  Filled 2024-10-20: qty 5

## 2024-10-20 MED ORDER — PIPERACILLIN-TAZOBACTAM 3.375 G IVPB
3.3750 g | Freq: Three times a day (TID) | INTRAVENOUS | Status: DC
  Administered 2024-10-20: 3.375 g via INTRAVENOUS
  Filled 2024-10-20: qty 50

## 2024-10-20 MED ORDER — 0.9 % SODIUM CHLORIDE (POUR BTL) OPTIME
TOPICAL | Status: DC | PRN
  Administered 2024-10-20: 500 mL

## 2024-10-20 MED ORDER — PROPOFOL 500 MG/50ML IV EMUL
INTRAVENOUS | Status: DC | PRN
  Administered 2024-10-20: 50 mg via INTRAVENOUS
  Administered 2024-10-20: 150 ug/kg/min via INTRAVENOUS

## 2024-10-20 MED ORDER — DEXAMETHASONE SOD PHOSPHATE PF 10 MG/ML IJ SOLN
INTRAMUSCULAR | Status: AC
  Filled 2024-10-20: qty 1

## 2024-10-20 MED ORDER — LIDOCAINE HCL 1 % IJ SOLN
INTRAMUSCULAR | Status: DC | PRN
  Administered 2024-10-20: 5 mL

## 2024-10-20 MED ORDER — ACETAMINOPHEN 10 MG/ML IV SOLN
INTRAVENOUS | Status: AC
  Filled 2024-10-20: qty 100

## 2024-10-20 NOTE — Anesthesia Preprocedure Evaluation (Addendum)
 "                                  Anesthesia Evaluation  Patient identified by MRN, date of birth, ID band Patient awake    Reviewed: Allergy & Precautions, NPO status , Patient's Chart, lab work & pertinent test results  History of Anesthesia Complications Negative for: history of anesthetic complications  Airway Mallampati: III  TM Distance: >3 FB Neck ROM: Full    Dental  (+) Dental Advisory Given, Teeth Intact   Pulmonary neg pulmonary ROS   Pulmonary exam normal        Cardiovascular hypertension, Pt. on medications Normal cardiovascular exam     Neuro/Psych  Neuromuscular disease (Peripheral nueropathy)  negative psych ROS   GI/Hepatic negative GI ROS, Neg liver ROS,,,  Endo/Other  diabetes, Type 2, Oral Hypoglycemic Agents, Insulin  Dependent    Renal/GU negative Renal ROS  negative genitourinary   Musculoskeletal   Abdominal  (+) + obese  Peds  Hematology negative hematology ROS (+)   Anesthesia Other Findings  Past Medical History: No date: Azoospermia No date: Gout     Comment:  per pt stable as of 03-24-2016 No date: Hypertension No date: Hypogonadism, testicular No date: Type 2 diabetes mellitus (HCC) No date: Wears glasses  Past Surgical History: age 71 and 41: BREAST REDUCTION SURGERY 2011: COLONOSCOPY 12/15/2021: GRAFT APPLICATION; Right     Comment:  Procedure: GRAFT APPLICATION;  Surgeon: Tye Millet,               DO;  Location: [facility];  Service: General;  Laterality:               Right; 03/07/2022: SKIN SPLIT GRAFT; Right     Comment:  Procedure: SKIN GRAFT SPLIT THICKNESS;  Surgeon: Elisabeth Craig RAMAN, MD;  Location: [facility];                Service: Plastics;  Laterality: Right; 04/04/2016: TESTICLE BIOPSY; N/A     Comment:  Procedure: BIOPSY TESTICULAR;  Surgeon: Donnice Brooks, MD;  Location: [facility];                Service: Urology;  Laterality:  N/A; 11/02/2021: WOUND DEBRIDEMENT; Right     Comment:  Procedure: DEBRIDEMENT WOUND;  Surgeon: Tye Millet,               DO;  Location: [facility];  Service: General;  Laterality:               Right;  BMI    Body Mass Index: 38.35 kg/m      Reproductive/Obstetrics negative OB ROS                              Anesthesia Physical Anesthesia Plan  ASA: 3  Anesthesia Plan: General   Post-op Pain Management: Minimal or no pain anticipated   Induction: Intravenous  PONV Risk Score and Plan: 2 and Propofol  infusion and TIVA  Airway Management Planned: Nasal Cannula and Natural Airway  Additional Equipment: None  Intra-op Plan:   Post-operative Plan:   Informed Consent: I have reviewed the patients History and Physical, chart, labs and discussed  the procedure including the risks, benefits and alternatives for the proposed anesthesia with the patient or authorized representative who has indicated his/her understanding and acceptance.       Plan Discussed with: CRNA and Surgeon  Anesthesia Plan Comments:          Anesthesia Quick Evaluation  "

## 2024-10-20 NOTE — Transfer of Care (Signed)
 Immediate Anesthesia Transfer of Care Note  Patient: Robert Brennan  Procedure(s) Performed: AMPUTATION, TOE (Right: Toe)  Patient Location: PACU  Anesthesia Type:MAC  Level of Consciousness: drowsy  Airway & Oxygen Therapy: Patient Spontanous Breathing  Post-op Assessment: Report given to RN and Post -op Vital signs reviewed and stable  Post vital signs: Reviewed and stable  Last Vitals:  Vitals Value Taken Time  BP 112/66 10/20/24 09:30  Temp    Pulse 80 10/20/24 09:31  Resp 19 10/20/24 09:31  SpO2 97 % 10/20/24 09:31  Vitals shown include unfiled device data.  Last Pain:  Vitals:   10/20/24 0844  TempSrc:   PainSc: 0-No pain         Complications: There were no known notable events for this encounter.

## 2024-10-20 NOTE — Op Note (Signed)
 Full Operative Report  Date of Operation: 8:44 AM, 10/20/2024   Patient: Robert Brennan - 54 y.o. male  Surgeon: Malvin Marsa FALCON, DPM   Assistant: None  Diagnosis: Osteomylitis  Procedure:  1.  Right second toe amputation at MPJ level    Anesthesia: General  No responsible provider has been recorded for the case.  Anesthesiologist: Vicci Robert Glatter, MD CRNA: Bonnetta Jimmey SAUNDERS, CRNA   Estimated Blood Loss: Minimal   Hemostasis: 1) Anatomical dissection, mechanical compression, electrocautery 2) no tourniquet was used in procedure  Implants: * No implants in log *  Materials: Prolene 3-0  Injectables: 1) Pre-operatively: 10 cc of 50:50 mixture 1%lidocaine  plain and 0.5% marcaine  plain 2) Post-operatively: None   Specimens: - Pathology: Right second toe - Microbiology: Tissue culture distal right second toe   Antibiotics: IV antibiotics given per schedule on the floor  Drains: None  Complications: Patient tolerated the procedure well without complication.   Operative findings: As below in detailed report  Indications for Procedure: Robert Brennan presents to Malvin Marsa FALCON, DPM with a chief complaint of ulceration erythema and edema of the right second toe concerning for osteomyelitis and x-ray of the distal phalanx the patient has failed conservative treatments of various modalities. At this time the patient has elected to proceed with surgical correction. All alternatives, risks, and complications of the procedures were thoroughly explained to the patient. Patient exhibits appropriate understanding of all discussion points and informed consent was signed and obtained in the chart with no guarantees to surgical outcome given or implied.  Description of Procedure: Patient was brought to the operating room. Patient remained on their hospital bed in the supine position. A surgical timeout was performed and all members of the operating room, the  procedure, and the surgical site were identified. anesthesia occurred as per anesthesia record. Local anesthetic as previously described was then injected about the operative field in a local infiltrative block.  The operative lower extremity as noted above was then prepped and draped in the usual sterile manner. The following procedure then began.  Attention was directed to the 2nd digit on the right foot. A full-thickness incision encompassing the entire digit was made using a #15 blade. Dissection was carried down to bone. The toe was secured with a towel clamp, further dissected in its entirety, and disarticulated at the MPJ and passed to the back table as a gross specimen. This was then labled and sent to pathology. The bone was noted to be soft and eroded, and consistent with osteomyelitis. All remaining necrotic and devitalized soft tissue structures were visualized and dissected away using sharp and dull dissection. Care was taken to protect all neurovascular structures throughout the dissection. All bleeders were cauterized as necessary. A deep tissue culture was obtained at this time. The area was then flushed with copious amounts of sterile saline. Then using the suture materials previously described, the site was closed in anatomic layers and the skin was well approximated under minimal tension.   The surgical site was then dressed with Xeroform 4 x 4 Kerlix Ace. The patient tolerated both the procedure and anesthesia well with vital signs stable throughout. The patient was transferred in good condition and all vital signs stable  from the OR to recovery under the discretion of anesthesia.  Condition: Vital signs stable, neurovascular status unchanged from preoperative   Surgical plan:  Expect clean margin.  Recommend 5 days p.o. Augmentin  from discharge.  Will be stable for discharge home tomorrow.  Leave dressings clean dry and intact until follow-up in the office 1 week from today.  The  patient will be weightbearing as tolerated in a postop shoe to the operative limb until further instructed. The dressing is to remain clean, dry, and intact. Will continue to follow unless noted elsewhere.   Marsa Honour, DPM [facility]

## 2024-10-20 NOTE — Plan of Care (Signed)
   Problem: Coping: Goal: Ability to adjust to condition or change in health will improve Outcome: Progressing   Problem: Fluid Volume: Goal: Ability to maintain a balanced intake and output will improve Outcome: Progressing

## 2024-10-20 NOTE — Consult Note (Signed)
 "  PODIATRY CONSULTATION  NAME Robert Brennan MRN [id number] DOB 08-01-1971 DOA 10/19/2024   Reason for consult:  Chief Complaint  Patient presents with   Foot Pain    Attending/Consulting physician: S. Ponnala MD  History of present illness: Robert Brennan is a 54 y.o. male with medical history significant of IDDM with insulin  resistance, HTN, HLD, gout, previous DVT, morbid obesity, diabetic foot ulcer status post recent right great toe amputation, presented with right second toe infection.  Discussed with patient x-ray findings concerning for osteomyelitis the distal aspect of the right second toe.  Discussed amputation with him.  He figured this was going to need to happen.  He agrees to proceed with right second toe amputation.  We discussed aftercare and follow-up plans after surgery.  He had no questions for me.  He had no concerns.  Past Medical History:  Diagnosis Date   Azoospermia    Gout    per pt stable as of 03-24-2016   Hypertension    Hypogonadism, testicular    Type 2 diabetes mellitus (HCC)    Wears glasses        Latest Ref Rng & Units 10/19/2024    2:38 PM 08/18/2024    9:26 AM 08/15/2024    4:59 PM  CBC  WBC 4.0 - 10.5 K/uL 5.2  4.9  7.3   Hemoglobin 13.0 - 17.0 g/dL 86.0  86.8  87.0   Hematocrit 39.0 - 52.0 % 39.9  39.4  37.9   Platelets 150 - 400 K/uL 189  187  193        Latest Ref Rng & Units 10/19/2024    2:38 PM 08/18/2024    9:26 AM 08/15/2024    4:59 PM  BMP  Glucose 70 - 99 mg/dL 868  816  824   BUN 6 - 20 mg/dL 18  16  15    Creatinine 0.61 - 1.24 mg/dL 9.15  8.91  8.81   Sodium 135 - 145 mmol/L 143  137  138   Potassium 3.5 - 5.1 mmol/L 3.9  3.7  4.0   Chloride 98 - 111 mmol/L 104  104  102   CO2 22 - 32 mmol/L 26  21  24    Calcium  8.9 - 10.3 mg/dL 9.2  8.6  9.2       Physical Exam: Lower Extremity Exam  R second toe erythema and edema   Distal ulceation   Eythema extending to MPJ level  DP and PT palpable  Sensation diminished  to absent     ASSESSMENT/PLAN OF CARE 54 y.o. male with PMHx significant for  IDDM with insulin  resistance, HTN, HLD, gout, previous DVT, morbid obesity, diabetic foot ulcer status post recent right great toe amputation  with ulceration and osteomyelitis with secondary cellulitis of the right second toe.  - N.p.o. for OR this morning for right second toe amputation partial versus total.  He agrees to proceed after discussion risk benefits alternatives and possible complications - Continue IV abx broad spectrum pending further culture data, okay to switch to 5 days p.o. antibiotics Augmentin  after surgery as I expect to achieve source control - Anticoagulation: Okay to resume postop - Wound care: None required postoperatively leave surgical dressing clean dry and intact until follow-up next week - WB status: He will be weightbearing as tolerated in postop shoe after surgery -Patient will be ready for discharge later today or early tomorrow he says he would prefer to go home tomorrow.  He will  be clear for discharge from my standpoint when cleared by primary with follow-up will be arranged for him next week in the office for first dressing change. - Will continue to follow   Thank you for the consult.  Please contact me directly with any questions or concerns.           Robert Brennan, DPM [facility]    [address] [place], [place] [address]                Office [phone]  Fax [phone]     "

## 2024-10-20 NOTE — Progress Notes (Signed)
 " PROGRESS NOTE    Robert Brennan  FMW:[id number] DOB: [birth year] DOA: 10/19/2024 PCP: Jannetta Donnice Lenis, MD  Chief Complaint  Patient presents with   Foot Pain    Hospital Course:  Robert Brennan is a 54 y.o. male with medical history significant of IDDM with insulin  resistance, HTN, HLD, gout, previous DVT, obesity, diabetic foot ulcer status post recent right great toe amputation, presented with right second toe infection.  Patient is status post right big toe amputation on 08/17/2024.  Patient admitted for ulceration and osteomyelitis with secondary cellulitis of the right second toe  Subjective: Patient was examined at the bedside, new to me today. Underwent right second toe amputation at MPJ level by podiatry today Anticipate discharge tomorrow   Objective: Vitals:   10/19/24 2053 10/19/24 2121 10/20/24 0356 10/20/24 0844  BP: (!) 156/86 (!) 141/68 133/79 136/79  Pulse: 71 76 61 60  Resp: 16  16 16   Temp: 97.9 F (36.6 C) 98.7 F (37.1 C) 97.9 F (36.6 C)   TempSrc: Oral Oral    SpO2: 100%  98% 96%  Weight:      Height:       No intake or output data in the 24 hours ending 10/20/24 0900 Filed Weights   10/19/24 1348  Weight: 122.5 kg    Examination: General: NAD, calm, comfortable Neck: normal, supple, no masses, no thyromegaly Respiratory: clear to auscultation bilaterally, no wheezing, no crackles Cardiovascular: Regular rate and rhythm, no murmurs / rubs / gallops  Abdomen: no tenderness, no masses palpated Skin: RLE surgical dressing Neurologic: CN 2-12 grossly intact. Sensation intact, DTR normal. Strength 5/5 in all 4.  Psychiatric: Normal judgment and insight. Alert and oriented x 3. Normal mood  Assessment & Plan:  Principal Problem:   Acute osteomyelitis of toe of right foot (HCC) Active Problems:   Osteomyelitis of ankle or foot, acute, right (HCC)  Right second toe osteomyelitis Right second toe cellulitis By podiatry, appreciate recs S/p right  second toe amputation at MPJ level 01/26 Surgical pathology pending Recommend 5 days Augmentin  at discharge Follow-up with podiatry outpatient in 1 week Follow-up blood cultures WBAT in postop shoe   IDDM with insulin  resistance HbA1c 6.7 On Latus 54 units daily (takes bid at home) SSI, Jardiance    HTN Controlled, continue amlodipine  and metoprolol    Gout Stable, continue allopurinol    Obesity class II BMI= 37.6 On Ozempic Outpatient follow up for lifestyle modification and risk factor management   DVT prophylaxis: Lovenox  SQ   Code Status: Full Code Disposition:  Home  Consultants:  Podiatry  Procedures:  right second toe amputation at MPJ level 01/26  Antimicrobials:  Anti-infectives (From admission, onward)    None       Data Reviewed: I have personally reviewed following labs and imaging studies CBC: Recent Labs  Lab 10/19/24 1438  WBC 5.2  NEUTROABS 3.2  HGB 13.9  HCT 39.9  MCV 85.3  PLT 189   Basic Metabolic Panel: Recent Labs  Lab 10/19/24 1438  NA 143  K 3.9  CL 104  CO2 26  GLUCOSE 131*  BUN 18  CREATININE 0.84  CALCIUM  9.2   GFR: Estimated Creatinine Clearance: 132.4 mL/min (by C-G formula based on SCr of 0.84 mg/dL). Liver Function Tests: No results for input(s): AST, ALT, ALKPHOS, BILITOT, PROT, ALBUMIN in the last 168 hours. CBG: Recent Labs  Lab 10/19/24 1717 10/19/24 2103 10/20/24 0848  GLUCAP 89 88 108*    Recent Results (from the past  240 hours)  Blood culture (routine x 2)     Status: None (Preliminary result)   Collection Time: 10/19/24  4:47 PM   Specimen: BLOOD  Result Value Ref Range Status   Specimen Description BLOOD RIGHT ANTECUBITAL  Final   Special Requests   Final    BOTTLES DRAWN AEROBIC AND ANAEROBIC Blood Culture results may not be optimal due to an inadequate volume of blood received in culture bottles   Culture   Final    NO GROWTH < 12 HOURS Performed at [facility], [address] [place], [place] [address]    Report Status PENDING  Incomplete  Blood culture (routine x 2)     Status: None (Preliminary result)   Collection Time: 10/19/24  4:47 PM   Specimen: BLOOD  Result Value Ref Range Status   Specimen Description BLOOD BLOOD RIGHT HAND  Final   Special Requests   Final    BOTTLES DRAWN AEROBIC AND ANAEROBIC Blood Culture results may not be optimal due to an inadequate volume of blood received in culture bottles   Culture   Final    NO GROWTH < 12 HOURS Performed at [facility], [address] [place], [place] [address]    Report Status PENDING  Incomplete     Radiology Studies: DG Foot Complete Right Result Date: 10/19/2024 EXAM: 3 OR MORE VIEW(S) XRAY OF THE RIGHT FOOT 10/19/2024 02:59:49 PM COMPARISON: 08/17/2024 CLINICAL HISTORY: Diabetic ulceration of right great toe. FINDINGS: BONES AND JOINTS: Old healed fracture of the third metatarsal. Erosive changes in the distal tuft of the second distal phalanx, concerning for osteomyelitis. Status post amputation of first digit at the level of the metatarsophalangeal joint. Calcaneal spur noted along the Achilles tendon and Plantar aponeurosis attachment sites. No acute fracture. No malalignment. SOFT TISSUES: Focal soft tissue swelling overlying the first metatarsophalangeal joint. IMPRESSION: 1. Focal soft tissue swelling overlying the first metatarsophalangeal joint. 2. Status post amputation of the first digit at the level of the metatarsophalangeal joint. 3. Erosive changes in the distal tuft of the second distal phalanx, suspicious for osteomyelitis. Electronically signed by: Elsie Gravely MD 10/19/2024 03:16 PM EST RP Workstation: [id number]    Scheduled Meds:  [MAR Hold] allopurinol   200 mg Oral q AM   [MAR Hold] amLODipine   10 mg Oral q AM   [MAR Hold] atorvastatin   10 mg Oral QHS   [MAR Hold] doxazosin   4 mg Oral QHS   [MAR Hold] empagliflozin   25 mg Oral Daily   [MAR Hold]  enoxaparin  (LOVENOX ) injection  0.5 mg/kg Subcutaneous Q24H   [MAR Hold] insulin  aspart  0-20 Units Subcutaneous TID WC   [MAR Hold] insulin  aspart  0-5 Units Subcutaneous QHS   [MAR Hold] insulin  glargine  54 Units Subcutaneous Daily   [MAR Hold] metFORMIN   1,000 mg Oral BID WC   [MAR Hold] metoprolol  succinate  100 mg Oral QHS   [MAR Hold] polyethylene glycol  17 g Oral Daily   [MAR Hold] senna  2 tablet Oral QHS   Continuous Infusions:   LOS: 1 day  MDM: Patient is high risk for one or more organ failure.  They necessitate ongoing hospitalization for continued IV therapies and subsequent lab monitoring. Total time spent interpreting labs and vitals, reviewing the medical record, coordinating care amongst consultants and care team members, directly assessing and discussing care with the patient and/or family: 55 min Laree Lock, MD [facility]  To contact the attending physician between 7A-7P  please use Epic Chat. To contact the covering physician during after hours 7P-7A, please review Amion.  10/20/2024, 9:00 AM   *This document has been created with the assistance of dictation software. Please excuse typographical errors. *   "

## 2024-10-21 ENCOUNTER — Other Ambulatory Visit: Payer: Self-pay

## 2024-10-21 ENCOUNTER — Encounter: Payer: Self-pay | Admitting: Podiatry

## 2024-10-21 LAB — CBC
HCT: 37.2 % — ABNORMAL LOW (ref 39.0–52.0)
Hemoglobin: 13.1 g/dL (ref 13.0–17.0)
MCH: 30.2 pg (ref 26.0–34.0)
MCHC: 35.2 g/dL (ref 30.0–36.0)
MCV: 85.7 fL (ref 80.0–100.0)
Platelets: 182 10*3/uL (ref 150–400)
RBC: 4.34 MIL/uL (ref 4.22–5.81)
RDW: 13 % (ref 11.5–15.5)
WBC: 4.1 10*3/uL (ref 4.0–10.5)
nRBC: 0 % (ref 0.0–0.2)

## 2024-10-21 LAB — BASIC METABOLIC PANEL WITH GFR
Anion gap: 11 (ref 5–15)
BUN: 17 mg/dL (ref 6–20)
CO2: 25 mmol/L (ref 22–32)
Calcium: 9.1 mg/dL (ref 8.9–10.3)
Chloride: 105 mmol/L (ref 98–111)
Creatinine, Ser: 1.09 mg/dL (ref 0.61–1.24)
GFR, Estimated: >60 mL/min
Glucose, Bld: 110 mg/dL — ABNORMAL HIGH (ref 70–99)
Potassium: 3.9 mmol/L (ref 3.5–5.1)
Sodium: 141 mmol/L (ref 135–145)

## 2024-10-21 LAB — GLUCOSE, CAPILLARY
Glucose-Capillary: 132 mg/dL — ABNORMAL HIGH (ref 70–99)
Glucose-Capillary: 70 mg/dL (ref 70–99)

## 2024-10-21 MED ORDER — TRESIBA FLEXTOUCH 200 UNIT/ML ~~LOC~~ SOPN: 54.0000 [IU] | PEN_INJECTOR | Freq: Every day | SUBCUTANEOUS | Status: AC

## 2024-10-21 MED ORDER — ACETAMINOPHEN 325 MG PO TABS
650.0000 mg | ORAL_TABLET | Freq: Four times a day (QID) | ORAL | 0 refills | Status: AC | PRN
  Filled 2024-10-21: qty 30, 4d supply, fill #0

## 2024-10-21 MED ORDER — OXYCODONE HCL 5 MG PO TABS
5.0000 mg | ORAL_TABLET | ORAL | 0 refills | Status: AC | PRN
  Filled 2024-10-21: qty 12, 2d supply, fill #0

## 2024-10-21 MED ORDER — AMOXICILLIN-POT CLAVULANATE 875-125 MG PO TABS
1.0000 | ORAL_TABLET | Freq: Two times a day (BID) | ORAL | 0 refills | Status: AC
  Filled 2024-10-21: qty 8, 4d supply, fill #0

## 2024-10-21 NOTE — Anesthesia Postprocedure Evaluation (Signed)
"   Anesthesia Post Note  Patient: [name]  Procedure(s) Performed: AMPUTATION, TOE (Right: Toe)  Patient location during evaluation: PACU Anesthesia Type: General Level of consciousness: awake and alert Pain management: pain level controlled Vital Signs Assessment: post-procedure vital signs reviewed and stable Respiratory status: spontaneous breathing, nonlabored ventilation and respiratory function stable Cardiovascular status: blood pressure returned to baseline and stable Postop Assessment: no apparent nausea or vomiting Anesthetic complications: no   There were no known notable events for this encounter.   Last Vitals:  Vitals:   10/21/24 0500 10/21/24 0737  BP: 135/83 (!) 146/80  Pulse: 73 76  Resp: 16 16  Temp: 36.8 C 36.8 C  SpO2: 98% 99%    Last Pain:  Vitals:   10/21/24 0702  TempSrc:   PainSc: 0-No pain                 Camellia Merilee Louder      "

## 2024-10-21 NOTE — Plan of Care (Signed)

## 2024-10-21 NOTE — Discharge Summary (Signed)
 " Physician Discharge Summary   Patient: Robert Brennan MRN: [id number] DOB: 05-15-1971  Admit date:     10/19/2024  Discharge date: 10/21/24  Discharge Physician: Laree Lock   PCP: Jannetta Donnice Lenis, MD   Recommendations at discharge:   Follow-up with PCP -within 1-weeks -monitor BP, CBG  Follow-up with podiatry within 1 week  Discharge Diagnoses: Principal Problem:   Acute osteomyelitis of toe of right foot (HCC) Active Problems:   Osteomyelitis of ankle or foot, acute, right [facility])  Hospital Course: Robert Brennan is a 54 y.o. male with medical history significant of IDDM with insulin  resistance, HTN, HLD, gout, previous DVT, obesity, diabetic foot ulcer status post recent right great toe amputation, presented with right second toe infection.  Patient is status post right big toe amputation on 08/17/2024.  Patient admitted for ulceration and osteomyelitis with secondary cellulitis of the right second toe.  Hospital course as below  Right second toe osteomyelitis Right second toe cellulitis Seen by podiatry, S/p right second toe amputation at MPJ level 01/26 Surgical pathology pending Recommend 5 days Augmentin  at discharge Pain management with Tylenol , oxycodone  as needed Follow-up with podiatry outpatient in 1 week WBAT in postop shoe   IDDM with insulin  resistance HbA1c 6.7 Discharge on Latus 54 units daily ( ? takes bid at home) - controlled Continue metformin , Jardiance  Follow-up with PCP   HTN continue amlodipine  and metoprolol    Gout continue allopurinol    Obesity class II BMI= 37.6 On Mounjaro Outpatient follow up for lifestyle modification and risk factor management     Pain control - [place]  Controlled Substance Reporting System database was reviewed. and patient was instructed, not to drive, operate heavy machinery, perform activities at heights, swimming or participation in water activities or provide baby-sitting services while on Pain, Sleep  and Anxiety Medications; until their outpatient Physician has advised to do so again. Also recommended to not to take more than prescribed Pain, Sleep and Anxiety Medications.  Consultants: Podiatry Procedures performed: right second toe amputation at MPJ level 01/26  Disposition: Home Diet recommendation:  Discharge Diet Orders (From admission, onward)     Start     Ordered   10/21/24 0000  Diet - low sodium heart healthy        10/21/24 1038   10/21/24 0000  Diet Carb Modified        10/21/24 1038            DISCHARGE MEDICATION: Allergies as of 10/21/2024   No Known Allergies      Medication List     STOP taking these medications    clindamycin  300 MG capsule Commonly known as: Cleocin    Ozempic (0.25 or 0.5 MG/DOSE) 2 MG/3ML Sopn Generic drug: Semaglutide(0.25 or 0.5MG /DOS)       TAKE these medications    acetaminophen  325 MG tablet Commonly known as: TYLENOL  Take 2 tablets (650 mg total) by mouth every 6 (six) hours as needed for mild pain (pain score 1-3) or fever (or Fever >/= 101). What changed:  medication strength how much to take reasons to take this   allopurinol  100 MG tablet Commonly known as: ZYLOPRIM  Take 200 mg by mouth in the morning.   amLODipine  10 MG tablet Commonly known as: NORVASC  Take 10 mg by mouth in the morning.   amoxicillin -clavulanate 875-125 MG tablet Commonly known as: AUGMENTIN  Take 1 tablet by mouth every 12 (twelve) hours for 4 days.   atorvastatin  10 MG tablet Commonly known as: LIPITOR Take  10 mg by mouth at bedtime.   doxazosin  4 MG tablet Commonly known as: CARDURA  Take 4 mg by mouth at bedtime.   empagliflozin  25 MG Tabs tablet Commonly known as: JARDIANCE  Take 1 tablet by mouth daily.   metFORMIN  1000 MG tablet Commonly known as: GLUCOPHAGE  Take 1,000 mg by mouth 2 (two) times daily.   metoprolol  succinate 100 MG 24 hr tablet Commonly known as: TOPROL -XL Take 100 mg by mouth at bedtime.    Mounjaro 2.5 MG/0.5ML Pen Generic drug: tirzepatide Inject 2.5 mg into the skin once a week.   oxyCODONE  5 MG immediate release tablet Commonly known as: Oxy IR/ROXICODONE  Take 1 tablet (5 mg total) by mouth every 4 (four) hours as needed for moderate pain (pain score 4-6) or severe pain (pain score 7-10).   polyethylene glycol 17 g packet Commonly known as: MiraLax  Take 17 g by mouth daily.   senna 8.6 MG Tabs tablet Commonly known as: SENOKOT Take 2 tablets (17.2 mg total) by mouth at bedtime.   tadalafil 5 MG tablet Commonly known as: CIALIS Take 5 mg by mouth daily as needed for erectile dysfunction.   Tresiba  FlexTouch 200 UNIT/ML FlexTouch Pen Generic drug: insulin  degludec Inject 54 Units into the skin daily. What changed: when to take this        Discharge Exam: Filed Weights   10/19/24 1348  Weight: 122.5 kg   General: NAD, calm, comfortable Neck: normal, supple, no masses, no thyromegaly Respiratory: clear to auscultation bilaterally, no wheezing, no crackles Cardiovascular: Regular rate and rhythm, no murmurs / rubs / gallops  Abdomen: no tenderness, no masses palpated Skin: RLE surgical dressing Neurologic: CN 2-12 grossly intact. Sensation intact, DTR normal. Strength 5/5 in all 4.  Psychiatric: Normal judgment and insight. Alert and oriented x 3. Normal mood  Condition at discharge: good  The results of significant diagnostics from this hospitalization (including imaging, microbiology, ancillary and laboratory) are listed below for reference.   Imaging Studies: DG Foot 2 Views Right Result Date: 10/20/2024 CLINICAL DATA:  [id number] Post-operative state 747648 Status post right 2nd toe amputation EXAM: RIGHT FOOT - 2 VIEW COMPARISON:  Radiographs 10/19/2024 and 08/26/2024 FINDINGS: Interval amputation of the 2nd toe at the metatarsophalangeal joint. Previous amputation of the great toe. No evidence of acute fracture, dislocation or bone destruction. Stable  chronic posttraumatic deformity of the 3rd metatarsal and chronic flattening of the 1st metatarsal head. There is mild nonspecific forefoot soft tissue swelling without evidence of unexpected foreign body or soft tissue emphysema. IMPRESSION: Interval amputation of the 2nd toe at the metatarsophalangeal joint. No demonstrated complication. Electronically Signed   By: Elsie Perone M.D.   On: 10/20/2024 12:55   DG Foot Complete Right Result Date: 10/19/2024 EXAM: 3 OR MORE VIEW(S) XRAY OF THE RIGHT FOOT 10/19/2024 02:59:49 PM COMPARISON: 08/17/2024 CLINICAL HISTORY: Diabetic ulceration of right great toe. FINDINGS: BONES AND JOINTS: Old healed fracture of the third metatarsal. Erosive changes in the distal tuft of the second distal phalanx, concerning for osteomyelitis. Status post amputation of first digit at the level of the metatarsophalangeal joint. Calcaneal spur noted along the Achilles tendon and Plantar aponeurosis attachment sites. No acute fracture. No malalignment. SOFT TISSUES: Focal soft tissue swelling overlying the first metatarsophalangeal joint. IMPRESSION: 1. Focal soft tissue swelling overlying the first metatarsophalangeal joint. 2. Status post amputation of the first digit at the level of the metatarsophalangeal joint. 3. Erosive changes in the distal tuft of the second distal phalanx, suspicious for  osteomyelitis. Electronically signed by: Elsie Gravely MD 10/19/2024 03:16 PM EST RP Workstation: [id number]    Microbiology: Results for orders placed or performed during the hospital encounter of 10/19/24  Blood culture (routine x 2)     Status: None (Preliminary result)   Collection Time: 10/19/24  4:47 PM   Specimen: BLOOD  Result Value Ref Range Status   Specimen Description BLOOD RIGHT ANTECUBITAL  Final   Special Requests   Final    BOTTLES DRAWN AEROBIC AND ANAEROBIC Blood Culture results may not be optimal due to an inadequate volume of blood received in culture bottles    Culture   Final    NO GROWTH 2 DAYS Performed at [facility], [address], [place], [place] [address]    Report Status PENDING  Incomplete  Blood culture (routine x 2)     Status: None (Preliminary result)   Collection Time: 10/19/24  4:47 PM   Specimen: BLOOD  Result Value Ref Range Status   Specimen Description BLOOD BLOOD RIGHT HAND  Final   Special Requests   Final    BOTTLES DRAWN AEROBIC AND ANAEROBIC Blood Culture results may not be optimal due to an inadequate volume of blood received in culture bottles   Culture   Final    NO GROWTH 2 DAYS Performed at [facility], [address] [place], [place] [address]    Report Status PENDING  Incomplete  Aerobic/Anaerobic Culture w Gram Stain (surgical/deep wound)     Status: None (Preliminary result)   Collection Time: 10/20/24  9:11 AM   Specimen: PATH Digit amputation; Tissue  Result Value Ref Range Status   Specimen Description   Final    TISSUE Performed at [facility], [address] [place], [place] [address]    Special Requests RIGHT TOE 2ND  Final   Gram Stain   Final    FEW WBC PRESENT, PREDOMINANTLY PMN MODERATE GRAM POSITIVE COCCI FEW GRAM POSITIVE RODS RARE GRAM NEGATIVE RODS    Culture   Final    ABUNDANT GRAM NEGATIVE RODS CULTURE REINCUBATED FOR BETTER GROWTH Performed at [facility], [address] [place], [place] [address]    Report Status PENDING  Incomplete    Labs: CBC: Recent Labs  Lab 10/19/24 1438 10/21/24 0448  WBC 5.2 4.1  NEUTROABS 3.2  --   HGB 13.9 13.1  HCT 39.9 37.2*  MCV 85.3 85.7  PLT 189 182   Basic Metabolic Panel: Recent Labs  Lab 10/19/24 1438 10/21/24 0448  NA 143 141  K 3.9 3.9  CL 104 105  CO2 26 25  GLUCOSE 131* 110*  BUN 18 17  CREATININE 0.84 1.09  CALCIUM  9.2 9.1   Liver Function Tests: No results for input(s): AST, ALT, ALKPHOS, BILITOT, PROT, ALBUMIN in the last 168 hours. CBG: Recent Labs  Lab  10/20/24 1155 10/20/24 1708 10/20/24 2057 10/20/24 2114 10/21/24 0732  GLUCAP 83 82 75 88 132*    Discharge time spent: greater than 30 minutes.  Signed: Laree Lock, MD [facility] 10/21/2024 "

## 2024-10-23 LAB — SURGICAL PATHOLOGY

## 2024-10-24 LAB — CULTURE, BLOOD (ROUTINE X 2)
Culture: NO GROWTH
Culture: NO GROWTH

## 2024-10-27 LAB — SUSCEPTIBILITY, AER + ANAEROB

## 2024-10-27 LAB — SUSCEPTIBILITY RESULT

## 2024-10-27 NOTE — Addendum Note (Signed)
 Addendum  created 10/27/24 1717 by Vicci Camellia Glatter, MD   Attestation recorded in [place], Intraprocedure Attestations filed

## 2024-10-28 ENCOUNTER — Ambulatory Visit (INDEPENDENT_AMBULATORY_CARE_PROVIDER_SITE_OTHER): Payer: PRIVATE HEALTH INSURANCE

## 2024-10-28 ENCOUNTER — Ambulatory Visit (INDEPENDENT_AMBULATORY_CARE_PROVIDER_SITE_OTHER): Payer: PRIVATE HEALTH INSURANCE | Admitting: Podiatry

## 2024-10-28 ENCOUNTER — Encounter: Payer: Self-pay | Admitting: Podiatry

## 2024-10-28 VITALS — Ht 71.0 in | Wt 270.0 lb

## 2024-10-28 DIAGNOSIS — M869 Osteomyelitis, unspecified: Secondary | ICD-10-CM

## 2024-10-28 LAB — AEROBIC/ANAEROBIC CULTURE W GRAM STAIN (SURGICAL/DEEP WOUND)

## 2024-10-28 MED ORDER — CIPROFLOXACIN HCL 500 MG PO TABS: 500.0000 mg | ORAL_TABLET | Freq: Two times a day (BID) | ORAL | 0 refills | Status: AC

## 2024-10-28 MED ORDER — AMOXICILLIN-POT CLAVULANATE 875-125 MG PO TABS: 1.0000 | ORAL_TABLET | Freq: Two times a day (BID) | ORAL | 0 refills | Status: DC

## 2024-10-28 NOTE — Progress Notes (Signed)
 "  No chief complaint on file.   Subjective:  Patient presents today status post RT great toe amputation.  Inpatient.  DOS: 08/17/2024.  Recent hospital admission with amputation of the right second toe on 10/20/2024 by Dr. Marolyn Honour.  Presenting for follow-up  Past Medical History:  Diagnosis Date   Azoospermia    Gout    per pt stable as of 03-24-2016   Hypertension    Hypogonadism, testicular    Type 2 diabetes mellitus (HCC)    Wears glasses     Past Surgical History:  Procedure Laterality Date   AMPUTATION TOE Right 08/17/2024   Procedure: AMPUTATION, TOE;  Surgeon: Janit Thresa HERO, DPM;  Location: [facility];  Service: Orthopedics/Podiatry;  Laterality: Right;   AMPUTATION TOE Right 10/20/2024   Procedure: AMPUTATION, TOE;  Surgeon: Honour Marsa FALCON, DPM;  Location: [facility];  Service: Orthopedics/Podiatry;  Laterality: Right;   BREAST REDUCTION SURGERY  age 54 and 54   COLONOSCOPY  2011   GRAFT APPLICATION Right 12/15/2021   Procedure: GRAFT APPLICATION;  Surgeon: Tye Millet, DO;  Location: [facility];  Service: General;  Laterality: Right;   SKIN SPLIT GRAFT Right 03/07/2022   Procedure: SKIN GRAFT SPLIT THICKNESS;  Surgeon: Elisabeth Craig RAMAN, MD;  Location: [facility];  Service: Plastics;  Laterality: Right;   TESTICLE BIOPSY N/A 04/04/2016   Procedure: BIOPSY TESTICULAR;  Surgeon: Donnice Brooks, MD;  Location: [facility];  Service: Urology;  Laterality: N/A;   WOUND DEBRIDEMENT Right 11/02/2021   Procedure: DEBRIDEMENT WOUND;  Surgeon: Tye Millet, DO;  Location: [facility];  Service: General;  Laterality: Right;    No Known Allergies   RT second toe amputation site 10/28/2024  Objective/Physical Exam Neurovascular status intact.  Amputation site to the great toe is healed.  There is some dehiscence with fibrotic macerated tissue to the amputation site of the second toe.  Please see above photo.  Mild malodor.  Component Ref Range  & Units (hover) 9 d ago  Specimen Description TOE Performed at [facility], [address] [place], [place] [address]  Special Requests NONE Performed at [facility], [address] [place], [place] [address]  Gram Stain NO WBC SEEN RARE GRAM POSITIVE COCCI  Culture FEW METHICILLIN RESISTANT STAPHYLOCOCCUS AUREUS WITHMIXED SKIN FLORA NO ANAEROBES ISOLATED Performed at [facility], [address] [place], [place] [address]  Report Status 08/22/2024 FINAL  Organism ID, Bacteria METHICILLIN RESISTANT STAPHYLOCOCCUS AUREUS  Resulting Agency [facility]     Susceptibility   Methicillin resistant staphylococcus aureus    MIC    CIPROFLOXACIN  <=0.5 SENSI... Sensitive    CLINDAMYCIN  <=0.25 SENS... Sensitive    ERYTHROMYCIN >=8 RESISTANT Resistant    GENTAMICIN <=0.5 SENSI... Sensitive    Inducible Clindamycin  NEGATIVE Sensitive    LINEZOLID 2 SENSITIVE Sensitive    OXACILLIN RESISTANT Resistant    RIFAMPIN <=0.5 SENSI... Sensitive    TETRACYCLINE <=1 SENSITIVE Sensitive    TRIMETH/SULFA <=10 SENSIT... Sensitive    VANCOMYCIN  1 SENSITIVE Sensitive           Susceptibility Comments  Methicillin resistant staphylococcus aureus  FEW METHICILLIN RESISTANT STAPHYLOCOCCUS AUREUS      Radiographic Exam RT foot 10/28/2024:  Interval amputation of the right great toe at the level of the MTP as well as the second. No obvious gas in the tissue.   Assessment: 1. s/p right great toe amputation.  Inpatient. DOS: 08/17/2024 2.  S/PE right  second toe amputation.  Inpatient.  DOS: 10/20/2024.  Plan of Care:  -Patient was evaluated.  X-rays reviewed -Based on cultures, prescription for ciprofloxacin  500 mg twice daily x 14 days -Dressings changed.  Supplies provided for Betadine wet-to-dry daily -Minimal WBAT postop shoe -Return to clinic 1 week  *[facility]  Thresa EMERSON Sar, DPM [facility]  Dr. Thresa EMERSON Sar, DPM    [address] [place], [place] [address]                Office [phone]  Fax [phone]      "

## 2024-11-04 ENCOUNTER — Encounter: Payer: PRIVATE HEALTH INSURANCE | Admitting: Podiatry

## 2024-11-07 ENCOUNTER — Encounter: Payer: PRIVATE HEALTH INSURANCE | Admitting: Podiatry
